# Patient Record
Sex: Female | Born: 1935 | Hispanic: No | Smoking: Former smoker
Health system: Southern US, Community
[De-identification: ages and names within clinical notes are randomized; demographics above are authoritative.]

## PROBLEM LIST (undated history)

## (undated) DIAGNOSIS — E079 Disorder of thyroid, unspecified: Secondary | ICD-10-CM

## (undated) DIAGNOSIS — E042 Nontoxic multinodular goiter: Secondary | ICD-10-CM

## (undated) DIAGNOSIS — E785 Hyperlipidemia, unspecified: Secondary | ICD-10-CM

## (undated) DIAGNOSIS — D369 Benign neoplasm, unspecified site: Secondary | ICD-10-CM

## (undated) DIAGNOSIS — Z923 Personal history of irradiation: Secondary | ICD-10-CM

## (undated) DIAGNOSIS — F419 Anxiety disorder, unspecified: Secondary | ICD-10-CM

## (undated) DIAGNOSIS — Z789 Other specified health status: Secondary | ICD-10-CM

## (undated) DIAGNOSIS — R894 Abnormal immunological findings in specimens from other organs, systems and tissues: Secondary | ICD-10-CM

## (undated) DIAGNOSIS — R42 Dizziness and giddiness: Secondary | ICD-10-CM

## (undated) DIAGNOSIS — I2510 Atherosclerotic heart disease of native coronary artery without angina pectoris: Secondary | ICD-10-CM

## (undated) DIAGNOSIS — M7120 Synovial cyst of popliteal space [Baker], unspecified knee: Secondary | ICD-10-CM

## (undated) DIAGNOSIS — M810 Age-related osteoporosis without current pathological fracture: Secondary | ICD-10-CM

## (undated) DIAGNOSIS — C801 Malignant (primary) neoplasm, unspecified: Secondary | ICD-10-CM

## (undated) DIAGNOSIS — N951 Menopausal and female climacteric states: Secondary | ICD-10-CM

## (undated) DIAGNOSIS — M5126 Other intervertebral disc displacement, lumbar region: Secondary | ICD-10-CM

## (undated) DIAGNOSIS — D171 Benign lipomatous neoplasm of skin and subcutaneous tissue of trunk: Secondary | ICD-10-CM

## (undated) DIAGNOSIS — M479 Spondylosis, unspecified: Secondary | ICD-10-CM

## (undated) DIAGNOSIS — I10 Essential (primary) hypertension: Secondary | ICD-10-CM

## (undated) DIAGNOSIS — M519 Unspecified thoracic, thoracolumbar and lumbosacral intervertebral disc disorder: Secondary | ICD-10-CM

## (undated) DIAGNOSIS — J329 Chronic sinusitis, unspecified: Secondary | ICD-10-CM

## (undated) HISTORY — PX: FINGER CONTRACTURE RELEASE: SHX637

## (undated) HISTORY — PX: CORONARY ANGIOPLASTY: SHX604

## (undated) HISTORY — DX: Benign lipomatous neoplasm of skin and subcutaneous tissue of trunk: D17.1

## (undated) HISTORY — PX: OTHER SURGICAL HISTORY: SHX169

## (undated) HISTORY — PX: CARPAL TUNNEL RELEASE: SHX101

## (undated) HISTORY — DX: Abnormal immunological findings in specimens from other organs, systems and tissues: R89.4

## (undated) HISTORY — DX: Menopausal and female climacteric states: N95.1

## (undated) HISTORY — DX: Hyperlipidemia, unspecified: E78.5

## (undated) HISTORY — DX: Benign neoplasm, unspecified site: D36.9

## (undated) HISTORY — DX: Spondylosis, unspecified: M47.9

## (undated) HISTORY — DX: Atherosclerotic heart disease of native coronary artery without angina pectoris: I25.10

## (undated) HISTORY — DX: Other intervertebral disc displacement, lumbar region: M51.26

## (undated) HISTORY — DX: Chronic sinusitis, unspecified: J32.9

## (undated) HISTORY — DX: Age-related osteoporosis without current pathological fracture: M81.0

## (undated) HISTORY — DX: Disorder of thyroid, unspecified: E07.9

## (undated) HISTORY — DX: Synovial cyst of popliteal space (Baker), unspecified knee: M71.20

## (undated) HISTORY — DX: Unspecified thoracic, thoracolumbar and lumbosacral intervertebral disc disorder: M51.9

## (undated) HISTORY — DX: Nontoxic multinodular goiter: E04.2

---

## 1988-05-29 DIAGNOSIS — M5126 Other intervertebral disc displacement, lumbar region: Secondary | ICD-10-CM

## 1988-05-29 DIAGNOSIS — M5136 Other intervertebral disc degeneration, lumbar region: Secondary | ICD-10-CM

## 1988-05-29 DIAGNOSIS — M51369 Other intervertebral disc degeneration, lumbar region without mention of lumbar back pain or lower extremity pain: Secondary | ICD-10-CM

## 1988-05-29 HISTORY — DX: Other intervertebral disc degeneration, lumbar region: M51.36

## 1988-05-29 HISTORY — DX: Other intervertebral disc displacement, lumbar region: M51.26

## 1988-05-29 HISTORY — DX: Other intervertebral disc degeneration, lumbar region without mention of lumbar back pain or lower extremity pain: M51.369

## 2004-11-09 ENCOUNTER — Other Ambulatory Visit: Admission: RE | Admit: 2004-11-09 | Discharge: 2004-11-09 | Payer: Self-pay | Admitting: Obstetrics and Gynecology

## 2005-05-29 DIAGNOSIS — I2510 Atherosclerotic heart disease of native coronary artery without angina pectoris: Secondary | ICD-10-CM

## 2005-05-29 HISTORY — DX: Atherosclerotic heart disease of native coronary artery without angina pectoris: I25.10

## 2005-07-26 ENCOUNTER — Other Ambulatory Visit: Admission: RE | Admit: 2005-07-26 | Discharge: 2005-07-26 | Payer: Self-pay | Admitting: Obstetrics and Gynecology

## 2005-10-19 ENCOUNTER — Emergency Department (HOSPITAL_COMMUNITY): Admission: EM | Admit: 2005-10-19 | Discharge: 2005-10-19 | Payer: Self-pay | Admitting: Emergency Medicine

## 2005-10-19 ENCOUNTER — Inpatient Hospital Stay (HOSPITAL_COMMUNITY): Admission: EM | Admit: 2005-10-19 | Discharge: 2005-10-21 | Payer: Self-pay | Admitting: Emergency Medicine

## 2005-10-25 ENCOUNTER — Inpatient Hospital Stay (HOSPITAL_COMMUNITY): Admission: EM | Admit: 2005-10-25 | Discharge: 2005-10-27 | Payer: Self-pay | Admitting: Emergency Medicine

## 2005-11-14 ENCOUNTER — Other Ambulatory Visit: Admission: RE | Admit: 2005-11-14 | Discharge: 2005-11-14 | Payer: Self-pay | Admitting: Family Medicine

## 2006-05-25 ENCOUNTER — Other Ambulatory Visit: Admission: RE | Admit: 2006-05-25 | Discharge: 2006-05-25 | Payer: Self-pay | Admitting: Obstetrics and Gynecology

## 2006-09-13 ENCOUNTER — Other Ambulatory Visit: Admission: RE | Admit: 2006-09-13 | Discharge: 2006-09-13 | Payer: Self-pay | Admitting: Obstetrics and Gynecology

## 2007-06-18 ENCOUNTER — Encounter: Admission: RE | Admit: 2007-06-18 | Discharge: 2007-06-18 | Payer: Self-pay | Admitting: Surgery

## 2007-10-17 ENCOUNTER — Other Ambulatory Visit: Admission: RE | Admit: 2007-10-17 | Discharge: 2007-10-17 | Payer: Self-pay | Admitting: Obstetrics and Gynecology

## 2008-01-06 ENCOUNTER — Encounter: Admission: RE | Admit: 2008-01-06 | Discharge: 2008-01-06 | Payer: Self-pay | Admitting: Surgery

## 2008-07-08 ENCOUNTER — Encounter: Admission: RE | Admit: 2008-07-08 | Discharge: 2008-07-08 | Payer: Self-pay | Admitting: Surgery

## 2009-01-07 HISTORY — PX: EYE SURGERY: SHX253

## 2009-01-21 HISTORY — PX: EYE SURGERY: SHX253

## 2009-07-05 ENCOUNTER — Encounter: Admission: RE | Admit: 2009-07-05 | Discharge: 2009-07-05 | Payer: Self-pay | Admitting: Family Medicine

## 2009-08-09 ENCOUNTER — Other Ambulatory Visit: Admission: RE | Admit: 2009-08-09 | Discharge: 2009-08-09 | Payer: Self-pay | Admitting: Obstetrics and Gynecology

## 2009-11-12 ENCOUNTER — Encounter: Admission: RE | Admit: 2009-11-12 | Discharge: 2009-11-12 | Payer: Self-pay | Admitting: Obstetrics and Gynecology

## 2010-06-19 ENCOUNTER — Encounter: Payer: Self-pay | Admitting: Cardiology

## 2010-06-19 ENCOUNTER — Encounter: Payer: Self-pay | Admitting: Family Medicine

## 2010-06-24 ENCOUNTER — Other Ambulatory Visit: Payer: Self-pay | Admitting: Family Medicine

## 2010-06-24 DIAGNOSIS — E041 Nontoxic single thyroid nodule: Secondary | ICD-10-CM

## 2010-07-08 ENCOUNTER — Ambulatory Visit
Admission: RE | Admit: 2010-07-08 | Discharge: 2010-07-08 | Disposition: A | Discharge status: Home / Self Care | Payer: MEDICARE | Source: Ambulatory Visit | Attending: Family Medicine | Admitting: Family Medicine

## 2010-07-08 DIAGNOSIS — E041 Nontoxic single thyroid nodule: Secondary | ICD-10-CM

## 2010-08-31 ENCOUNTER — Inpatient Hospital Stay (INDEPENDENT_AMBULATORY_CARE_PROVIDER_SITE_OTHER)
Admission: RE | Admit: 2010-08-31 | Discharge: 2010-08-31 | Disposition: A | Discharge status: Home / Self Care | Payer: Medicare Other | Source: Ambulatory Visit | Attending: Family Medicine | Admitting: Family Medicine

## 2010-08-31 DIAGNOSIS — L98499 Non-pressure chronic ulcer of skin of other sites with unspecified severity: Secondary | ICD-10-CM

## 2010-10-14 NOTE — H&P (Signed)
NAME:  Margaret Hale, Margaret Hale           ACCOUNT NO.:  [id number]   MEDICAL RECORD NO.:  [id number]           PATIENT TYPE:   LOCATION:                               FACILITY:  [facility]   PHYSICIAN:  Armanda Magic, M.D.     DATE OF BIRTH:  1935/03/23   DATE OF ADMISSION:  10/19/2005  DATE OF DISCHARGE:                                HISTORY & PHYSICAL   CHIEF COMPLAINT:  Chest pain.   HISTORY OF PRESENT ILLNESS:  Margaret Hale is a 76 year old Caucasian  female with a history of hypertension and no known cardiac history.  She  presented to the [facility] Cardiology office in April per the referral of Dr.  Laurann Montana for nonexertional midsternal chest pain that lasted for 10-15  minutes.  The pain was described as a deep ache that radiated from deep  within her bones to her throat.  There were no other associated symptoms  with the chest pain in that the patient denied shortness of breath, nausea,  vomiting, and diaphoresis.  A stress Cardiolite was performed, yielding  abnormal results.  The patient continued to have intermittent chest pain and  presented back to the [facility] Cardiology office on today complaining of  intermittent midsternal chest pain occurring mainly at night, lasting for 1  to 1-1/2 hours.  The chest pain was similar in quality and character as the  previous pain, with a longer duration.  She was actively having chest pain  while in the office today and admitted to having eaten a meal.  The patient  was referred to the [facility] Emergency Department for admittance to the  hospital for precardiac catheterization blood work and a subsequent  inpatient cardiac catheterization that has been scheduled for the a.m.  The  patient, upon arrival to the emergency department, admitted to being chest  pain free; however, she also admitted that her chest pain is still  intermittent.  She denied shortness of breath, nausea, vomiting, as well as  diaphoresis.  The patient has had symptoms of  diaphoresis over the past few  months; however, she believes it is the result of the discontinuation of the  estrogen that she discontinued in January of this year.  A 2-D echo in May  2007 revealed normal LV systolic function and mild LVH, mild PR, and mild  left atrial enlargement.   PAST MEDICAL HISTORY:  1.  Hypertension.  2.  Tobacco abuse.   ALLERGIES:  No known drug allergies.   MEDICATIONS:  1.  Norvasc 10 mg daily.  2.  Avelox 300/12.5 mg daily.  3.  Calcium 500 mg three times daily.   PAST SURGICAL HISTORY:  None.   FAMILY HISTORY:  1.  Father deceased, old age.  2.  Mother deceased when the patient was very young from an infection.  3.  The patient has no siblings.  4.  Two children - both are healthy.   SOCIAL HISTORY:  Margaret Hale is a widow and has two adult children, both  of whom are healthy.  The patient does not work.  She admits to tobacco use  in  that she smokes one half pack of cigarettes per day and has for an  unknown amount of yearsand has caffeine intake.   REVIEW OF SYSTEMS:  All other systems reviewed are negative other than what  is stated in the HPI.   PHYSICAL EXAMINATION:  GENERAL:  A 76 year old female, pleasant and  cooperative, no acute distress.  VITAL SIGNS:  Temperature 97.6, pulse 66, blood pressure 135/64,  respirations 18, O2 saturations 95% over room air.  HEENT:  Unremarkable.  NECK:  Supple without JVD or carotid bruits bilaterally.  Carotid upstrokes.  No masses.  No aortic or iliac bruits.  EXTREMITIES:  No peripheral edema.  DP and PT pulses 2+/2, bilaterally.  SKIN:  Warm and dry without rashes or lesions.  NEUROLOGIC:  Alert and oriented x3.  No focal deficits.  PSYCHIATRIC:  Normal mood and affect.   LABORATORY DATA:  BMET, CBC with differential, PT, INR, PTT, magnesium,  point of care enzymes x1, cardiac enzymes are pending.   EKG; 80 beats per minute without ST or T-wave abnormalities.   ASSESSMENT:  1.  Chest  pain, intermittent.  2.  Abnormal stress Cardiolite.  3.  Under the service of Dr. Armanda Magic with a diagnosis of chest pain.  4.  Start SQ Lovenox every 12 hours per pharmacy protocol.  5.  Start IV nitroglycerin at 10 mcg per minute.  Titrate for chest pain.      Hold for SBP less than 110 mmHg.  6.  Initiate cardiac p.r.n. orders.  7.  Cardiac catheterization in the a.m. at 10:30.  Precardiac      catheterization lab work is pending.  Precardiac catheterization orders      have been written and placed in the chart.  Cardiac catheterization      procedure, risks, and potential complications including MI, CVA, death,      contrast dye allergy, decreased renal function within the first 48      hours, or pseudoaneurysm were explained to the patient by Dr. Mayford Knife.      The patient admits to full understanding of the information and wishes      to proceed with the cardiac catheterization procedure as scheduled at      10:30 a.m. in the morning.      Tylene Fantasia, [place]      Armanda Magic, M.D.  Electronically Signed    RDM/MEDQ  D:  10/19/2005  T:  10/19/2005  Job:  [id number]   cc:   Stacie Acres. Cliffton Asters, M.D.  Fax: [phone]

## 2010-10-14 NOTE — Consult Note (Signed)
Margaret Hale, Margaret Hale           ACCOUNT NO.:  [id number]   MEDICAL RECORD NO.:  [id number]          PATIENT TYPE:  INP   LOCATION:  4742                         FACILITY:  [facility]   PHYSICIAN:  James L. Malon Kindle., M.D.DATE OF BIRTH:  22-Apr-1935   DATE OF CONSULTATION:  10/26/2005  DATE OF DISCHARGE:                                   CONSULTATION   GASTROENTEROLOGY CONSULTATION   REASON FOR CONSULTATION:  Chest pain felt to be noncardiac.   HISTORY OF PRESENT ILLNESS:  The patient is a 76 year old woman who has had  no real history of coronary disease until recently.  As an outpatient she  had complained of a vague substernal chest pain, a pressure-like pain, and  had a positive Cardiolite test.  She had an outpatient catheterization  showing right coronary artery stenosis and a bare metal stent was placed.  This was on Oct 19, 2005.  She then was on Plavix for three weeks.  She was  discharged home after this and yesterday she developed fairly sudden onset  of left sided chest pain.  She describes this as aching, deep pain.  It  appears it was in her left chest wall and sometimes anteriorly, sometimes  posteriorly, sometimes it seemed to start and sometimes from the back.  Careful history taken showed that she had no pleuritic component to it, no  worsening with taking a deep breath, moving, twisting, laying down, standing  up, walking, etc.  It just became steadily worse and she came to the  hospital. The possibility of pulmonary embolus was raised and CT scan  angiogram of the chest was performed.  A small nodule in the left lung field  had been previously seen on chest x-ray.  A CT scan showed no signs at all  of a pulmonary embolus with what appeared to be old granulomatous disease  with atherosclerosis.  The right lobe of the thyroid was enlarged but there  were no findings to explain the patient's chest pain.  She denies any  problem with eating. She says the pain is there  constantly.  She eats a  meal, drinks hot and cold liquids, just finished her dinner tonight with no  worsening of the pain.  She has no nausea.  She has not vomited.  She has no  heartburn, no indigestion.  Her bowel movements do not make the pain better  or worse.  The pain does not radiate down into her abdomen.  She is due for  gallbladder ultrasound tomorrow.   Other pertinent tests on this admission have revealed blood gas with venous  sample with pCO2 of 44 and a normal pH.  Hemoglobin and white blood cell  count were normal.  Pro Time also was normal.  The patient's labs revealed  normal liver function tests, normal cardiac markers.  Her electrocardiogram  was unchanged and the feeling is that this is not a cardiac chest pain.   CURRENT MEDICATIONS:  1.  Aspirin 325 mg daily.  2.  Plavix 75 mg daily.  3.  Zocor 20 daily.  4.  Colace 200 daily.  5.  Avapro 300 daily.  6.  Hydrochlorothiazide 12.5 daily.  7.  Norvasc 10 daily.  8.  Calcium 500 t.i.d.  9.  Lovenox 75 q.12h. per Lovenox protocol.  10. Nitroglycerin.  11. Intravenous morphine.  12. Tylenol.  13. Senokot.   ALLERGIES:  The patient has no drug allergies.   PAST MEDICAL HISTORY:  Her medical history is remarkable only for  hypertension.  She has no other chronic problems.   PAST SURGICAL HISTORY:  She has never had any surgery.   FAMILY HISTORY:  Her mother died of some sort of infection right after the  war.  She did not really see her, was separated from her mother and father  during World War II in [place] and did not see her father after that.  He was  missing for a while.  She lived with relatives and he reappeared in another  part of the country but she is not really aware of his medical history.  She  has a son in [place] who is healthy.   SOCIAL HISTORY:  The patient is widowed.  She was married for a number of  years.  Her husband died about 9 years ago and she started smoking.  She  denied  smoking up until that time.  She quit five days ago.  She does not  drink.  She was originally from [place].  She lived around with different  relatives during World War II and eventually was able to immigrate here.  She has a son who lives here in [place].  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   PHYSICAL EXAMINATION:  VITAL SIGNS:  Patient is afebrile.  Vital signs are  normal.  GENERAL APPEARANCE:  Oriented x3 white female in no acute distress.  HEENT:  Eyes clear, nonicteric.  LUNGS:  Clear anteriorly and posteriorly.  I carefully examined the left  chest wall and there is no signs of a vesicular rash.  The area of the pain  appears to be around the left chest wall from the front to the back.  I hear  no rubs in this area.  HEART:  Regular rate and rhythm without murmurs or gallops.  ABDOMEN:  Soft, nontender.   ASSESSMENT:  This pain is very atypical for any gastrointestinal pain.  The  patient has no heartburn, no indigestion, is able to eat and swallow without  any increase in her pain.  She has had no change in her bowel movements.  I  wonder if this in some way is neurological.   PLAN:  Agree with gallbladder ultrasound.  If this is negative we may  consider an endoscopy or something else, however, I would at this point go  ahead and place her empirically on proton pump inhibitor's b.i.d. and see if  that helps.  Dr. Matthias Hughs will check on the results of the test tomorrow.           ______________________________  Llana Aliment Malon Kindle., M.D.     Waldron Session  D:  10/26/2005  T:  10/27/2005  Job:  [id number]   cc:   Stacie Acres. Cliffton Asters, M.D.  Fax: [phone]

## 2010-10-14 NOTE — H&P (Signed)
NAME:  Margaret Hale, Margaret Hale NO.:  [id number]   MEDICAL RECORD NO.:  [id number]          PATIENT TYPE:  EMS   LOCATION:  MAJO                         FACILITY:  [facility]   PHYSICIAN:  Meade Maw, M.D.    DATE OF BIRTH:  [birth year]   DATE OF ADMISSION:  10/25/2005  DATE OF DISCHARGE:                                HISTORY & PHYSICAL   CHIEF COMPLAINT:  Chest pain.   HISTORY OF PRESENT ILLNESS:  Margaret Hale is a 76 year old female who is  admitted on Oct 19, 2005, for cardiac catheterization.  As an outpatient,  she complained of substernal chest pain and had a positive Cardiolite which  lead to her catheterization on Oct 19, 2005.  She was found to have a high  RCA stenosis and a bare metal stent was placed to this area with good  results.  It is unclear to me at this time the reason for the bare metal  stent use, however, the end result was excellent.  The patient was asked to  remain on Plavix for a total of three weeks.   Today, she developed left sided chest pain squeezing.  She did have some  radiation into her left shoulder but, otherwise, no associated symptoms.  She specifically tells me that this pain is different from her previous  angina with her previous admission.  She states that pain was mid sternal.   PAST MEDICAL HISTORY:  Hypertension.   SOCIAL HISTORY:  She quit smoking five days ago.  She is a widow.  She has  two children.  She is from [place].   FAMILY HISTORY:  Father died of old age.  Mother died of some type of  infection.   ALLERGIES:  No known drug allergies.   MEDICATIONS:  Plavix 75 mg daily, Zocor 20 mg daily, Norvasc 10 mg daily,  Avalide 300/12.5 mg a day.  She tells me she did not start her aspirin as  indicated on discharge because she thought her Plavix bottle said not to mix  it with aspirin.   PHYSICAL EXAMINATION:  VITAL SIGNS:  Temperature 97.6, blood pressure 123/68, pulse 61,  respirations 20.  HEENT:  Within normal  limits.  Sclerae clear.  Conjunctivae normal.  Nares  without drainage.  NECK:  No carotid bruit or subclavian bruits, no JVD, no thyromegaly.  CHEST:  Clear to auscultation bilaterally, no wheezing or rhonchi.  HEART:  Regular rate and rhythm, no gross murmur.  There is no left chest  wall tenderness.  ABDOMEN:  Soft, good bowel sounds, nontender, nondistended, no masses, no  bruits.  EXTREMITIES:  No femoral bruits, no lower extremity edema, palpable PT  pulses bilaterally.  SKIN:  Warm and dry.  NEUROLOGICAL:  Cranial nerves 2-12 grossly intact.  Alert and oriented x 3.   LABORATORY DATA:  Sodium 138, potassium 4, BUN 30, creatinine 0.9.  Hemoglobin 13.1, hematocrit 37.8, white count 6.  Point of care markers  negative x1.  Protime 13.5, INR 1.  EKG normal sinus rhythm, rate at 74, non-  acute.   ASSESSMENT AND PLAN:  1.  Chest pain.  2.  Known coronary artery disease status post recent bare metal stent to the      right coronary artery on Oct 19, 2005, under the care of Dr. Verdis Prime.  3.  8 mm right middle lobe nodule.  She is to have an outpatient CT scan      today but did not make it to the appointment secondary to her chest      pain.  4.  Hypertension, treated.  5.  Hyperlipidemia, treated.  6.  Former tobacco use, but she only quit five days ago, therefore, we will      go ahead and order a smoking cessation consult.   At this point, we will admit her overnight, check serial enzymes and serial  EKGs.  She was not on a beta blocker prior to admission and at this point,  there is no indication to begin a beta blocker at this time.  Because of her  chest pain, however, we will go ahead and perform a CT of the chest to  evaluate for pulmonary embolism and also to evaluate the right lung nodule.  The patient was seen and examined, assessment and plan was performed by Dr.  Meade Maw.      Guy Franco, P.A.      Meade Maw, M.D.  Electronically  Signed    LB/MEDQ  D:  10/25/2005  T:  10/25/2005  Job:  [id number]

## 2010-10-14 NOTE — Discharge Summary (Signed)
Margaret Hale, Margaret Hale NO.:  [id number]   MEDICAL RECORD NO.:  [id number]          PATIENT TYPE:  INP   LOCATION:  4742                         FACILITY:  [facility]   PHYSICIAN:  Meade Maw, M.D.    DATE OF BIRTH:  04/01/1935   DATE OF ADMISSION:  10/25/2005  DATE OF DISCHARGE:  10/27/2005                                 DISCHARGE SUMMARY   DISCHARGE DIAGNOSES:  1.  Chest pain, Coronary artery  disease status post bare metal stent to the      right coronary artery Oct 19, 2005.  2.  Hypertension.  3.  Former smoker.  4.  Right thyroid nodule with normal TSH.  5.  Long-term medication use.   HOSPITAL COURSE:  Margaret Hale is a 76 year old female who underwent bare  metal stent placement to the right coronary artery on Oct 15, 2005.  She was  to remain on Plavix for three weeks.  She had not been as directed on her  discharge instruction sheet because she said the Plavix bottle said not to  mix with aspirin.  She was admitted with a left-sided squeezing sensation  that was dissimilar to her previous coronary pain.   She was admitted to [facility].  Her cardiac enzymes  were negative and her EKG was nonacute.   She does have a known 8 mm right middle lobe nodule by chest x-ray and she  had an appointment for outpatient CT scan on the day of admission but of  course, missed this appointment secondary to her hospitalization.  While she  was here, we did do a CT of the chest and the skin showed no pulmonary  embolism and no sign of nodule in her chest.  I did review this with the  radiologist.  She does have a right thyroid lobe nodule/enlargement.  She  states that she has had thyroid problems in the past but is not on  medications.  As part of her work-up, we did obtain a gastroenterology  consult through the efforts of Dr. Carman Ching.  Gallbladder ultrasound  was ordered and was normal.  Recommendations were to place the patient on an  empiric PPI b.i.d. and to have the patient follow up with Dr. Randa Evens as an  outpatient.   On October 27, 2005, she was discharged to home in stable condition.  I had  given her approximately 24 hours worth of IV Toradol which seemed to  alleviate her pain.  She was discharged to home in stable condition on the  following medications.   DISCHARGE MEDICATIONS:  1.  Enteric coated aspirin 325 mg a day.  2.  Plavix 75 mg a day for a total of three weeks then as previously      instructed.  3.  Zocor 20 mg nightly.  4.  Avalide 300/12.5 mg daily.  5.  Norvasc10 mg a day.  6.  Her new prescription is Protonix 40 mg p.o. b.i.d. and I have given her      three refills for this.   She is to increase her activity slowly, remain on  a low fat diet, follow up  with Armanda Magic, M.D., in two weeks.  I will make this appointment for  her.  She has a follow-up appointment to see Dr. Carman Ching on November 22, 2005, at 9 a.m.   ADDENDUM:  The patient will need to follow up with Dr. Cliffton Asters regarding her  thyroid nodule.      Guy Franco, P.A.      Meade Maw, M.D.  Electronically Signed    LB/MEDQ  D:  10/27/2005  T:  10/27/2005  Job:  [id number]   cc:   Stacie Acres. Cliffton Asters, M.D.  Fax: [phone]   Armanda Magic, M.D.  Fax: [phone]   Llana Aliment. Malon Kindle., M.D.  Fax: [phone]

## 2010-10-14 NOTE — Cardiovascular Report (Signed)
NAME:  Margaret Hale, CALE NO.:  [id number]   MEDICAL RECORD NO.:  [id number]          PATIENT TYPE:  INP   LOCATION:  6523                         FACILITY:  [facility]   PHYSICIAN:  Lyn Records, M.D.   DATE OF BIRTH:  1934/06/18   DATE OF PROCEDURE:  10/20/2005  DATE OF DISCHARGE:                              CARDIAC CATHETERIZATION   INDICATION:  The patient has been having class IV angina (angina at rest),  cardiac catheterization this morning demonstrated a borderline ostial left  main and a high-grade mid right coronary lesion.  I was consulted to do  intravascular ultrasound on the left main and if it demonstrated to be a  hemodynamically insignificant lesion to then stent the right coronary.   PROCEDURE PERFORMED:  1.  Left main intravascular ultrasound.  2.  Bare metal stent mid right coronary.   DESCRIPTION:  Dr. Mayford Knife cath'd the patient this morning.  Using the same  sheath that was placed by her we used a 6-French left Judkins guide  catheter, gave 50 units per kg of IV heparin, documented an ACT greater than  300 and performed left main intravascular ultrasound.  We used an Conservator, museum/gallery through this guide catheter.  We gave 200 mcg of  intracoronary nitroglycerin.  We used an Atlantis Pro intravascular  ultrasound catheter and were able to demonstrate that the left main percent  diameter stenosis was 35% and cross-sectional area stenosis was 51%.  We  felt that this was an insignificant left main stenosis and proceeded with  right coronary PCI.   We gave a double bolus followed by an infusion of Integrilin.  The ACT had  been previously documented to be greater than 300.  We used a Judkins right  6-French guide catheter.  The same [facility] guide wire was used to  obtain access across the stenosis in the right coronary.  We predilated with  a 2.5 x 12 mm long Maverick balloon and we deployed a 16 x 3.5 Liberte  stent.  The stent  moved slightly during the deployment and I was not happy  that we had captured the entire lesion distal margin.  We then placed a  second overlapping 12 mm long x 3.5 mm Liberte stent.  We postdilated with a  15 x 4.0 Quantum balloon.  Two balloon inflations at 12 atmospheres were  performed.  The patient tolerated the procedure without complications.   Angio-Seal arteriotomy closure was performed without complications.  Angiographic demonstration of appropriate anatomy was done prior to  deployment.   CONCLUSIONS:  1.  Mild to moderate left main disease with diameter stenosis of 35%      documented by intravascular ultrasound.  2.  Successful bare metal stent of the mid right coronary with reduction in      stenosis from 99% to less than 10% with TIMI grade 3 flow.  There are      two overlapping 3.5 Liberte stents placed.   PLAN:  1.  Plavix for at least 3 weeks.  2.  Continued Integrilin x18 hours.  Remainder of care per  Dr. Armanda Magic.      Lyn Records, M.D.  Electronically Signed     HWS/MEDQ  D:  10/20/2005  T:  10/20/2005  Job:  [id number]   cc:   Armanda Magic, M.D.  Fax: [phone]   Stacie Acres. Cliffton Asters, M.D.  Fax: [phone]

## 2010-10-14 NOTE — Discharge Summary (Signed)
NAME:  Margaret Hale, Margaret Hale           ACCOUNT NO.:  [id number]   MEDICAL RECORD NO.:  [id number]          PATIENT TYPE:  INP   LOCATION:  6523                         FACILITY:  [facility]   PHYSICIAN:  Armanda Magic, M.D.     DATE OF BIRTH:  1935-03-25   DATE OF ADMISSION:  10/19/2005  DATE OF DISCHARGE:  10/21/2005                                 DISCHARGE SUMMARY   ADMISSION DIAGNOSIS:  Class IV angina at rest.   DISCHARGE DIAGNOSES:  1.  Class IV angina at rest status post cardiac catheterization with a bare-      metal stent to the mid right coronary artery with a reduction in      stenosis from 99% to less than 10% with TIMI grade III flow.  2.  Hypertension.  3.  Tobacco abuse with smoking cessation recommended.  4.  Chronic obstructive pulmonary disease with a 8-mm nodular right density      in the mid lung.  Outpatient CT of the chest pending for further      evaluation of lung nodule.  5.  Hyperlipidemia.   PROCEDURE:  Elective left heart diagnostic cardiac catheterization and  coronary angiography with left ventriculography on Oct 20, 2005, with the  following results:  40%-60% stenosis in the left main, a 30%-40% in the  first diagonal, a 50% lesion in the LAD with systolic compression and 95%  stenotic lesion in the RCA.  Also on Oct 20, 2005, Dr. Verdis Prime was  consulted to do intravascular ultrasound on the left main and if it was  demonstrated to be a hemodynamically insignificant lesion to then stent the  right coronary artery.  There was mild-to-moderate left main disease with a  diameter stenosis of 35%, documented by intravascular ultrasound as a result  the patient was found to have single-vessel coronary artery disease status  post successful bare-metal stenting of the mid right coronary artery with  reduction in stenosis from 99 to less than 10% with TIMI grade III flow.  There are two overlapping 3.5 liberty stents placed.  The plan for the  patient was to place  patient on Plavix 75 mg daily for at least 3 weeks,  Integrilin for 18 hours, and have the remainder of the patient's care per  Dr. Armanda Magic.  The patient tolerated the procedure without  complications.  Angio-Seal arteriotomy closure was performed without  complications.  Angiographic demonstration of appropriate anatomy was done  prior to deployment.   HOSPITAL COURSE:  Margaret Hale is a 76 year old Caucasian female with a  history of hypertension and no known cardiac history.  She was admitted to  the hospital for class IV angina at rest status post an abnormal stress  Cardiolite.  The patient denied any other associated symptoms such as  shortness of breath, nausea, vomiting, and diaphoresis.  She was admitted to  the [facility] for elective diagnostic cardiac catheterization,  which was scheduled to be performed on Oct 20, 2005.  Upon admission,  laboratory data was obtained.  For example, point of care cardiac enzymes  were obtained and were negative x3  with a peak troponin of less than 0.05.  Serial cardiac enzymes were obtained with a peak troponin of 0.03.  On  admission EKG revealed normal sinus rhythm with a ventricular rate of 62  beats per minute with nonspecific abnormality.  There was no old tracing in  which to compare this EKG.  Portable chest x-ray revealed emphysema with  nodular opacity in the right mid lung a chest CT was recommended for further  evaluation of the right lung mass.  The patient believed that her chest pain  was secondary to hormones that she had been taking for years and had  recently discontinued; however, the diagnostic cardiac catheterization  revealed a 90% stenotic lesion in the mid RCA with nonobstructive coronary  disease in the left main, LAD, and first diagonal coronary arteries.  Dr.  Verdis Prime was consulted to perform a left main intravascular ultrasound,  which revealed mild-to-moderate left main disease with a diameter of  35%  stenosis documented by IVUS.  As a result, the patient was found to have  single-vessel coronary artery disease with successful bare-metal stenting of  the mid right coronary artery with reduction in the stenosis from 99% to  less than 10% with TIMI grade III flow.  There was two overlapping 3.5  liberty stents placed.  The patient was started on Plavix 75 mg orally, per  day and will be continued for at least the next 3 weeks.  The patient was  continued on Integrilin for 18 hours.  The procedure was tolerated well by  the patient with minimal blood loss.  A smoking cessation consult was  obtained.  The remainder of the patient's stay was uneventful in that the  patient had no further complaints of chest pain during this admission.  She  was discharged to home in stable condition on Oct 21, 2005, on both Aspirin  and Plavix.   LABORATORY DATA:  Sodium 141, potassium 3.5, chloride 106, CO2 29, glucose  100, BUN 14, creatinine 0.7, calcium 9.4, FLP:  Total cholesterol 173,  triclyceride 285, HDL 36, LDL 80.  Point of care cardiac enzymes are as  follow:  Myoglobin 84.5, 68.1, 186, respectively; CK-MB 1.7, 1.7, and 2.5,  respectively; troponin I less than 0.05, and less than 0.05, respectively,  BNP 40.0.  Serial cardiac enzymes:  CK total 109, 79, 70, respectively; CK-  MB 2.6, 1.8, and 1.7, respectively; troponin I 0.02 and 0.03, respectively.  White blood count 6.1, hemoglobin 13.2, hematocrit 38.3, and platelets 187.  PT 12.7, INR 0.9, PTT 27.   X-RAYS:  Chest x-rays, single-view, Oct 19, 2005, emphysema with a nodular  opacity in the right mid lung.  Recommend chest CT for further evaluation.   EKG:  1.  EKG, Oct 19, 2005, normal sinus rhythm with a ventricular rate of 62      beats per minute with nonspecific ST abnormality.  2.  EKG, Oct 20, 2005, sinus bradycardia with a ventricular rate of 52 beats     per minute with nonspecific ST abnormality.  This represents no       significant change when compared with the EKG on Oct 19, 2005.   CONDITION ON DISCHARGE:  Margaret Hale was discharged to home in stable  condition.  She ambulated prior to discharge without any complaint of chest  pain, shortness of breath, nausea, vomiting, dizziness, diaphoresis or  syncope.  Her groin was in stable condition without bleeding, oozing,  hematoma, or pseudoaneurysm.  DISCHARGE MEDICATIONS:  1.  Plavix 75 mg for 30 days, this represented a new prescription and a      prescription was given with refills.  2.  Coated Aspirin 325 mg daily, this represented a new medication for Mrs.      Gatlin.  3.  Norvasc 10 mg daily.  4.  Avalox 300/12. 5 mg daily.  5.  Calcium 500 mg three times daily.  6.  Zocor 20 mg 1 tablet daily, this represented a new prescription and a      prescription was given with refills.   DISCHARGE INSTRUCTIONS:  1.  The patient was instructed to follow a heart-healthy diet which included      low-salt, low-fat and a low-cholesterol diet.  2.  The patient was instructed to avoid driving for 48 hours.  The patient      was also instructed to avoid lifting greater than 10 pounds for 1 week.  3.  The patient was instructed to call Dr. Armanda Magic, with recurrent      chest pain.   FOLLOWUP:  1.  The patient was scheduled for a chest CT with contrast for further      evaluation of the lung mass at [facility] on Oct 25, 2005, at      2:30 p.m.  She was scheduled to call [phone], if she was unable to make      the scheduled appointment.  She was advised to avoid solid foods after      10:30 a.m. on the day of the chest CT      which was Oct 25, 2005.  She was also advised to call [facility]      in regard to insurance coverage.  2.  The patient was scheduled to follow up with Dr. Armanda Magic, at the      [facility] on November 06, 2005, at 1: 30 p.m.  She was advised      to call [phone], if she was unable to make the  scheduled appointment.      Tylene Fantasia, [place]      Armanda Magic, M.D.  Electronically Signed    RDM/MEDQ  D:  11/27/2005  T:  11/27/2005  Job:  [id number]   cc:   Armanda Magic, M.D.  Stac[place] Cliffton Asters, M.D.

## 2011-05-22 ENCOUNTER — Encounter: Payer: Self-pay | Admitting: *Deleted

## 2011-05-22 ENCOUNTER — Emergency Department (HOSPITAL_COMMUNITY)
Admission: EM | Admit: 2011-05-22 | Discharge: 2011-05-22 | Disposition: A | Discharge status: Home / Self Care | Payer: Medicare Other | Source: Home / Self Care | Attending: Emergency Medicine | Admitting: Emergency Medicine

## 2011-05-22 DIAGNOSIS — M7120 Synovial cyst of popliteal space [Baker], unspecified knee: Secondary | ICD-10-CM

## 2011-05-22 HISTORY — DX: Essential (primary) hypertension: I10

## 2011-05-22 MED ORDER — HYDROCODONE-IBUPROFEN 7.5-200 MG PO TABS: 1.0000 | ORAL_TABLET | Freq: Four times a day (QID) | ORAL | Status: AC | PRN

## 2011-05-22 NOTE — ED Provider Notes (Signed)
History     CSN: [id number]  Arrival date & time 05/22/11  1135   First MD Initiated Contact with Patient 05/22/11 1351      Chief Complaint  Patient presents with  . Joint Swelling    (Consider location/radiation/quality/duration/timing/severity/associated sxs/prior treatment) HPI Comments: Went to see my orthopedic doctor, they have told me to do surgery, and that my knee keeps licking fluid"..its still sore and hurting"  Patient is a 76 y.o. female presenting with knee pain. The history is provided by the patient.  Knee Pain This is a recurrent problem. The current episode started more than 1 week ago. The problem occurs constantly. The problem has been gradually worsening. Pertinent negatives include no headaches. The symptoms are aggravated by bending, standing and walking. The symptoms are relieved by heat. She has tried nothing for the symptoms. The treatment provided no relief.    Past Medical History  Diagnosis Date  . Hypertension     History reviewed. No pertinent past surgical history.  History reviewed. No pertinent family history.  History  Substance Use Topics  . Smoking status: Not on file  . Smokeless tobacco: Not on file  . Alcohol Use:     OB History    Grav Para Term Preterm Abortions TAB SAB Ect Mult Living                  Review of Systems  Constitutional: Negative for fever.  Musculoskeletal: Positive for joint swelling.  Neurological: Negative for weakness, numbness and headaches.    Allergies  Review of patient's allergies indicates not on file.  Home Medications   Current Outpatient Rx  Name Route Sig Dispense Refill  . AMLODIPINE BESYLATE 10 MG PO TABS Oral Take 10 mg by mouth daily.      . ASPIRIN 81 MG PO TABS Oral Take 81 mg by mouth daily.      Marland Kitchen CLONIDINE HCL 0.1 MG PO TABS Oral Take 0.1 mg by mouth 2 (two) times daily.      Marland Kitchen LOSARTAN POTASSIUM-HCTZ 100-12.5 MG PO TABS Oral Take 1 tablet by mouth daily.      Marland Kitchen  HYDROCODONE-IBUPROFEN 7.5-200 MG PO TABS Oral Take 1 tablet by mouth every 6 (six) hours as needed for pain. 30 tablet 0    BP 162/72  Pulse 66  Temp(Src) 97.8 F (36.6 C) (Oral)  Resp 20  SpO2 99%  Physical Exam  Nursing note and vitals reviewed. Constitutional: She appears well-developed and well-nourished.  Musculoskeletal: She exhibits tenderness.       Right shoulder: She exhibits tenderness and swelling.       Left knee: She exhibits decreased range of motion and swelling. She exhibits no effusion, no ecchymosis, no laceration and no erythema. tenderness found.       Legs: Skin: Skin is warm. No rash noted. She is not diaphoretic. No erythema.    ED Course  Procedures (including critical care time)  Labs Reviewed - No data to display No results found.   1. Baker's cyst of knee       MDM  L posterior L knee- known recurrent bakers cyst pains and previous aspirations and local injections- seen last week by ortho.        Jimmie Molly, MD 05/22/11 [phone]

## 2011-05-22 NOTE — ED Notes (Signed)
Pt  Has    Swelling   Behind  l  Knee          Seen           By  [facility]thoped[facility]   Last  Week  And       Was rx   Prednisone  Which  She  Has  Completed      She  Has  Pain l  Leg  And  Behind  The  l  Knee   She  Has  Had  Fluid  Behind  The  Knee  In past   She  denys  specefic  Recent  injury

## 2011-06-13 ENCOUNTER — Other Ambulatory Visit: Payer: Self-pay | Admitting: Family Medicine

## 2011-06-13 ENCOUNTER — Other Ambulatory Visit: Payer: Self-pay | Admitting: Obstetrics and Gynecology

## 2011-06-13 DIAGNOSIS — Z1231 Encounter for screening mammogram for malignant neoplasm of breast: Secondary | ICD-10-CM

## 2011-06-13 DIAGNOSIS — E049 Nontoxic goiter, unspecified: Secondary | ICD-10-CM

## 2011-06-21 ENCOUNTER — Ambulatory Visit
Admission: RE | Admit: 2011-06-21 | Discharge: 2011-06-21 | Disposition: A | Discharge status: Home / Self Care | Payer: Medicare Other | Source: Ambulatory Visit | Attending: Family Medicine | Admitting: Family Medicine

## 2011-06-21 ENCOUNTER — Ambulatory Visit
Admission: RE | Admit: 2011-06-21 | Discharge: 2011-06-21 | Disposition: A | Discharge status: Home / Self Care | Payer: Medicare Other | Source: Ambulatory Visit | Attending: Obstetrics and Gynecology | Admitting: Obstetrics and Gynecology

## 2011-06-21 ENCOUNTER — Other Ambulatory Visit: Payer: Self-pay | Admitting: Family Medicine

## 2011-06-21 ENCOUNTER — Other Ambulatory Visit: Payer: Medicare Other

## 2011-06-21 ENCOUNTER — Other Ambulatory Visit: Payer: Self-pay | Admitting: Gastroenterology

## 2011-06-21 DIAGNOSIS — Z1231 Encounter for screening mammogram for malignant neoplasm of breast: Secondary | ICD-10-CM

## 2011-06-21 DIAGNOSIS — E049 Nontoxic goiter, unspecified: Secondary | ICD-10-CM

## 2011-06-22 ENCOUNTER — Other Ambulatory Visit: Payer: Self-pay | Admitting: Family Medicine

## 2011-06-22 ENCOUNTER — Ambulatory Visit: Payer: Medicare Other

## 2011-06-22 DIAGNOSIS — R9389 Abnormal findings on diagnostic imaging of other specified body structures: Secondary | ICD-10-CM

## 2011-06-23 ENCOUNTER — Other Ambulatory Visit: Payer: Self-pay | Admitting: Obstetrics and Gynecology

## 2011-06-23 DIAGNOSIS — R928 Other abnormal and inconclusive findings on diagnostic imaging of breast: Secondary | ICD-10-CM

## 2011-06-27 ENCOUNTER — Other Ambulatory Visit: Payer: Medicare Other

## 2011-06-28 ENCOUNTER — Ambulatory Visit
Admission: RE | Admit: 2011-06-28 | Discharge: 2011-06-28 | Disposition: A | Discharge status: Home / Self Care | Payer: Medicare Other | Source: Ambulatory Visit | Attending: Gastroenterology | Admitting: Gastroenterology

## 2011-06-29 ENCOUNTER — Ambulatory Visit
Admission: RE | Admit: 2011-06-29 | Discharge: 2011-06-29 | Disposition: A | Discharge status: Home / Self Care | Payer: Medicare Other | Source: Ambulatory Visit | Attending: Family Medicine | Admitting: Family Medicine

## 2011-06-29 DIAGNOSIS — R9389 Abnormal findings on diagnostic imaging of other specified body structures: Secondary | ICD-10-CM

## 2011-06-29 MED ORDER — IOHEXOL 300 MG/ML  SOLN
75.0000 mL | Freq: Once | INTRAMUSCULAR | Status: AC | PRN
  Administered 2011-06-29: 75 mL via INTRAVENOUS

## 2011-07-05 ENCOUNTER — Ambulatory Visit
Admission: RE | Admit: 2011-07-05 | Discharge: 2011-07-05 | Disposition: A | Discharge status: Home / Self Care | Payer: Medicare Other | Source: Ambulatory Visit | Attending: Obstetrics and Gynecology | Admitting: Obstetrics and Gynecology

## 2011-07-05 ENCOUNTER — Other Ambulatory Visit (HOSPITAL_COMMUNITY): Payer: Self-pay | Admitting: Family Medicine

## 2011-07-05 DIAGNOSIS — R928 Other abnormal and inconclusive findings on diagnostic imaging of breast: Secondary | ICD-10-CM

## 2011-07-05 DIAGNOSIS — R911 Solitary pulmonary nodule: Secondary | ICD-10-CM

## 2011-07-07 ENCOUNTER — Encounter (HOSPITAL_COMMUNITY): Payer: Self-pay

## 2011-07-07 ENCOUNTER — Encounter (HOSPITAL_COMMUNITY)
Admission: RE | Admit: 2011-07-07 | Discharge: 2011-07-07 | Disposition: A | Discharge status: Home / Self Care | Payer: Medicare Other | Source: Ambulatory Visit | Attending: Family Medicine | Admitting: Family Medicine

## 2011-07-07 DIAGNOSIS — E041 Nontoxic single thyroid nodule: Secondary | ICD-10-CM | POA: Insufficient documentation

## 2011-07-07 DIAGNOSIS — M899 Disorder of bone, unspecified: Secondary | ICD-10-CM | POA: Insufficient documentation

## 2011-07-07 DIAGNOSIS — M949 Disorder of cartilage, unspecified: Secondary | ICD-10-CM | POA: Insufficient documentation

## 2011-07-07 DIAGNOSIS — S2239XA Fracture of one rib, unspecified side, initial encounter for closed fracture: Secondary | ICD-10-CM | POA: Insufficient documentation

## 2011-07-07 DIAGNOSIS — N289 Disorder of kidney and ureter, unspecified: Secondary | ICD-10-CM | POA: Insufficient documentation

## 2011-07-07 DIAGNOSIS — R599 Enlarged lymph nodes, unspecified: Secondary | ICD-10-CM | POA: Insufficient documentation

## 2011-07-07 DIAGNOSIS — I7090 Unspecified atherosclerosis: Secondary | ICD-10-CM | POA: Insufficient documentation

## 2011-07-07 DIAGNOSIS — R918 Other nonspecific abnormal finding of lung field: Secondary | ICD-10-CM | POA: Insufficient documentation

## 2011-07-07 DIAGNOSIS — R911 Solitary pulmonary nodule: Secondary | ICD-10-CM

## 2011-07-07 DIAGNOSIS — X58XXXA Exposure to other specified factors, initial encounter: Secondary | ICD-10-CM | POA: Insufficient documentation

## 2011-07-07 MED ORDER — FLUDEOXYGLUCOSE F - 18 (FDG) INJECTION
18.6000 | Freq: Once | INTRAVENOUS | Status: AC | PRN
  Administered 2011-07-07: 18.6 via INTRAVENOUS

## 2011-07-13 ENCOUNTER — Other Ambulatory Visit: Payer: Self-pay | Admitting: Cardiothoracic Surgery

## 2011-07-13 ENCOUNTER — Institutional Professional Consult (permissible substitution) (INDEPENDENT_AMBULATORY_CARE_PROVIDER_SITE_OTHER): Payer: Medicare Other | Admitting: Cardiothoracic Surgery

## 2011-07-13 VITALS — BP 146/78 | HR 64 | Resp 16 | Ht 64.0 in | Wt 158.0 lb

## 2011-07-13 DIAGNOSIS — S2232XA Fracture of one rib, left side, initial encounter for closed fracture: Secondary | ICD-10-CM

## 2011-07-13 DIAGNOSIS — M519 Unspecified thoracic, thoracolumbar and lumbosacral intervertebral disc disorder: Secondary | ICD-10-CM | POA: Insufficient documentation

## 2011-07-13 DIAGNOSIS — R222 Localized swelling, mass and lump, trunk: Secondary | ICD-10-CM

## 2011-07-13 DIAGNOSIS — Z9861 Coronary angioplasty status: Secondary | ICD-10-CM

## 2011-07-13 DIAGNOSIS — I2510 Atherosclerotic heart disease of native coronary artery without angina pectoris: Secondary | ICD-10-CM

## 2011-07-13 DIAGNOSIS — M7120 Synovial cyst of popliteal space [Baker], unspecified knee: Secondary | ICD-10-CM | POA: Insufficient documentation

## 2011-07-13 DIAGNOSIS — D381 Neoplasm of uncertain behavior of trachea, bronchus and lung: Secondary | ICD-10-CM

## 2011-07-13 DIAGNOSIS — S2239XA Fracture of one rib, unspecified side, initial encounter for closed fracture: Secondary | ICD-10-CM

## 2011-07-13 DIAGNOSIS — R42 Dizziness and giddiness: Secondary | ICD-10-CM

## 2011-07-13 DIAGNOSIS — R918 Other nonspecific abnormal finding of lung field: Secondary | ICD-10-CM

## 2011-07-13 DIAGNOSIS — E559 Vitamin D deficiency, unspecified: Secondary | ICD-10-CM | POA: Insufficient documentation

## 2011-07-13 DIAGNOSIS — E079 Disorder of thyroid, unspecified: Secondary | ICD-10-CM | POA: Insufficient documentation

## 2011-07-13 LAB — CREATININE, SERUM: Creat: 0.66 mg/dL (ref 0.50–1.10)

## 2011-07-13 NOTE — Progress Notes (Signed)
PCP is No primary provider on file. Referring Provider is White, Cynthia S, MD                     [address]            [place] [address]          [phone]       Chief Complaint  Patient presents with  . Lung Mass    eval and treat...CT,PET    HPI: I was asked to evaluate this 77-year-old Caucasian female ex-smoker for evaluation and treatment of a recently diagnosed left upper lobe mass. The patient developed left chest pain this past fall while doing yard work. He did not get better and she had a chest x-ray performed. The chest x-ray showed a density in the left upper lung field. A CT scan was then performed which demonstrated a 1.8 cm nodular density in the left upper lobe, a fracture of the left sixth rib and a 1 cm mass in the right middle lobe. There was AP window adenopathy as well some peritracheal adenopathy. The PET scan was then performed. This demonstrates the left upper lobe nodule a 2 cm with an SUV 7.6. The right middle lobe nodule measures 1 cm and has SUV of 5.2 SUV. Right hilar lymph node has hypermetabolic activity of 11.5 SUV. The AP window node has SUV of 9.6 and a left hilar node has a SUV of 7.7. There is no abnormal hypermetabolic activity in the abdomen. The left rib fracture had a hypermetabolic activity of 6.0 SUV. This is probably a pathologic fracture. The patient has some thyroid nodules which were not hypermetabolic. The findings on CT-PET were consistent with probable primary bronchogenic carcinoma with metastatic involvement of the mediastinum and probable contralateral of right lung.  The patient denies any primary symptoms of shortness of breath hemoptysis. She has had night sweats but relates this to a long history of perimenopausal temperature intolerance. She denies weight loss. She has had 75 some dizziness but mainly related to orthostatic positional change. The left rib pain is persistent but not requiring narcotics.  The patient has  significant degenerative arthritis of the lower spine with sciatica-type pain and has received recent steroid injection into the epidural space. There is no abnormal activity on PET scan in this area. The patient has had a recent abnormal mammogram which was further evaluated with a right breast ultrasound which apparently showed no suspicious lesion.  Past Medical History  Diagnosis Date  . Hypertension   . Hyperlipidemia   . CAD (coronary artery disease) 2007    PCI of RCA, bare metal stent.....Dr. Turner  . Thyroid disease     nodules,stable on u/s 2/12  . Baker's cyst     L KNEE  . Vitamin d deficiency   . Disc disorder of lumbar region     2 BULDGING DISCS DX 1990 BY DR. DALLDORF    Past Surgical History  Procedure Date  . Eye surgery 01/07/09    LEFT CATARACT  . Eye surgery 01/21/09    RIGHT CATARACT  . Carpal tunnel release 06/26/08        04/20/09    RIGHT HAND          LEFT HAND  . Finger contracture release 8/12    Dr. Gramig    right hand trigger finger release    No family history on file.  Social History the patient lives by herself. She   has a son in the area and takes care of the 6-year-old grandchild. She is retired from working in merchandise in., [place]. History  Substance Use Topics  . Smoking status: Former Smoker    Quit date: 07/12/2005  . Smokeless tobacco: Not on file  . Alcohol Use: No    Current Outpatient Prescriptions  Medication Sig Dispense Refill  . amLODipine (NORVASC) 10 MG tablet Take 10 mg by mouth daily.        . aspirin 81 MG tablet Take 81 mg by mouth daily.        . cholecalciferol (VITAMIN D) 1000 UNITS tablet Take 1,000 Units by mouth daily.      . losartan-hydrochlorothiazide (HYZAAR) 100-12.5 MG per tablet Take 1 tablet by mouth daily.        . venlafaxine (EFFEXOR-XR) 37.5 MG 24 hr capsule Take 37.5 mg by mouth daily.      . cloNIDine (CATAPRES) 0.1 MG tablet Take 0.1 mg by mouth 2 (two) times daily.        . fish oil-omega-3  fatty acids 1000 MG capsule Take 1 g by mouth 3 (three) times daily.        Allergies  Allergen Reactions  . Lipitor (Atorvastatin Calcium) Other (See Comments)    MYALGIAS  . Protonix Rash    Review of Systems Constitutional review negative for fever weight loss positive for lung cancer night sweats no recent history of pneumonia Cardiac positive for stent in 2007 no subsequent angina GI negative for hepatitis jaundice blood per Endocrine negative for diabetes Hematologic negative for bleeding disorder or blood transfusion Vascular negative for DVT claudication TIA Neurologic negative for stroke or seizure  BP 146/78  Pulse 64  Resp 16  Ht 5' 4" (1.626 m)  Wt 158 lb (71.668 kg)  BMI 27.12 kg/m2  SpO2 97% Physical Exam General appearance very strong looking 77-year-old female in no distress HEENT pupils equal dentition good Neck without JVD mass adenopathy or carotid bruit Thorax without deformity tenderness over the left sixth rib laterally breath sounds clear Cardiac regular rhythm without murmur or gallop Abdomen soft nontender without pulsatile mass Extremities no clubbing cyanosis tenderness or edema Vascular positive pulses in all extremities Neurologic no focal motor deficit normal gait   Diagnostic Tests:I reviewed the CT scan and PET scan with the patient showing a probable primary left upper lobe hypermetabolic mass with AP window bilateral hilar and mediastinal adenopathy and a probable contralateral metastatic nodule in the right middle lobe   Impression: Clinical stage IV lung cancer. Tissue diagnosis needs to be obtained. I've reviewed the options of bronchoscopy and endobronchial ultrasonic transbronchial biopsy under general anesthesia, media stinoscopy  if needed, or transthoracic needle biopsy of the primary tumor. We discussed the potential risks of these procedures to include bleeding from EB US or mediastinoscopy and risk of pneumothorax from  transthoracic needle biopsy. The patient would prefer to proceed with transbronchial biopsy under general anesthesia and if that is not diagnostic then a mediastinoscopy at the same operation. This will be scheduled for Thursday, February 21 at [facility].  Plan:  

## 2011-07-13 NOTE — Patient Instructions (Signed)
Stop fish oil

## 2011-07-14 ENCOUNTER — Other Ambulatory Visit: Payer: Self-pay

## 2011-07-14 ENCOUNTER — Encounter (HOSPITAL_COMMUNITY): Payer: Self-pay | Admitting: Pharmacy Technician

## 2011-07-14 DIAGNOSIS — D381 Neoplasm of uncertain behavior of trachea, bronchus and lung: Secondary | ICD-10-CM

## 2011-07-17 ENCOUNTER — Other Ambulatory Visit (HOSPITAL_COMMUNITY): Payer: Medicare Other

## 2011-07-18 ENCOUNTER — Other Ambulatory Visit: Payer: Self-pay

## 2011-07-18 ENCOUNTER — Encounter (HOSPITAL_COMMUNITY)
Admission: RE | Admit: 2011-07-18 | Discharge: 2011-07-18 | Disposition: A | Discharge status: Home / Self Care | Payer: Medicare Other | Source: Ambulatory Visit | Attending: Cardiothoracic Surgery | Admitting: Cardiothoracic Surgery

## 2011-07-18 ENCOUNTER — Encounter (HOSPITAL_COMMUNITY): Payer: Self-pay

## 2011-07-18 ENCOUNTER — Ambulatory Visit (HOSPITAL_COMMUNITY): Admission: RE | Admit: 2011-07-18 | Payer: Medicare Other | Source: Ambulatory Visit

## 2011-07-18 DIAGNOSIS — D381 Neoplasm of uncertain behavior of trachea, bronchus and lung: Secondary | ICD-10-CM

## 2011-07-18 HISTORY — DX: Dizziness and giddiness: R42

## 2011-07-18 HISTORY — DX: Other specified health status: Z78.9

## 2011-07-18 HISTORY — DX: Malignant (primary) neoplasm, unspecified: C80.1

## 2011-07-18 LAB — COMPREHENSIVE METABOLIC PANEL
ALT: 14 U/L (ref 0–35)
AST: 15 U/L (ref 0–37)
Albumin: 3.4 g/dL — ABNORMAL LOW (ref 3.5–5.2)
Alkaline Phosphatase: 79 U/L (ref 39–117)
BUN: 20 mg/dL (ref 6–23)
CO2: 29 mEq/L (ref 19–32)
Calcium: 9.5 mg/dL (ref 8.4–10.5)
Chloride: 104 mEq/L (ref 96–112)
Creatinine, Ser: 0.72 mg/dL (ref 0.50–1.10)
GFR calc Af Amer: >90 mL/min (ref >90)
GFR calc non Af Amer: 83 mL/min — ABNORMAL LOW (ref >90)
Glucose, Bld: 93 mg/dL (ref 70–99)
Potassium: 3.5 mEq/L (ref 3.5–5.1)
Sodium: 140 mEq/L (ref 135–145)
Total Bilirubin: 0.4 mg/dL (ref 0.3–1.2)
Total Protein: 6.8 g/dL (ref 6.0–8.3)

## 2011-07-18 LAB — SURGICAL PCR SCREEN
MRSA, PCR: NEGATIVE
Staphylococcus aureus: NEGATIVE

## 2011-07-18 LAB — PROTIME-INR
INR: 0.97 (ref 0.00–1.49)
Prothrombin Time: 13.1 seconds (ref 11.6–15.2)

## 2011-07-18 LAB — TYPE AND SCREEN
ABO/RH(D): B POS
Antibody Screen: NEGATIVE

## 2011-07-18 LAB — CBC
HCT: 40.3 % (ref 36.0–46.0)
Hemoglobin: 13.7 g/dL (ref 12.0–15.0)
MCH: 30.2 pg (ref 26.0–34.0)
MCHC: 34 g/dL (ref 30.0–36.0)
MCV: 88.8 fL (ref 78.0–100.0)
Platelets: 177 10*3/uL (ref 150–400)
RBC: 4.54 MIL/uL (ref 3.87–5.11)
RDW: 13.9 % (ref 11.5–15.5)
WBC: 5.2 10*3/uL (ref 4.0–10.5)

## 2011-07-18 LAB — ABO/RH: ABO/RH(D): B POS

## 2011-07-18 LAB — APTT: aPTT: 27 seconds (ref 24–37)

## 2011-07-18 NOTE — Pre-Procedure Instructions (Signed)
[name]  07/18/2011   Your procedure is scheduled on:  Thursday 07/20/11    Report to [facility] at 530 AM.  Call this number if you have problems the morning of surgery: [phone]   Remember:   Do not eat food:After Midnight.  May have clear liquids: up to 4 Hours before arrival.  Clear liquids include soda, tea, black coffee, apple or grape juice, broth.  Take these medicines the morning of surgery with A SIP OF WATER: norvasc, clonidine(catapres) ,effexor   Do not wear jewelry, make-up or nail polish.  Do not wear lotions, powders, or perfumes. You may wear deodorant.  Do not shave 48 hours prior to surgery.  Do not bring valuables to the hospital.  Contacts, dentures or bridgework may not be worn into surgery.  Leave suitcase in the car. After surgery it may be brought to your room.  For patients admitted to the hospital, checkout time is 11:00 AM the day of discharge.   Patients discharged the day of surgery will not be allowed to drive home.  Name and phone number of your driver:   Special Instructions: CHG Shower Use Special Wash: 1/2 bottle night before surgery and 1/2 bottle morning of surgery.   Please read over the following fact sheets that you were given: Pain Booklet, MRSA Information and Surgical Site Infection Prevention

## 2011-07-18 NOTE — Progress Notes (Signed)
Requested last office visit, and stress test from  DR. TURNERS OFFICE.

## 2011-07-19 MED ORDER — CEFUROXIME SODIUM 1.5 G IJ SOLR
1.5000 g | INTRAMUSCULAR | Status: AC
  Administered 2011-07-20: 1.5 g via INTRAVENOUS
  Filled 2011-07-19 (×2): qty 1.5

## 2011-07-20 ENCOUNTER — Encounter (HOSPITAL_COMMUNITY): Payer: Self-pay | Admitting: *Deleted

## 2011-07-20 ENCOUNTER — Other Ambulatory Visit: Payer: Self-pay | Admitting: Thoracic Surgery

## 2011-07-20 ENCOUNTER — Ambulatory Visit (HOSPITAL_COMMUNITY): Payer: Medicare Other | Admitting: *Deleted

## 2011-07-20 ENCOUNTER — Ambulatory Visit (HOSPITAL_COMMUNITY)
Admission: RE | Admit: 2011-07-20 | Discharge: 2011-07-20 | Disposition: A | Discharge status: Home / Self Care | Payer: Medicare Other | Source: Ambulatory Visit | Attending: Thoracic Surgery | Admitting: Thoracic Surgery

## 2011-07-20 ENCOUNTER — Encounter (HOSPITAL_COMMUNITY): Payer: Self-pay

## 2011-07-20 ENCOUNTER — Encounter (HOSPITAL_COMMUNITY)
Admission: RE | Disposition: A | Discharge status: Home / Self Care | Payer: Self-pay | Source: Ambulatory Visit | Attending: Thoracic Surgery

## 2011-07-20 DIAGNOSIS — R918 Other nonspecific abnormal finding of lung field: Secondary | ICD-10-CM | POA: Insufficient documentation

## 2011-07-20 DIAGNOSIS — E785 Hyperlipidemia, unspecified: Secondary | ICD-10-CM | POA: Insufficient documentation

## 2011-07-20 DIAGNOSIS — Z01810 Encounter for preprocedural cardiovascular examination: Secondary | ICD-10-CM | POA: Insufficient documentation

## 2011-07-20 DIAGNOSIS — M47817 Spondylosis without myelopathy or radiculopathy, lumbosacral region: Secondary | ICD-10-CM | POA: Insufficient documentation

## 2011-07-20 DIAGNOSIS — Z9861 Coronary angioplasty status: Secondary | ICD-10-CM | POA: Insufficient documentation

## 2011-07-20 DIAGNOSIS — R599 Enlarged lymph nodes, unspecified: Secondary | ICD-10-CM | POA: Insufficient documentation

## 2011-07-20 DIAGNOSIS — Z87891 Personal history of nicotine dependence: Secondary | ICD-10-CM | POA: Insufficient documentation

## 2011-07-20 DIAGNOSIS — D381 Neoplasm of uncertain behavior of trachea, bronchus and lung: Secondary | ICD-10-CM

## 2011-07-20 DIAGNOSIS — I2510 Atherosclerotic heart disease of native coronary artery without angina pectoris: Secondary | ICD-10-CM | POA: Insufficient documentation

## 2011-07-20 DIAGNOSIS — I10 Essential (primary) hypertension: Secondary | ICD-10-CM | POA: Insufficient documentation

## 2011-07-20 DIAGNOSIS — E041 Nontoxic single thyroid nodule: Secondary | ICD-10-CM | POA: Insufficient documentation

## 2011-07-20 DIAGNOSIS — E559 Vitamin D deficiency, unspecified: Secondary | ICD-10-CM | POA: Insufficient documentation

## 2011-07-20 DIAGNOSIS — Z01812 Encounter for preprocedural laboratory examination: Secondary | ICD-10-CM | POA: Insufficient documentation

## 2011-07-20 DIAGNOSIS — Z01818 Encounter for other preprocedural examination: Secondary | ICD-10-CM | POA: Insufficient documentation

## 2011-07-20 SURGERY — BRONCHOSCOPY, WITH EBUS
Anesthesia: General | Site: Chest | Wound class: Clean Contaminated

## 2011-07-20 MED ORDER — ROCURONIUM BROMIDE 100 MG/10ML IV SOLN
INTRAVENOUS | Status: DC | PRN
  Administered 2011-07-20: 50 mg via INTRAVENOUS

## 2011-07-20 MED ORDER — ONDANSETRON HCL 4 MG/2ML IJ SOLN
INTRAMUSCULAR | Status: DC | PRN
  Administered 2011-07-20: 4 mg via INTRAVENOUS

## 2011-07-20 MED ORDER — MIDAZOLAM HCL 5 MG/5ML IJ SOLN
INTRAMUSCULAR | Status: DC | PRN
  Administered 2011-07-20 (×2): 1 mg via INTRAVENOUS

## 2011-07-20 MED ORDER — 0.9 % SODIUM CHLORIDE (POUR BTL) OPTIME
TOPICAL | Status: DC | PRN
  Administered 2011-07-20: 1000 mL

## 2011-07-20 MED ORDER — SODIUM CHLORIDE 0.9 % IJ SOLN: 3.0000 mL | Freq: Two times a day (BID) | INTRAMUSCULAR | Status: DC

## 2011-07-20 MED ORDER — FENTANYL CITRATE 0.05 MG/ML IJ SOLN
INTRAMUSCULAR | Status: DC | PRN
  Administered 2011-07-20: 100 ug via INTRAVENOUS
  Administered 2011-07-20: 50 ug via INTRAVENOUS

## 2011-07-20 MED ORDER — EPHEDRINE SULFATE 50 MG/ML IJ SOLN
INTRAMUSCULAR | Status: DC | PRN
  Administered 2011-07-20: 10 mg via INTRAVENOUS
  Administered 2011-07-20: 5 mg via INTRAVENOUS

## 2011-07-20 MED ORDER — ONDANSETRON HCL 4 MG/2ML IJ SOLN: 4.0000 mg | Freq: Once | INTRAMUSCULAR | Status: DC | PRN

## 2011-07-20 MED ORDER — SODIUM CHLORIDE 0.9 % IJ SOLN: 3.0000 mL | INTRAMUSCULAR | Status: DC | PRN

## 2011-07-20 MED ORDER — GLYCOPYRROLATE 0.2 MG/ML IJ SOLN
INTRAMUSCULAR | Status: DC | PRN
  Administered 2011-07-20: .8 mg via INTRAVENOUS

## 2011-07-20 MED ORDER — LACTATED RINGERS IV SOLN
INTRAVENOUS | Status: DC | PRN
  Administered 2011-07-20 (×2): via INTRAVENOUS

## 2011-07-20 MED ORDER — PROPOFOL 10 MG/ML IV EMUL
INTRAVENOUS | Status: DC | PRN
  Administered 2011-07-20: 150 mg via INTRAVENOUS
  Administered 2011-07-20: 50 mg via INTRAVENOUS

## 2011-07-20 MED ORDER — LIDOCAINE HCL (CARDIAC) 20 MG/ML IV SOLN
INTRAVENOUS | Status: DC | PRN
  Administered 2011-07-20: 100 mg via INTRAVENOUS

## 2011-07-20 MED ORDER — NEOSTIGMINE METHYLSULFATE 1 MG/ML IJ SOLN
INTRAMUSCULAR | Status: DC | PRN
  Administered 2011-07-20: 5 mg via INTRAVENOUS

## 2011-07-20 MED ORDER — OXYCODONE HCL 5 MG PO TABS: 5.0000 mg | ORAL_TABLET | ORAL | Status: DC | PRN

## 2011-07-20 MED ORDER — ACETAMINOPHEN 325 MG PO TABS: 650.0000 mg | ORAL_TABLET | ORAL | Status: DC | PRN

## 2011-07-20 MED ORDER — PROMETHAZINE HCL 25 MG/ML IJ SOLN: 12.5000 mg | Freq: Four times a day (QID) | INTRAMUSCULAR | Status: DC | PRN

## 2011-07-20 MED ORDER — MEPERIDINE HCL 25 MG/ML IJ SOLN: 6.2500 mg | INTRAMUSCULAR | Status: DC | PRN

## 2011-07-20 MED ORDER — MORPHINE SULFATE 2 MG/ML IJ SOLN: 0.0500 mg/kg | INTRAMUSCULAR | Status: DC | PRN

## 2011-07-20 MED ORDER — ACETAMINOPHEN 650 MG RE SUPP: 650.0000 mg | RECTAL | Status: DC | PRN

## 2011-07-20 MED ORDER — SODIUM CHLORIDE 0.9 % IV SOLN: 250.0000 mL | INTRAVENOUS | Status: DC | PRN

## 2011-07-20 MED ORDER — PHENYLEPHRINE HCL 10 MG/ML IJ SOLN
INTRAMUSCULAR | Status: DC | PRN
  Administered 2011-07-20: 80 ug via INTRAVENOUS

## 2011-07-20 MED ORDER — ONDANSETRON HCL 4 MG/2ML IJ SOLN: 4.0000 mg | Freq: Four times a day (QID) | INTRAMUSCULAR | Status: DC | PRN

## 2011-07-20 MED ORDER — HYDROMORPHONE HCL PF 1 MG/ML IJ SOLN: 0.2500 mg | INTRAMUSCULAR | Status: DC | PRN

## 2011-07-20 SURGICAL SUPPLY — 51 items
ADH SKN CLS APL DERMABOND .7 (GAUZE/BANDAGES/DRESSINGS)
BLADE SURG 10 STRL SS (BLADE) IMPLANT
BRUSH CYTOL CELLEBRITY 1.5X140 (MISCELLANEOUS) IMPLANT
CANISTER SUCTION 2500CC (MISCELLANEOUS) ×2 IMPLANT
CLIP TI MEDIUM 6 (CLIP) IMPLANT
CLOTH BEACON ORANGE TIMEOUT ST (SAFETY) ×2 IMPLANT
CONT SPEC 4OZ CLIKSEAL STRL BL (MISCELLANEOUS) ×2 IMPLANT
COVER SURGICAL LIGHT HANDLE (MISCELLANEOUS) IMPLANT
COVER TABLE BACK 60X90 (DRAPES) ×2 IMPLANT
DERMABOND ADVANCED (GAUZE/BANDAGES/DRESSINGS)
DERMABOND ADVANCED .7 DNX12 (GAUZE/BANDAGES/DRESSINGS) IMPLANT
DRAPE CAMERA VIDEO/LASER (DRAPES) IMPLANT
DRAPE CHEST BREAST 15X10 FENES (DRAPES) ×2 IMPLANT
ELECT CAUTERY BLADE 6.4 (BLADE) IMPLANT
ELECT REM PT RETURN 9FT ADLT (ELECTROSURGICAL)
ELECTRODE REM PT RTRN 9FT ADLT (ELECTROSURGICAL) IMPLANT
FORCEPS BIOP RJ4 1.8 (CUTTING FORCEPS) IMPLANT
GAUZE SPONGE 4X4 16PLY XRAY LF (GAUZE/BANDAGES/DRESSINGS) IMPLANT
GLOVE BIOGEL PI IND STRL 7.0 (GLOVE) ×1 IMPLANT
GLOVE BIOGEL PI INDICATOR 7.0 (GLOVE) ×1
GLOVE SURG SIGNA 7.5 PF LTX (GLOVE) ×2 IMPLANT
GOWN BRE IMP PREV XXLGXLNG (GOWN DISPOSABLE) IMPLANT
GOWN STRL NON-REIN LRG LVL3 (GOWN DISPOSABLE) ×2 IMPLANT
HEMOSTAT SURGICEL 2X14 (HEMOSTASIS) IMPLANT
KIT BASIN OR (CUSTOM PROCEDURE TRAY) IMPLANT
KIT ROOM TURNOVER OR (KITS) ×2 IMPLANT
MARKER SKIN DUAL TIP RULER LAB (MISCELLANEOUS) ×2 IMPLANT
NEEDLE BIOPSY TRANSBRONCH 21G (NEEDLE) IMPLANT
NEEDLE ECHOTIP HI DEF 22GA (NEEDLE) ×2 IMPLANT
NEEDLE SYS SONOTIP II EBUSTBNA (NEEDLE) ×4 IMPLANT
NS IRRIG 1000ML POUR BTL (IV SOLUTION) ×2 IMPLANT
OIL SILICONE PENTAX (PARTS (SERVICE/REPAIRS)) ×2 IMPLANT
PACK SURGICAL SETUP 50X90 (CUSTOM PROCEDURE TRAY) IMPLANT
PAD ARMBOARD 7.5X6 YLW CONV (MISCELLANEOUS) ×4 IMPLANT
PENCIL BUTTON HOLSTER BLD 10FT (ELECTRODE) IMPLANT
SPONGE GAUZE 4X4 12PLY (GAUZE/BANDAGES/DRESSINGS) ×2 IMPLANT
SPONGE INTESTINAL PEANUT (DISPOSABLE) IMPLANT
SUT SILK 2 0 TIES 10X30 (SUTURE) IMPLANT
SUT VIC AB 2-0 CT1 27 (SUTURE)
SUT VIC AB 2-0 CT1 TAPERPNT 27 (SUTURE) IMPLANT
SWAB COLLECTION DEVICE MRSA (MISCELLANEOUS) IMPLANT
SYR 20CC LL (SYRINGE) ×2 IMPLANT
SYR 20ML ECCENTRIC (SYRINGE) ×4 IMPLANT
SYR 5ML LUER SLIP (SYRINGE) ×2 IMPLANT
SYRINGE 10CC LL (SYRINGE) IMPLANT
TOWEL OR 17X24 6PK STRL BLUE (TOWEL DISPOSABLE) ×2 IMPLANT
TOWEL OR 17X26 10 PK STRL BLUE (TOWEL DISPOSABLE) IMPLANT
TRAP SPECIMEN MUCOUS 40CC (MISCELLANEOUS) ×2 IMPLANT
TUBE ANAEROBIC SPECIMEN COL (MISCELLANEOUS) IMPLANT
TUBE CONNECTING 12X1/4 (SUCTIONS) ×2 IMPLANT
WATER STERILE IRR 1000ML POUR (IV SOLUTION) IMPLANT

## 2011-07-20 NOTE — Op Note (Signed)
NAME:  Margaret Hale, Margaret Hale NO.:  [id number]  MEDICAL RECORD NO.:  [id number]  LOCATION:  [facility]                         FACILITY:  [facility]  PHYSICIAN:  Ines Bloomer, M.D. DATE OF BIRTH:  1934/11/15  DATE OF PROCEDURE:  07/20/2011 DATE OF DISCHARGE:                              OPERATIVE REPORT   PREOPERATIVE DIAGNOSES:  Left upper lobe nodule, right middle lobe nodule, mediastinal adenopathy.  POSTOPERATIVE DIAGNOSES:  Left upper lobe nodule, right middle lobe nodule, mediastinal adenopathy.  OPERATION PERFORMED:  Fiberoptic bronchoscopy with endobronchial ultrasound.  After general anesthesia, the video bronchoscope was passed down the trach.  The carina was in midline.  The right upper lobe, right middle lobe, right lower lobe orifices were normal.  Left mainstem, left upper lobe, left lower lobe orifices were normal.  Right mainstem orifice was normal.  The video bronchoscope was removed and the endobronchial ultrasound scope was inserted.  The patient had multiple moderately positive nodes on PET scan.  There was a 10R, 10L, 4L, 4R, and 7 nodes. We did 3 aspirations initially by passing the sheath out through the working channel and then under direct vision, putting the needle into the lymph node by an endobronchial ultrasound guidance.  We did a 7, 10R, and 10L node.  All of which were sent for rapid on-site cytology, which were nondiagnostic.  So, we continued to do another pass with two 4Ls passes, two 4R passes, two 10L passes, and another 10R and 7 pass. These were sent for permanent cytology.  Between each pass we would bring the needle back into the sheath and remove the sheath and then re- set it up __________ sheath.  After this had been done, we removed the endobronchial ultrasound scope, and the patient was awake and tolerated the procedure well.     Ines Bloomer, M.D.     DPB/MEDQ  D:  07/20/2011  T:  07/20/2011  Job:  [id number]

## 2011-07-20 NOTE — Preoperative (Signed)
Beta Blockers   Reason not to administer Beta Blockers:Not Applicable 

## 2011-07-20 NOTE — Interval H&P Note (Signed)
History and Physical Interval Note:  07/20/2011 6:56 AM  Margaret Hale  has presented today for surgery, with the diagnosis of lung mass  The various methods of treatment have been discussed with the patient and family. After consideration of risks, benefits and other options for treatment, the patient has consented to  Procedure(s) (LRB): VIDEO BRONCHOSCOPY WITH ENDOBRONCHIAL ULTRASOUND (N/A) MEDIASTINOSCOPY (N/A) as a surgical intervention .  The patients' history has been reviewed, patient examined, no change in status, stable for surgery.  I have reviewed the patients' chart and labs.  Questions were answered to the patient's satisfaction.     Cameron Proud

## 2011-07-20 NOTE — Anesthesia Preprocedure Evaluation (Addendum)
Anesthesia Evaluation  Patient identified by MRN, date of birth, ID band Patient awake    Reviewed: Allergy & Precautions, H&P , NPO status , Patient's Chart, lab work & pertinent test results  Airway Mallampati: II TM Distance: >3 FB Neck ROM: Full    Dental  (+) Edentulous Upper and Dental Advisory Given   Pulmonary          Cardiovascular hypertension, + CAD     Neuro/Psych    GI/Hepatic   Endo/Other    Renal/GU      Musculoskeletal   Abdominal   Peds  Hematology   Anesthesia Other Findings   Reproductive/Obstetrics                          Anesthesia Physical Anesthesia Plan  ASA: III  Anesthesia Plan: General ETT   Post-op Pain Management:    Induction:   Airway Management Planned:   Additional Equipment:   Intra-op Plan:   Post-operative Plan:   Informed Consent: I have reviewed the patients History and Physical, chart, labs and discussed the procedure including the risks, benefits and alternatives for the proposed anesthesia with the patient or authorized representative who has indicated his/her understanding and acceptance.     Plan Discussed with: CRNA and Surgeon  Anesthesia Plan Comments:         Anesthesia Quick Evaluation

## 2011-07-20 NOTE — Progress Notes (Signed)
Dr Edwyna Shell in to see [t

## 2011-07-20 NOTE — Anesthesia Postprocedure Evaluation (Signed)
Anesthesia Post Note  Patient: Margaret Hale  Procedure(s) Performed: Procedure(s) (LRB): VIDEO BRONCHOSCOPY WITH ENDOBRONCHIAL ULTRASOUND (N/A)  Anesthesia type: general  Patient location: PACU  Post pain: Pain level controlled  Post assessment: Patient's Cardiovascular Status Stable  Last Vitals:  Filed Vitals:   07/20/11 0950  BP:   Pulse: 63  Temp: 36.4 C  Resp: 18    Post vital signs: Reviewed and stable  Level of consciousness: sedated  Complications: No apparent anesthesia complications

## 2011-07-20 NOTE — H&P (View-Only) (Signed)
PCP is No primary provider on file. Referring Provider is Cala Bradford, MD                     [address] [place].[address]            [place] [address]          [phone]       Chief Complaint  Patient presents with  . Lung Mass    eval and treat...CT,PET    HPI: I was asked to evaluate this 77 year old Caucasian female ex-smoker for evaluation and treatment of a recently diagnosed left upper lobe mass. The patient developed left chest pain this past fall while doing yard work. He did not get better and she had a chest x-ray performed. The chest x-ray showed a density in the left upper lung field. A CT scan was then performed which demonstrated a 1.8 cm nodular density in the left upper lobe, a fracture of the left sixth rib and a 1 cm mass in the right middle lobe. There was AP window adenopathy as well some peritracheal adenopathy. The PET scan was then performed. This demonstrates the left upper lobe nodule a 2 cm with an SUV 7.6. The right middle lobe nodule measures 1 cm and has SUV of 5.2 SUV. Right hilar lymph node has hypermetabolic activity of 11.5 SUV. The AP window node has SUV of 9.6 and a left hilar node has a SUV of 7.7. There is no abnormal hypermetabolic activity in the abdomen. The left rib fracture had a hypermetabolic activity of 6.0 SUV. This is probably a pathologic fracture. The patient has some thyroid nodules which were not hypermetabolic. The findings on CT-PET were consistent with probable primary bronchogenic carcinoma with metastatic involvement of the mediastinum and probable contralateral of right lung.  The patient denies any primary symptoms of shortness of breath hemoptysis. She has had night sweats but relates this to a long history of perimenopausal temperature intolerance. She denies weight loss. She has had some dizziness but mainly related to orthostatic positional change. The left rib pain is persistent but not requiring narcotics.  The patient has  significant degenerative arthritis of the lower spine with sciatica-type pain and has received recent steroid injection into the epidural space. There is no abnormal activity on PET scan in this area. The patient has had a recent abnormal mammogram which was further evaluated with a right breast ultrasound which apparently showed no suspicious lesion.  Past Medical History  Diagnosis Date  . Hypertension   . Hyperlipidemia   . CAD (coronary artery disease) 2007    PCI of RCA, bare metal stent.....Dr. Mayford Knife  . Thyroid disease     nodules,stable on u/s 2/12  . Baker's cyst     L KNEE  . Vitamin d deficiency   . Disc disorder of lumbar region     2 BULDGING DISCS DX 1990 BY DR. DALLDORF    Past Surgical History  Procedure Date  . Eye surgery 01/07/09    LEFT CATARACT  . Eye surgery 01/21/09    RIGHT CATARACT  . Carpal tunnel release 06/26/08        04/20/09    RIGHT HAND          LEFT HAND  . Finger contracture release 8/12    Dr. Amanda Pea    right hand trigger finger release    No family history on file.  Social History the patient lives by herself. She  has a son in the area and takes care of the 22-year-old grandchild. She is retired from working in Education officer, community in., [place]. History  Substance Use Topics  . Smoking status: Former Smoker    Quit date: 07/12/2005  . Smokeless tobacco: Not on file  . Alcohol Use: No    Current Outpatient Prescriptions  Medication Sig Dispense Refill  . amLODipine (NORVASC) 10 MG tablet Take 10 mg by mouth daily.        Marland Kitchen aspirin 81 MG tablet Take 81 mg by mouth daily.        . cholecalciferol (VITAMIN D) 1000 UNITS tablet Take 1,000 Units by mouth daily.      Marland Kitchen losartan-hydrochlorothiazide (HYZAAR) 100-12.5 MG per tablet Take 1 tablet by mouth daily.        Marland Kitchen venlafaxine (EFFEXOR-XR) 37.5 MG 24 hr capsule Take 37.5 mg by mouth daily.      . cloNIDine (CATAPRES) 0.1 MG tablet Take 0.1 mg by mouth 2 (two) times daily.        . fish oil-omega-3  fatty acids 1000 MG capsule Take 1 g by mouth 3 (three) times daily.        Allergies  Allergen Reactions  . Lipitor (Atorvastatin Calcium) Other (See Comments)    MYALGIAS  . Protonix Rash    Review of Systems Constitutional review negative for fever weight loss positive for lung cancer night sweats no recent history of pneumonia Cardiac positive for stent in 2007 no subsequent angina GI negative for hepatitis jaundice blood per Endocrine negative for diabetes Hematologic negative for bleeding disorder or blood transfusion Vascular negative for DVT claudication TIA Neurologic negative for stroke or seizure  BP 146/78  Pulse 64  Resp 16  Ht 5\' 4"  (1.626 m)  Wt 158 lb (71.668 kg)  BMI 27.12 kg/m2  SpO2 97% Physical Exam General appearance very strong looking 77 year old female in no distress HEENT pupils equal dentition good Neck without JVD mass adenopathy or carotid bruit Thorax without deformity tenderness over the left sixth rib laterally breath sounds clear Cardiac regular rhythm without murmur or gallop Abdomen soft nontender without pulsatile mass Extremities no clubbing cyanosis tenderness or edema Vascular positive pulses in all extremities Neurologic no focal motor deficit normal gait   Diagnostic Tests:I reviewed the CT scan and PET scan with the patient showing a probable primary left upper lobe hypermetabolic mass with AP window bilateral hilar and mediastinal adenopathy and a probable contralateral metastatic nodule in the right middle lobe   Impression: Clinical stage IV lung cancer. Tissue diagnosis needs to be obtained. I've reviewed the options of bronchoscopy and endobronchial ultrasonic transbronchial biopsy under general anesthesia, media stinoscopy  if needed, or transthoracic needle biopsy of the primary tumor. We discussed the potential risks of these procedures to include bleeding from EB [place] or mediastinoscopy and risk of pneumothorax from  transthoracic needle biopsy. The patient would prefer to proceed with transbronchial biopsy under general anesthesia and if that is not diagnostic then a mediastinoscopy at the same operation. This will be scheduled for Thursday, February 21 at [facility].  Plan:

## 2011-07-20 NOTE — Brief Op Note (Signed)
07/20/2011  9:19 AM  PATIENT:  Margaret Hale  77 y.o. female  PRE-OPERATIVE DIAGNOSIS:  lung mass  POST-OPERATIVE DIAGNOSIS:  Right middle and right lingular nodules medistinal adenopathy  PROCEDURE:  Procedure(s) (LRB): VIDEO BRONCHOSCOPY WITH ENDOBRONCHIAL ULTRASOUND (N/A)  SURGEON:  Surgeon(s) and Role:    * D Karle Plumber, MD - Primary  PHYSICIAN ASSISTANT:   ASSISTANTS: none   ANESTHESIA:   general  EBL:  Total I/O In: 1000 [I.V.:1000] Out: -   BLOOD ADMINISTERED:none  DRAINS: none   LOCAL MEDICATIONS USED:  NONE  SPECIMEN:  Aspirate  DISPOSITION OF SPECIMEN:  PATHOLOGY  COUNTS:  YES  TOURNIQUET:  * No tourniquets in log *  DICTATION: .Other Dictation: Dictation Number [id number]  PLAN OF CARE: Discharge to home after PACU  PATIENT DISPOSITION:  PACU - hemodynamically stable.   Delay start of Pharmacological VTE agent (>24hrs) due to surgical blood loss or risk of bleeding: yes

## 2011-07-20 NOTE — Anesthesia Procedure Notes (Signed)
Procedure Name: Intubation Date/Time: 07/20/2011 8:12 AM Performed by: Malachi Pro Pre-anesthesia Checklist: Patient identified, Emergency Drugs available, Suction available, Patient being monitored and Timeout performed Oxygen Delivery Method: Circle system utilized Preoxygenation: Pre-oxygenation with 100% oxygen Intubation Type: IV induction Ventilation: Mask ventilation without difficulty and Oral airway inserted - appropriate to patient size Laryngoscope Size: Mac and 3 Grade View: Grade II Tube type: Oral Tube size: 8.5 mm Number of attempts: 1 Airway Equipment and Method: Stylet Placement Confirmation: ETT inserted through vocal cords under direct vision,  positive ETCO2 and breath sounds checked- equal and bilateral Secured at: 21 cm Tube secured with: Tape Dental Injury: Teeth and Oropharynx as per pre-operative assessment

## 2011-07-20 NOTE — Transfer of Care (Signed)
Immediate Anesthesia Transfer of Care Note  Patient: Margaret Hale  Procedure(s) Performed: Procedure(s) (LRB): VIDEO BRONCHOSCOPY WITH ENDOBRONCHIAL ULTRASOUND (N/A)  Patient Location: PACU  Anesthesia Type: General  Level of Consciousness: awake and patient cooperative  Airway & Oxygen Therapy: Patient Spontanous Breathing and Patient connected to nasal cannula oxygen  Post-op Assessment: Report given to PACU RN, Post -op Vital signs reviewed and stable and Patient moving all extremities  Post vital signs: Reviewed and stable  Complications: No apparent anesthesia complications

## 2011-07-21 ENCOUNTER — Ambulatory Visit (HOSPITAL_COMMUNITY)
Admission: RE | Admit: 2011-07-21 | Discharge: 2011-07-21 | Disposition: A | Discharge status: Home / Self Care | Payer: Medicare Other | Source: Ambulatory Visit | Attending: Cardiothoracic Surgery | Admitting: Cardiothoracic Surgery

## 2011-07-21 DIAGNOSIS — D381 Neoplasm of uncertain behavior of trachea, bronchus and lung: Secondary | ICD-10-CM

## 2011-07-21 DIAGNOSIS — I10 Essential (primary) hypertension: Secondary | ICD-10-CM | POA: Insufficient documentation

## 2011-07-21 DIAGNOSIS — C3490 Malignant neoplasm of unspecified part of unspecified bronchus or lung: Secondary | ICD-10-CM | POA: Insufficient documentation

## 2011-07-21 DIAGNOSIS — R51 Headache: Secondary | ICD-10-CM | POA: Insufficient documentation

## 2011-07-21 DIAGNOSIS — R42 Dizziness and giddiness: Secondary | ICD-10-CM | POA: Insufficient documentation

## 2011-07-21 MED ORDER — GADOBENATE DIMEGLUMINE 529 MG/ML IV SOLN
15.0000 mL | Freq: Once | INTRAVENOUS | Status: AC | PRN
  Administered 2011-07-21: 14 mL via INTRAVENOUS

## 2011-07-22 LAB — CULTURE, RESPIRATORY W GRAM STAIN

## 2011-07-26 ENCOUNTER — Encounter (HOSPITAL_COMMUNITY): Payer: Self-pay | Admitting: Pharmacy Technician

## 2011-07-26 ENCOUNTER — Ambulatory Visit (INDEPENDENT_AMBULATORY_CARE_PROVIDER_SITE_OTHER): Payer: Medicare Other | Admitting: Thoracic Surgery

## 2011-07-26 ENCOUNTER — Encounter: Payer: Self-pay | Admitting: Thoracic Surgery

## 2011-07-26 ENCOUNTER — Other Ambulatory Visit: Payer: Self-pay

## 2011-07-26 VITALS — BP 145/80 | HR 69 | Resp 20 | Ht 62.0 in | Wt 162.0 lb

## 2011-07-26 DIAGNOSIS — Z9889 Other specified postprocedural states: Secondary | ICD-10-CM

## 2011-07-26 DIAGNOSIS — D381 Neoplasm of uncertain behavior of trachea, bronchus and lung: Secondary | ICD-10-CM

## 2011-07-26 NOTE — Progress Notes (Signed)
HPI the patient returns today after having endobronchial ultrasound. Of these 7 aspirations all were nondiagnostic. I explained to her that I still were that she had cancer. I recommended she had mediastinoscopy. At the same time we will do electromagnetic navigational bronchoscopy. I she understands the situation after a long discussion. I will see her again to do her surgery on March  sixth [facility].  Current Outpatient Prescriptions  Medication Sig Dispense Refill  . amLODipine (NORVASC) 10 MG tablet Take 10 mg by mouth every evening.       Marland Kitchen aspirin 81 MG tablet Take 81-162 mg by mouth daily. Patient normally takes one tablet daily. If feeling worse she takes 2 tablets.      . Cholecalciferol 2000 UNITS CAPS Take 2,000 Units by mouth daily.      . cloNIDine (CATAPRES) 0.1 MG tablet Take 0.1 mg by mouth 2 (two) times daily.        . fish oil-omega-3 fatty acids 1000 MG capsule Take 1 g by mouth 2 (two) times daily.       Marland Kitchen losartan-hydrochlorothiazide (HYZAAR) 100-12.5 MG per tablet Take 1 tablet by mouth daily.        Marland Kitchen venlafaxine (EFFEXOR-XR) 37.5 MG 24 hr capsule Take 37.5 mg by mouth daily.      Marland Kitchen alendronate (FOSAMAX) 70 MG tablet Take 70 mg by mouth every 7 (seven) days.          Review of Systems: Unchanged   Physical Exam clear to auscultation percussion   Diagnostic Tests: Endobronchial ultrasound aspirations were nondiagnostic   Impression: Right middle lobe and left upper lobe nodule positive PET scan   Plan: Mediastinoscopy with electromagnetic navigation bronchoscopy

## 2011-07-31 NOTE — Pre-Procedure Instructions (Signed)
[name]  07/31/2011   Your procedure is scheduled on:  Wednesday March 6,2013 at 0830 am  Report to [facility] at 0630 AM.  Call this number if you have problems the morning of surgery: [phone]   Remember:   Do not eat food:After Midnight.  May have clear liquids: up to 4 Hours before arrival until 0230 am.  Clear liquids include soda, tea, black coffee, apple or grape juice, broth.  Take these medicines the morning of surgery with A SIP OF WATER: Amlodipine, Clonidine, and Effexor XR.   Do not wear jewelry, make-up or nail polish.  Do not wear lotions, powders, or perfumes. You may wear deodorant.  Do not shave 48 hours prior to surgery.  Do not bring valuables to the hospital.  Contacts, dentures or bridgework may not be worn into surgery.  Leave suitcase in the car. After surgery it may be brought to your room.  For patients admitted to the hospital, checkout time is 11:00 AM the day of discharge.   Patients discharged the day of surgery will not be allowed to drive home.  Name and phone number of your driver:   Special Instructions: CHG Shower Use Special Wash: 1/2 bottle night before surgery and 1/2 bottle morning of surgery.   Please read over the following fact sheets that you were given: Pain Booklet, Coughing and Deep Breathing, Blood Transfusion Information and Surgical Site Infection Prevention

## 2011-08-01 ENCOUNTER — Encounter (HOSPITAL_COMMUNITY)
Admission: RE | Admit: 2011-08-01 | Discharge: 2011-08-01 | Disposition: A | Discharge status: Home / Self Care | Payer: Medicare Other | Source: Ambulatory Visit | Attending: Thoracic Surgery | Admitting: Thoracic Surgery

## 2011-08-01 ENCOUNTER — Encounter (HOSPITAL_COMMUNITY): Payer: Self-pay

## 2011-08-01 DIAGNOSIS — D381 Neoplasm of uncertain behavior of trachea, bronchus and lung: Secondary | ICD-10-CM

## 2011-08-01 LAB — CBC
HCT: 39.4 % (ref 36.0–46.0)
MCHC: 34.8 g/dL (ref 30.0–36.0)
MCV: 87.2 fL (ref 78.0–100.0)
RDW: 13.6 % (ref 11.5–15.5)

## 2011-08-01 LAB — COMPREHENSIVE METABOLIC PANEL
Albumin: 3.8 g/dL (ref 3.5–5.2)
BUN: 13 mg/dL (ref 6–23)
Creatinine, Ser: 0.6 mg/dL (ref 0.50–1.10)
GFR calc Af Amer: >90 mL/min (ref >90)
Total Bilirubin: 0.5 mg/dL (ref 0.3–1.2)
Total Protein: 7.4 g/dL (ref 6.0–8.3)

## 2011-08-01 LAB — PROTIME-INR
INR: 0.98 (ref 0.00–1.49)
Prothrombin Time: 13.2 seconds (ref 11.6–15.2)

## 2011-08-01 LAB — TYPE AND SCREEN: Antibody Screen: NEGATIVE

## 2011-08-01 MED ORDER — DEXTROSE 5 % IV SOLN
1.5000 g | INTRAVENOUS | Status: AC
  Administered 2011-08-02: 1.5 g via INTRAVENOUS
  Filled 2011-08-01: qty 1.5

## 2011-08-01 NOTE — Progress Notes (Signed)
Pt informed Nurse of having cardiac cath with Dr. Mendel Ryder [phone] and PCP is Dr. Carolanne Grumbling. Records requested. EKG was performed on 07/18/11 results in EPIC and placed on chart.

## 2011-08-02 ENCOUNTER — Ambulatory Visit (HOSPITAL_COMMUNITY)
Admission: RE | Admit: 2011-08-02 | Discharge: 2011-08-02 | Disposition: A | Discharge status: Home / Self Care | Payer: Medicare Other | Source: Ambulatory Visit | Attending: Thoracic Surgery | Admitting: Thoracic Surgery

## 2011-08-02 ENCOUNTER — Ambulatory Visit (HOSPITAL_COMMUNITY): Payer: Medicare Other

## 2011-08-02 ENCOUNTER — Ambulatory Visit (HOSPITAL_COMMUNITY): Payer: Medicare Other | Admitting: Anesthesiology

## 2011-08-02 ENCOUNTER — Encounter (HOSPITAL_COMMUNITY): Payer: Self-pay | Admitting: Anesthesiology

## 2011-08-02 ENCOUNTER — Encounter (HOSPITAL_COMMUNITY)
Admission: RE | Disposition: A | Discharge status: Home / Self Care | Payer: Self-pay | Source: Ambulatory Visit | Attending: Thoracic Surgery

## 2011-08-02 DIAGNOSIS — Z01812 Encounter for preprocedural laboratory examination: Secondary | ICD-10-CM | POA: Insufficient documentation

## 2011-08-02 DIAGNOSIS — Z9861 Coronary angioplasty status: Secondary | ICD-10-CM | POA: Insufficient documentation

## 2011-08-02 DIAGNOSIS — E785 Hyperlipidemia, unspecified: Secondary | ICD-10-CM | POA: Insufficient documentation

## 2011-08-02 DIAGNOSIS — D381 Neoplasm of uncertain behavior of trachea, bronchus and lung: Secondary | ICD-10-CM

## 2011-08-02 DIAGNOSIS — R599 Enlarged lymph nodes, unspecified: Secondary | ICD-10-CM | POA: Insufficient documentation

## 2011-08-02 DIAGNOSIS — Z01818 Encounter for other preprocedural examination: Secondary | ICD-10-CM | POA: Insufficient documentation

## 2011-08-02 DIAGNOSIS — I2510 Atherosclerotic heart disease of native coronary artery without angina pectoris: Secondary | ICD-10-CM | POA: Insufficient documentation

## 2011-08-02 DIAGNOSIS — R222 Localized swelling, mass and lump, trunk: Secondary | ICD-10-CM | POA: Insufficient documentation

## 2011-08-02 DIAGNOSIS — I10 Essential (primary) hypertension: Secondary | ICD-10-CM | POA: Insufficient documentation

## 2011-08-02 HISTORY — PX: MEDIASTINOSCOPY: SHX5086

## 2011-08-02 SURGERY — VIDEO BRONCHOSCOPY WITH ENDOBRONCHIAL NAVIGATION
Anesthesia: General | Site: Chest | Wound class: Clean Contaminated

## 2011-08-02 MED ORDER — LIDOCAINE HCL (CARDIAC) 20 MG/ML IV SOLN
INTRAVENOUS | Status: DC | PRN
  Administered 2011-08-02: 50 mg via INTRAVENOUS

## 2011-08-02 MED ORDER — PROPOFOL 10 MG/ML IV EMUL
INTRAVENOUS | Status: DC | PRN
  Administered 2011-08-02: 100 mg via INTRAVENOUS

## 2011-08-02 MED ORDER — FENTANYL CITRATE 0.05 MG/ML IJ SOLN
INTRAMUSCULAR | Status: DC | PRN
  Administered 2011-08-02: 50 ug via INTRAVENOUS
  Administered 2011-08-02: 100 ug via INTRAVENOUS

## 2011-08-02 MED ORDER — NEOSTIGMINE METHYLSULFATE 1 MG/ML IJ SOLN
INTRAMUSCULAR | Status: DC | PRN
  Administered 2011-08-02: 3 mg via INTRAVENOUS

## 2011-08-02 MED ORDER — ROCURONIUM BROMIDE 100 MG/10ML IV SOLN
INTRAVENOUS | Status: DC | PRN
  Administered 2011-08-02: 10 mg via INTRAVENOUS
  Administered 2011-08-02: 5 mg via INTRAVENOUS
  Administered 2011-08-02: 40 mg via INTRAVENOUS

## 2011-08-02 MED ORDER — HEMOSTATIC AGENTS (NO CHARGE) OPTIME
TOPICAL | Status: DC | PRN
  Administered 2011-08-02: 1 via TOPICAL

## 2011-08-02 MED ORDER — LACTATED RINGERS IV SOLN
INTRAVENOUS | Status: DC | PRN
  Administered 2011-08-02 (×2): via INTRAVENOUS

## 2011-08-02 MED ORDER — MIDAZOLAM HCL 5 MG/5ML IJ SOLN
INTRAMUSCULAR | Status: DC | PRN
  Administered 2011-08-02: 1 mg via INTRAVENOUS

## 2011-08-02 MED ORDER — HYDROMORPHONE HCL PF 1 MG/ML IJ SOLN: 0.2500 mg | INTRAMUSCULAR | Status: DC | PRN

## 2011-08-02 MED ORDER — EPHEDRINE SULFATE 50 MG/ML IJ SOLN
INTRAMUSCULAR | Status: DC | PRN
  Administered 2011-08-02 (×3): 5 mg via INTRAVENOUS
  Administered 2011-08-02: 10 mg via INTRAVENOUS

## 2011-08-02 MED ORDER — 0.9 % SODIUM CHLORIDE (POUR BTL) OPTIME
TOPICAL | Status: DC | PRN
  Administered 2011-08-02: 1000 mL

## 2011-08-02 MED ORDER — GLYCOPYRROLATE 0.2 MG/ML IJ SOLN
INTRAMUSCULAR | Status: DC | PRN
  Administered 2011-08-02: 0.4 mg via INTRAVENOUS

## 2011-08-02 MED ORDER — PHENYLEPHRINE HCL 10 MG/ML IJ SOLN
10.0000 mg | INTRAVENOUS | Status: DC | PRN
  Administered 2011-08-02: 40 ug/min via INTRAVENOUS

## 2011-08-02 MED ORDER — ONDANSETRON HCL 4 MG/2ML IJ SOLN: 4.0000 mg | Freq: Once | INTRAMUSCULAR | Status: DC | PRN

## 2011-08-02 MED ORDER — ONDANSETRON HCL 4 MG/2ML IJ SOLN
INTRAMUSCULAR | Status: DC | PRN
  Administered 2011-08-02: 4 mg via INTRAVENOUS

## 2011-08-02 SURGICAL SUPPLY — 66 items
BLADE SURG 10 STRL SS (BLADE) ×2 IMPLANT
BRUSH SUPERTRAX BIOPSY (INSTRUMENTS) ×2 IMPLANT
BRUSH SUPERTRAX NDL-TIP CYTO (INSTRUMENTS) ×2 IMPLANT
CANISTER SUCTION 2500CC (MISCELLANEOUS) ×2 IMPLANT
CHANNEL WORK EXTEND EDGE 180 (KITS) IMPLANT
CHANNEL WORK EXTEND EDGE 45 (KITS) IMPLANT
CHANNEL WORK EXTEND EDGE 90 (KITS) IMPLANT
CLIP TI MEDIUM 6 (CLIP) IMPLANT
CLOTH BEACON ORANGE TIMEOUT ST (SAFETY) ×2 IMPLANT
CONT SPEC 4OZ CLIKSEAL STRL BL (MISCELLANEOUS) ×10 IMPLANT
COVER SURGICAL LIGHT HANDLE (MISCELLANEOUS) ×2 IMPLANT
COVER TABLE BACK 60X90 (DRAPES) ×2 IMPLANT
DERMABOND ADVANCED (GAUZE/BANDAGES/DRESSINGS) ×1
DERMABOND ADVANCED .7 DNX12 (GAUZE/BANDAGES/DRESSINGS) ×1 IMPLANT
DRAPE C-ARM 42X72 X-RAY (DRAPES) IMPLANT
DRAPE CAMERA VIDEO/LASER (DRAPES) ×2 IMPLANT
DRAPE CHEST BREAST 15X10 FENES (DRAPES) ×2 IMPLANT
ELECT CAUTERY BLADE 6.4 (BLADE) ×2 IMPLANT
ELECT REM PT RETURN 9FT ADLT (ELECTROSURGICAL) ×2
ELECTRODE REM PT RTRN 9FT ADLT (ELECTROSURGICAL) ×1 IMPLANT
FILTER STRAW FLUID ASPIR (MISCELLANEOUS) IMPLANT
FORCEPS BIOP SUPERTRX PREMAR (INSTRUMENTS) ×2 IMPLANT
GAUZE SPONGE 2X2 8PLY STRL LF (GAUZE/BANDAGES/DRESSINGS) ×1 IMPLANT
GAUZE SPONGE 4X4 16PLY XRAY LF (GAUZE/BANDAGES/DRESSINGS) ×2 IMPLANT
GLOVE BIOGEL PI IND STRL 6.5 (GLOVE) ×2 IMPLANT
GLOVE BIOGEL PI IND STRL 7.0 (GLOVE) ×1 IMPLANT
GLOVE BIOGEL PI INDICATOR 6.5 (GLOVE) ×2
GLOVE BIOGEL PI INDICATOR 7.0 (GLOVE) ×1
GLOVE ECLIPSE 6.5 STRL STRAW (GLOVE) ×2 IMPLANT
GLOVE SURG SIGNA 7.5 PF LTX (GLOVE) ×4 IMPLANT
GOWN BRE IMP PREV XXLGXLNG (GOWN DISPOSABLE) ×2 IMPLANT
GOWN STRL NON-REIN LRG LVL3 (GOWN DISPOSABLE) ×2 IMPLANT
HEMOSTAT SURGICEL 2X14 (HEMOSTASIS) ×2 IMPLANT
KIT BASIN OR (CUSTOM PROCEDURE TRAY) ×2 IMPLANT
KIT LOCATABLE GUIDE (CANNULA) IMPLANT
KIT MARKER FIDUCIAL DELIVERY (KITS) IMPLANT
KIT PROCEDURE EDGE 180 (KITS) IMPLANT
KIT PROCEDURE EDGE 45 (KITS) IMPLANT
KIT PROCEDURE EDGE 90 (KITS) ×2 IMPLANT
KIT ROOM TURNOVER OR (KITS) ×2 IMPLANT
MARKER SKIN DUAL TIP RULER LAB (MISCELLANEOUS) ×2 IMPLANT
NEEDLE SUPERTRX PREMARK BIOPSY (NEEDLE) ×2 IMPLANT
NS IRRIG 1000ML POUR BTL (IV SOLUTION) ×2 IMPLANT
OIL SILICONE PENTAX (PARTS (SERVICE/REPAIRS)) ×2 IMPLANT
PACK SURGICAL SETUP 50X90 (CUSTOM PROCEDURE TRAY) ×2 IMPLANT
PAD ARMBOARD 7.5X6 YLW CONV (MISCELLANEOUS) ×4 IMPLANT
PATCHES PATIENT (LABEL) ×2 IMPLANT
PENCIL BUTTON HOLSTER BLD 10FT (ELECTRODE) ×2 IMPLANT
SPONGE GAUZE 2X2 STER 10/PKG (GAUZE/BANDAGES/DRESSINGS) ×1
SPONGE GAUZE 4X4 12PLY (GAUZE/BANDAGES/DRESSINGS) ×2 IMPLANT
SPONGE INTESTINAL PEANUT (DISPOSABLE) IMPLANT
SUT SILK 2 0 TIES 10X30 (SUTURE) ×2 IMPLANT
SUT VIC AB 2-0 CT1 27 (SUTURE) ×2
SUT VIC AB 2-0 CT1 TAPERPNT 27 (SUTURE) ×1 IMPLANT
SUT VIC AB 3-0 X1 27 (SUTURE) ×2 IMPLANT
SWAB COLLECTION DEVICE MRSA (MISCELLANEOUS) IMPLANT
SYR 20ML ECCENTRIC (SYRINGE) ×2 IMPLANT
SYR 30ML LL (SYRINGE) IMPLANT
SYRINGE 10CC LL (SYRINGE) ×2 IMPLANT
TAPE CLOTH SURG 4X10 WHT LF (GAUZE/BANDAGES/DRESSINGS) ×2 IMPLANT
TOWEL OR 17X24 6PK STRL BLUE (TOWEL DISPOSABLE) ×2 IMPLANT
TOWEL OR 17X26 10 PK STRL BLUE (TOWEL DISPOSABLE) ×2 IMPLANT
TRAP SPECIMEN MUCOUS 40CC (MISCELLANEOUS) ×2 IMPLANT
TUBE ANAEROBIC SPECIMEN COL (MISCELLANEOUS) IMPLANT
TUBE CONNECTING 12X1/4 (SUCTIONS) ×4 IMPLANT
WATER STERILE IRR 1000ML POUR (IV SOLUTION) ×2 IMPLANT

## 2011-08-02 NOTE — OR Nursing (Signed)
Start time for Endobronchial Navigation: [phone]

## 2011-08-02 NOTE — Discharge Instructions (Signed)
Bronchoscopy Care After These instructions give you information on caring for yourself after your procedure. Your doctor may also give you specific instructions. Call your doctor if you have any problems or questions after your procedure. HOME CARE  Do not eat or drink anything for 2 hours after your test. Your nose and throat was numbed by medicine. If you try to eat or drink before the medicine wears off, food or drink could go into your lungs.   For the rest of the first day, eat soft food and drink liquids slowly.   On the day after the test, you can go back to eating your usual food.   Do not drive or sign important papers the day of the test.   Take it easy for the next 2 days. Do not do any heavy work, exercise, or activities.   Only take medicine as told by your doctor. Do not take aspirin.   You may be drowsy for the next 24 hours.   You may see traces of blood in your spit for 1 to 2 days.  Finding out the results of your test Ask when your test results will be ready. Make sure you get your test results. GET HELP RIGHT AWAY IF:  You have breathing problems.   You have a bad sore throat for more than 1 week.   You see traces of blood in your spit for more than 3 days.   You start coughing up blood.   You have a temperature of 102 F (38.9 C) or higher.  MAKE SURE YOU:  Understand these instructions.   Will watch your condition.   Will get help right away if you are not doing well or get worse.  Document Released: 03/12/2009 Document Revised: 05/04/2011 Document Reviewed: 03/12/2009 ExitCare Patient Information 2012 [employer]. 

## 2011-08-02 NOTE — Brief Op Note (Signed)
08/02/2011  10:30 AM  PATIENT:  Margaret Hale  77 y.o. female  PRE-OPERATIVE DIAGNOSIS:  LUNG MASS  POST-OPERATIVE DIAGNOSIS:  Lung Mass  PROCEDURE:  Procedure(s) (LRB): VIDEO BRONCHOSCOPY WITH ENDOBRONCHIAL NAVIGATION (N/A) MEDIASTINOSCOPY (N/A)  SURGEON:  Surgeon(s) and Role:    * D Karle Plumber, MD - Primary  PHYSICIAN ASSISTANT:   ASSISTANTS: none   ANESTHESIA:   general  EBL:     BLOOD ADMINISTERED:none  DRAINS: none   LOCAL MEDICATIONS USED:  Amount: 0 ml and NONE  SPECIMEN:  Aspirate  DISPOSITION OF SPECIMEN:  PATHOLOGY  COUNTS:  YES  TOURNIQUET:  * No tourniquets in log *  DICTATION: .Other Dictation: Dictation Number [id number]  PLAN OF CARE: Discharge to home after PACU  PATIENT DISPOSITION:  PACU - hemodynamically stable.   Delay start of Pharmacological VTE agent (>24hrs) due to surgical blood loss or risk of bleeding: yes

## 2011-08-02 NOTE — Transfer of Care (Signed)
Immediate Anesthesia Transfer of Care Note  Patient: Margaret Hale  Procedure(s) Performed: Procedure(s) (LRB): VIDEO BRONCHOSCOPY WITH ENDOBRONCHIAL NAVIGATION (N/A) MEDIASTINOSCOPY (N/A)  Patient Location: PACU  Anesthesia Type: General  Level of Consciousness: awake, alert  and oriented  Airway & Oxygen Therapy: Patient Spontanous Breathing and Patient connected to face mask oxygen  Post-op Assessment: Report given to PACU RN, Post -op Vital signs reviewed and stable and Patient moving all extremities  Post vital signs: Reviewed and stable  Complications: No apparent anesthesia complications

## 2011-08-02 NOTE — Anesthesia Procedure Notes (Signed)
Procedure Name: Intubation Date/Time: 08/02/2011 8:45 AM Performed by: Neomia Dear, Grant Henkes K Pre-anesthesia Checklist: Patient identified, Timeout performed, Emergency Drugs available, Suction available and Patient being monitored Patient Re-evaluated:Patient Re-evaluated prior to inductionOxygen Delivery Method: Circle system utilized Preoxygenation: Pre-oxygenation with 100% oxygen Intubation Type: IV induction Ventilation: Mask ventilation without difficulty Laryngoscope Size: Mac and 3 Grade View: Grade II Tube type: Oral Tube size: 8.5 mm Number of attempts: 1 Airway Equipment and Method: Stylet and LTA kit utilized Placement Confirmation: ETT inserted through vocal cords under direct vision,  breath sounds checked- equal and bilateral and positive ETCO2 Secured at: 21 cm Tube secured with: Tape Dental Injury: Teeth and Oropharynx as per pre-operative assessment

## 2011-08-02 NOTE — H&P (View-Only) (Signed)
PCP is No primary provider on file. Referring Provider is White, Cynthia S, MD                     [address]            [place] [address]          [phone]       Chief Complaint  Patient presents with  . Lung Mass    eval and treat...CT,PET    HPI: I was asked to evaluate this 77-year-old Caucasian female ex-smoker for evaluation and treatment of a recently diagnosed left upper lobe mass. The patient developed left chest pain this past fall while doing yard work. He did not get better and she had a chest x-ray performed. The chest x-ray showed a density in the left upper lung field. A CT scan was then performed which demonstrated a 1.8 cm nodular density in the left upper lobe, a fracture of the left sixth rib and a 1 cm mass in the right middle lobe. There was AP window adenopathy as well some peritracheal adenopathy. The PET scan was then performed. This demonstrates the left upper lobe nodule a 2 cm with an SUV 7.6. The right middle lobe nodule measures 1 cm and has SUV of 5.2 SUV. Right hilar lymph node has hypermetabolic activity of 11.5 SUV. The AP window node has SUV of 9.6 and a left hilar node has a SUV of 7.7. There is no abnormal hypermetabolic activity in the abdomen. The left rib fracture had a hypermetabolic activity of 6.0 SUV. This is probably a pathologic fracture. The patient has some thyroid nodules which were not hypermetabolic. The findings on CT-PET were consistent with probable primary bronchogenic carcinoma with metastatic involvement of the mediastinum and probable contralateral of right lung.  The patient denies any primary symptoms of shortness of breath hemoptysis. She has had night sweats but relates this to a long history of perimenopausal temperature intolerance. She denies weight loss. She has had some dizziness but mainly related to orthostatic positional change. The left rib pain is persistent but not requiring narcotics.  The patient has  significant degenerative arthritis of the lower spine with sciatica-type pain and has received recent steroid injection into the epidural space. There is no abnormal activity on PET scan in this area. The patient has had a recent abnormal mammogram which was further evaluated with a right breast ultrasound which apparently showed no suspicious lesion.  Past Medical History  Diagnosis Date  . Hypertension   . Hyperlipidemia   . CAD (coronary artery disease) 2007    PCI of RCA, bare metal stent.....Dr. Turner  . Thyroid disease     nodules,stable on u/s 2/12  . Baker's cyst     L KNEE  . Vitamin d deficiency   . Disc disorder of lumbar region     2 BULDGING DISCS DX 1990 BY DR. DALLDORF    Past Surgical History  Procedure Date  . Eye surgery 01/07/09    LEFT CATARACT  . Eye surgery 01/21/09    RIGHT CATARACT  . Carpal tunnel release 06/26/08        04/20/09    RIGHT HAND          LEFT HAND  . Finger contracture release 8/12    Dr. Gramig    right hand trigger finger release    No family history on file.  Social History the patient lives by herself. She   has a son in the area and takes care of the 6-year-old grandchild. She is retired from working in merchandise in., [place]. History  Substance Use Topics  . Smoking status: Former Smoker    Quit date: 07/12/2005  . Smokeless tobacco: Not on file  . Alcohol Use: No    Current Outpatient Prescriptions  Medication Sig Dispense Refill  . amLODipine (NORVASC) 10 MG tablet Take 10 mg by mouth daily.        . aspirin 81 MG tablet Take 81 mg by mouth daily.        . cholecalciferol (VITAMIN D) 1000 UNITS tablet Take 1,000 Units by mouth daily.      . losartan-hydrochlorothiazide (HYZAAR) 100-12.5 MG per tablet Take 1 tablet by mouth daily.        . venlafaxine (EFFEXOR-XR) 37.5 MG 24 hr capsule Take 37.5 mg by mouth daily.      . cloNIDine (CATAPRES) 0.1 MG tablet Take 0.1 mg by mouth 2 (two) times daily.        . fish oil-omega-3  fatty acids 1000 MG capsule Take 1 g by mouth 3 (three) times daily.        Allergies  Allergen Reactions  . Lipitor (Atorvastatin Calcium) Other (See Comments)    MYALGIAS  . Protonix Rash    Review of Systems Constitutional review negative for fever weight loss positive for lung cancer night sweats no recent history of pneumonia Cardiac positive for stent in 2007 no subsequent angina GI negative for hepatitis jaundice blood per Endocrine negative for diabetes Hematologic negative for bleeding disorder or blood transfusion Vascular negative for DVT claudication TIA Neurologic negative for stroke or seizure  BP 146/78  Pulse 64  Resp 16  Ht 5' 4" (1.626 m)  Wt 158 lb (71.668 kg)  BMI 27.12 kg/m2  SpO2 97% Physical Exam General appearance very strong looking 77-year-old female in no distress HEENT pupils equal dentition good Neck without JVD mass adenopathy or carotid bruit Thorax without deformity tenderness over the left sixth rib laterally breath sounds clear Cardiac regular rhythm without murmur or gallop Abdomen soft nontender without pulsatile mass Extremities no clubbing cyanosis tenderness or edema Vascular positive pulses in all extremities Neurologic no focal motor deficit normal gait   Diagnostic Tests:I reviewed the CT scan and PET scan with the patient showing a probable primary left upper lobe hypermetabolic mass with AP window bilateral hilar and mediastinal adenopathy and a probable contralateral metastatic nodule in the right middle lobe   Impression: Clinical stage IV lung cancer. Tissue diagnosis needs to be obtained. I've reviewed the options of bronchoscopy and endobronchial ultrasonic transbronchial biopsy under general anesthesia, media stinoscopy  if needed, or transthoracic needle biopsy of the primary tumor. We discussed the potential risks of these procedures to include bleeding from EB US or mediastinoscopy and risk of pneumothorax from  transthoracic needle biopsy. The patient would prefer to proceed with transbronchial biopsy under general anesthesia and if that is not diagnostic then a mediastinoscopy at the same operation. This will be scheduled for Thursday, February 21 at [facility].  Plan:  

## 2011-08-02 NOTE — Anesthesia Postprocedure Evaluation (Signed)
  Anesthesia Post-op Note  Patient: Margaret Hale  Procedure(s) Performed: Procedure(s) (LRB): VIDEO BRONCHOSCOPY WITH ENDOBRONCHIAL NAVIGATION (N/A) MEDIASTINOSCOPY (N/A)  Patient Location: PACU  Anesthesia Type: General  Level of Consciousness: awake, alert  and oriented  Airway and Oxygen Therapy: Patient Spontanous Breathing and Patient connected to nasal cannula oxygen  Post-op Pain: none  Post-op Assessment: Post-op Vital signs reviewed, Patient's Cardiovascular Status Stable and PATIENT'S CARDIOVASCULAR STATUS UNSTABLE  Post-op Vital Signs: stable  Complications: No apparent anesthesia complications

## 2011-08-02 NOTE — Anesthesia Preprocedure Evaluation (Addendum)
Anesthesia Evaluation  Patient identified by MRN, date of birth, ID band Patient awake    Reviewed: Allergy & Precautions, H&P , NPO status , Patient's Chart, lab work & pertinent test results  Airway Mallampati: I      Dental  (+) Edentulous Upper   Pulmonary former smoker breath sounds clear to auscultation        Cardiovascular hypertension, Pt. on medications + CAD Rhythm:Regular Rate:Normal     Neuro/Psych    GI/Hepatic   Endo/Other    Renal/GU      Musculoskeletal   Abdominal   Peds  Hematology   Anesthesia Other Findings   Reproductive/Obstetrics                         Anesthesia Physical Anesthesia Plan  ASA: III  Anesthesia Plan: General   Post-op Pain Management:    Induction: Intravenous  Airway Management Planned: Oral ETT  Additional Equipment:   Intra-op Plan:   Post-operative Plan: Extubation in OR  Informed Consent: I have reviewed the patients History and Physical, chart, labs and discussed the procedure including the risks, benefits and alternatives for the proposed anesthesia with the patient or authorized representative who has indicated his/her understanding and acceptance.   Dental advisory given  Plan Discussed with: Anesthesiologist and Surgeon  Anesthesia Plan Comments:         Anesthesia Quick Evaluation

## 2011-08-02 NOTE — OR Nursing (Signed)
End time for Mediastinoscopy: 0924

## 2011-08-02 NOTE — Preoperative (Signed)
Beta Blockers   Reason not to administer Beta Blockers:Not Applicable 

## 2011-08-02 NOTE — Interval H&P Note (Signed)
History and Physical Interval Note:  08/02/2011 8:03 AM  Margaret Hale  has presented today for surgery, with the diagnosis of LUNG MASS  The various methods of treatment have been discussed with the patient and family. After consideration of risks, benefits and other options for treatment, the patient has consented to  Procedure(s) (LRB): VIDEO BRONCHOSCOPY WITH ENDOBRONCHIAL NAVIGATION (N/A) MEDIASTINOSCOPY (N/A) as a surgical intervention .  The patients' history has been reviewed, patient examined, no change in status, stable for surgery.  I have reviewed the patients' chart and labs.  Questions were answered to the patient's satisfaction.     Cameron Proud

## 2011-08-03 ENCOUNTER — Ambulatory Visit (INDEPENDENT_AMBULATORY_CARE_PROVIDER_SITE_OTHER): Payer: Self-pay | Admitting: Thoracic Surgery

## 2011-08-03 ENCOUNTER — Encounter: Payer: Self-pay | Admitting: Thoracic Surgery

## 2011-08-03 VITALS — BP 198/91 | HR 78 | Resp 20 | Ht 62.0 in | Wt 164.0 lb

## 2011-08-03 DIAGNOSIS — R918 Other nonspecific abnormal finding of lung field: Secondary | ICD-10-CM

## 2011-08-03 NOTE — Op Note (Signed)
NAMEJAMILETH, Margaret Hale NO.:  [id number]  MEDICAL RECORD NO.:  [id number]  LOCATION:  [facility]                         FACILITY:  [facility]  PHYSICIAN:  Ines Bloomer, M.D. DATE OF BIRTH:  1934/10/01  DATE OF PROCEDURE: DATE OF DISCHARGE:  08/02/2011                              OPERATIVE REPORT   SURGEON:  Ines Bloomer, MD  PREOPERATIVE DIAGNOSIS:  Left upper lobe mass with mediastinal adenopathy.  POSTOPERATIVE DIAGNOSIS:  Left upper lobe mass with mediastinal adenopathy.  OPERATION PERFORMED:  Mediastinoscopy with endobronchial ultrasound bronchoscopy.  DESCRIPTION OF PROCEDURE:  After general anesthesia, the chest was prepped and draped in usual sterile manner.  A transverse incision was made at the sternal notch and carried down with electrocautery through the subcutaneous tissue and fascia.  The pretracheal fascia was entered and biopsies of 4R and 4L nodes were done.  Frozen section revealed a granulomatous process.  Strap muscles were closed with 2-0 Vicryl, subcutaneous tissue with 3-0 Vicryl, and Dermabond for the skin.  The patient was then turned to supine position and the videobronchoscope was passed through the endotracheal tube and the Maverick navigator.  The extended working channel with the locatable guide was inserted and automatic registration was done.  We then navigated to the left upper lobe lesion and at that point, we first did a needle aspiration, then brushes, then biopsies, and finally another needle brush taken at least 2-3 specimens each time.  We then irrigated, did a BAL at the end of the case and then removed the extended working channel.  We put the LG in several times to correct in position and we were all within 1.2 x 0.5-cm from the lesion with the lesion being stayed on.  We confirmed where we were with fluoro.  No pneumothorax was seen on fluoro.  The videobronchoscope was removed.  The patient tolerated the  procedure well, was returned to the recovery room in stable condition.     Ines Bloomer, M.D.     DPB/MEDQ  D:  08/02/2011  T:  08/03/2011  Job:  [id number]

## 2011-08-03 NOTE — Progress Notes (Signed)
HPI the patient returns for followup mediastinoscopy revealed granulomatous process in the lymph nodes. Biopsies of the left upper lobe nodule were) inflammatory, no evidence of cancer. I feel that this is a positive PET could be follow inflammatory given the granulomatous process in the lymph nodes and all biopsies been negative. I explained this to the patient. We did send cultures. I plan to repeat her CT scan in 2 months to see if there is any change. If there is any enlargement of the lymph nodes or nodules then she'll have to repeat biopsy. She is aware of the possibility. He also complains of chronic sinusitis with and drainage. I will refer her to ENT for evaluation. Her mediastinoscopy incision was healing well. Current Outpatient Prescriptions  Medication Sig Dispense Refill  . alendronate (FOSAMAX) 70 MG tablet Take 70 mg by mouth every 7 (seven) days.       Marland Kitchen amLODipine (NORVASC) 10 MG tablet Take 10 mg by mouth every evening.       . cloNIDine (CATAPRES) 0.1 MG tablet Take 0.1 mg by mouth 2 (two) times daily.        . Hydrocodone-Acetaminophen 5-300 MG TABS Take 1 tablet by mouth 4 (four) times daily as needed.       Marland Kitchen losartan-hydrochlorothiazide (HYZAAR) 100-12.5 MG per tablet Take 1 tablet by mouth daily.        Marland Kitchen venlafaxine (EFFEXOR-XR) 37.5 MG 24 hr capsule Take 37.5 mg by mouth daily.         Review of Systems: Complains of chronic sinusitis with purulent drainage and cough   Physical Exam mediastinoscopy incision is healing well. Lungs are clear to auscultation percussion   Diagnostic Tests: Lymph node biopsy showed granulomatous process. Biopsies of left upper lobe nodules were inflammatory   Impression: Left upper lobe and right middle lobe nodules with mediastinal adenopathy positive on PET probably inflammatory  Plan: Return in 3 weeks. Return in 2 months with CT scan of chest. Refer to ENT for sinusitis.

## 2011-08-04 ENCOUNTER — Encounter (HOSPITAL_COMMUNITY): Payer: Self-pay | Admitting: Thoracic Surgery

## 2011-08-04 LAB — CULTURE, RESPIRATORY W GRAM STAIN

## 2011-08-06 LAB — TISSUE CULTURE

## 2011-08-30 ENCOUNTER — Encounter: Payer: Medicare Other | Admitting: Thoracic Surgery

## 2011-09-01 LAB — FUNGUS CULTURE W SMEAR
Fungal Smear: NONE SEEN
Fungal Smear: NONE SEEN
Fungal Smear: NONE SEEN

## 2011-09-05 ENCOUNTER — Encounter: Payer: Medicare Other | Admitting: Thoracic Surgery

## 2011-09-05 ENCOUNTER — Institutional Professional Consult (permissible substitution): Payer: Medicare Other | Admitting: Internal Medicine

## 2011-09-05 ENCOUNTER — Encounter: Payer: Self-pay | Admitting: Internal Medicine

## 2011-09-05 ENCOUNTER — Encounter: Payer: Self-pay | Admitting: Thoracic Surgery

## 2011-09-05 ENCOUNTER — Ambulatory Visit (INDEPENDENT_AMBULATORY_CARE_PROVIDER_SITE_OTHER): Payer: Medicare Other | Admitting: Thoracic Surgery

## 2011-09-05 VITALS — BP 127/73 | HR 78 | Resp 18 | Ht 62.0 in | Wt 164.0 lb

## 2011-09-05 DIAGNOSIS — R911 Solitary pulmonary nodule: Secondary | ICD-10-CM

## 2011-09-05 DIAGNOSIS — Z9889 Other specified postprocedural states: Secondary | ICD-10-CM

## 2011-09-05 NOTE — Progress Notes (Signed)
HPI the patient returns for followup. Her mediastinoscopy incision is healing. I still am concerned the chest this left upper lobe mass could possibly be cancer even though biopsies were benign. Her mediastinal nodes were all a granulomatous process. The left upper lobe process could also be granulomatous. I will repeat her CT scan in mid May in situ is any change in the left upper lobe mass if it is the same or increased in size then resection or a needle biopsy would be indicated. She will be see her pulmonologist tomorrow. She will let us know the name of the pulmonologist.   Current Outpatient Prescriptions  Medication Sig Dispense Refill  . alendronate (FOSAMAX) 70 MG tablet Take 70 mg by mouth every 7 (seven) days.       Marland Kitchen amLODipine (NORVASC) 10 MG tablet Take 10 mg by mouth every evening.       . cloNIDine (CATAPRES) 0.1 MG tablet Take 0.1 mg by mouth 2 (two) times daily.        . Hydrocodone-Acetaminophen 5-300 MG TABS Take 1 tablet by mouth 4 (four) times daily as needed.       Marland Kitchen losartan-hydrochlorothiazide (HYZAAR) 100-12.5 MG per tablet Take 1 tablet by mouth daily.        Marland Kitchen venlafaxine (EFFEXOR-XR) 37.5 MG 24 hr capsule Take 37.5 mg by mouth daily.         Review of Systems: Unchanged increasing allergies seen by Dr. Pollyann Kennedy for sinusitis   Physical Exam lungs are clear to auscultation percussion   Diagnostic Tests: None   Impression: Normal his prostate is lungs are mediastinal nodes left upper lobe mass inflammatory versus malignant chronic sinusitis treated by Dr. Pollyann Kennedy   Plan: Return in may with CT scan of chest

## 2011-09-06 ENCOUNTER — Other Ambulatory Visit: Payer: Self-pay | Admitting: Thoracic Surgery

## 2011-09-06 ENCOUNTER — Institutional Professional Consult (permissible substitution): Payer: Medicare Other | Admitting: Internal Medicine

## 2011-09-06 DIAGNOSIS — D381 Neoplasm of uncertain behavior of trachea, bronchus and lung: Secondary | ICD-10-CM

## 2011-09-07 ENCOUNTER — Other Ambulatory Visit (HOSPITAL_COMMUNITY)
Admission: RE | Admit: 2011-09-07 | Discharge: 2011-09-07 | Disposition: A | Discharge status: Home / Self Care | Payer: Medicare Other | Source: Ambulatory Visit | Attending: Obstetrics and Gynecology | Admitting: Obstetrics and Gynecology

## 2011-09-07 ENCOUNTER — Other Ambulatory Visit: Payer: Self-pay | Admitting: Obstetrics and Gynecology

## 2011-09-07 DIAGNOSIS — Z01419 Encounter for gynecological examination (general) (routine) without abnormal findings: Secondary | ICD-10-CM | POA: Insufficient documentation

## 2011-09-11 ENCOUNTER — Ambulatory Visit (INDEPENDENT_AMBULATORY_CARE_PROVIDER_SITE_OTHER): Payer: Medicare Other | Admitting: Internal Medicine

## 2011-09-11 ENCOUNTER — Encounter: Payer: Self-pay | Admitting: Internal Medicine

## 2011-09-11 VITALS — BP 112/60 | HR 40 | Temp 97.7°F | Ht 64.0 in | Wt 160.0 lb

## 2011-09-11 DIAGNOSIS — R918 Other nonspecific abnormal finding of lung field: Secondary | ICD-10-CM

## 2011-09-11 DIAGNOSIS — J449 Chronic obstructive pulmonary disease, unspecified: Secondary | ICD-10-CM

## 2011-09-11 NOTE — Patient Instructions (Signed)
I will be happy to do your follow up here after you are released by Dr Edwyna Shell if he thinks pulmonary follow up of your lung nodules is needed  Taking fosfamax risks making your swallowing or coughing worse but appears to be ok for now - there are alternatives such as a yearly shot (Reclast) that may need to be considered at a later date.  If you are still coughing after Dr Pollyann Kennedy completes your work up, I would be happy to see you for this also

## 2011-09-11 NOTE — Progress Notes (Signed)
  Subjective:    Patient ID: Margaret Hale, female    DOB: [birth year]   MRN: [id number]  HPI  27 yowf quit smoking 2007 no limiting sob with rib fx related to sudden twist doing yard work > abn cxr > eventually bx's by Edwyna Shell all showing nonspecific inflammatory changes only and referred 09/13/2011 to pulmonary clinic by Dr Cliffton Asters for second opinion.  09/11/2011 1st pulmonary eval c/o dysphagia since Fall 2012 assoc with intermittent dry cough x 3-4 years and sense of pnds on fosfamax but only for the last sev months. Cough was prod of green mucus at one time  but since seeing rosen for active sinus dz with plans to f/u p minimum of 2 weeks abx the cough is better, no purulent sputum or hemoptysis.   Sleeping ok without nocturnal  or early am exacerbation  of respiratory  c/o's or need for noct saba. Also denies any obvious fluctuation of symptoms with weather or environmental changes or other aggravating or alleviating factors except as outlined above    Review of Systems  Constitutional: Negative for fever, chills and unexpected weight change.  HENT: Positive for ear pain, congestion and trouble swallowing. Negative for nosebleeds, sore throat, rhinorrhea, sneezing, dental problem, voice change, postnasal drip and sinus pressure.   Eyes: Negative for visual disturbance.  Respiratory: Positive for cough. Negative for choking and shortness of breath.   Cardiovascular: Negative for chest pain and leg swelling.  Gastrointestinal: Negative for vomiting, abdominal pain and diarrhea.  Genitourinary: Negative for difficulty urinating.  Musculoskeletal: Negative for arthralgias.  Skin: Negative for rash.  Neurological: Negative for tremors, syncope and headaches.  Hematological: Does not bruise/bleed easily.       Objective:   Physical Exam amb wf nad Wt  160 09/11/2011  HEENT mild turbinate edema.  Oropharynx no thrush or excess pnd or cobblestoning.  No JVD or cervical adenopathy. Mild  accessory muscle hypertrophy. Trachea midline, nl thryroid. Chest was hyperinflated by percussion with diminished breath sounds and moderate increased exp time without wheeze. Hoover sign positive at mid inspiration. Regular rate and rhythm without murmur gallop or rub or increase P2 or edema.  Abd: no hsm, nl excursion. Ext warm without cyanosis or clubbing.        Assessment & Plan:

## 2011-09-13 ENCOUNTER — Encounter: Payer: Self-pay | Admitting: Internal Medicine

## 2011-09-13 DIAGNOSIS — R918 Other nonspecific abnormal finding of lung field: Secondary | ICD-10-CM | POA: Insufficient documentation

## 2011-09-13 NOTE — Assessment & Plan Note (Signed)
Most likley these are all granulomas but no way to be sure unless remove them all or follow them serially.  Discussed directly with Dr Edwyna Shell who already has a f/u plan in place and I don't recommend adding any medication at this point/ could be nodular sarcoid but no indication to treat at this point.

## 2011-09-17 DIAGNOSIS — J449 Chronic obstructive pulmonary disease, unspecified: Secondary | ICD-10-CM | POA: Insufficient documentation

## 2011-09-17 LAB — AFB CULTURE WITH SMEAR (NOT AT ARMC)
Acid Fast Smear: NONE SEEN
Acid Fast Smear: NONE SEEN

## 2011-09-17 NOTE — Assessment & Plan Note (Signed)
  When respiratory symptoms begin  well after a patient reports complete smoking cessation,  Especially when this wasn't the case while they were smoking, a red flag is raised based on the work of Dr Primitivo Gauze which states:  if you quit smoking when your best day FEV1 is still well preserved it is highly unlikely you will progress to severe disease.  That is to say, once the smoking stops,  the symptoms should not suddenly erupt or markedly worsen.  If so, the differential diagnosis should include  obesity/deconditioning,  LPR/Reflux/Aspiration syndromes,  occult CHF, or  especially side effect of medications commonly used in this population- in her case it sounds like sinus dz is the major factor, though her dysphagia also raises the issue of  Classic Upper airway cough syndrome, so named because it's frequently impossible to sort out how much is  CR/sinusitis with freq throat clearing (which can be related to primary GERD)   vs  causing  secondary (" extra esophageal")  GERD from wide swings in gastric pressure that occur with throat clearing, often  promoting self use of mint and menthol lozenges that reduce the lower esophageal sphincter tone and exacerbate the problem further in a cyclical fashion.   These are the same pts (now being labeled as having "irritable larynx syndrome" by some cough centers) who not infrequently have a history of having failed to tolerate ace inhibitors,  dry powder inhalers or biphosphonates or report having atypical reflux symptoms that don't respond to standard doses of PPI , and are easily confused as having aecopd or asthma flares by even experienced allergists/ pulmonologists.   For now rec she work with Dr Pollyann Kennedy and Edwyna Shell and return here if hot 100% satisfied p released.

## 2011-09-19 ENCOUNTER — Other Ambulatory Visit: Payer: Self-pay | Admitting: Thoracic Surgery

## 2011-10-11 ENCOUNTER — Ambulatory Visit: Payer: Medicare Other | Admitting: Thoracic Surgery

## 2011-10-11 ENCOUNTER — Ambulatory Visit
Admission: RE | Admit: 2011-10-11 | Discharge: 2011-10-11 | Disposition: A | Discharge status: Home / Self Care | Payer: Medicare Other | Source: Ambulatory Visit | Attending: Thoracic Surgery | Admitting: Thoracic Surgery

## 2011-10-11 DIAGNOSIS — D381 Neoplasm of uncertain behavior of trachea, bronchus and lung: Secondary | ICD-10-CM

## 2011-10-13 ENCOUNTER — Ambulatory Visit (INDEPENDENT_AMBULATORY_CARE_PROVIDER_SITE_OTHER): Payer: Medicare Other | Admitting: Thoracic Surgery

## 2011-10-13 ENCOUNTER — Encounter: Payer: Self-pay | Admitting: Thoracic Surgery

## 2011-10-13 ENCOUNTER — Ambulatory Visit: Payer: Medicare Other | Admitting: Thoracic Surgery

## 2011-10-13 VITALS — BP 136/79 | HR 58 | Resp 16 | Ht 68.0 in | Wt 157.0 lb

## 2011-10-13 DIAGNOSIS — D1430 Benign neoplasm of unspecified bronchus and lung: Secondary | ICD-10-CM

## 2011-10-13 DIAGNOSIS — Z09 Encounter for follow-up examination after completed treatment for conditions other than malignant neoplasm: Secondary | ICD-10-CM

## 2011-10-13 NOTE — Progress Notes (Signed)
HPI patient returns for followup today. She had a previous biopsy of the left upper lobe lesion that was showed though some reactive changes and atypical cyst and some questionable atypical cells. Followup low-dose CT scan today showed that the lesions had not changed. The left upper lobe lesion was still 16 mm in the right upper lobe lesion was 10 mm. Her mediastinoscopy was negative and it showed granulomatous disease. The patient is not interested in any further biopsies or removal at the present time. I did explain to her that these lesions get bigger than resection will need to be done. Since not be here because of my retirement, I will refer her for followup in 4 months with another low-dose CT scan. Dr. Tyrone Sage will follow her.   Current Outpatient Prescriptions  Medication Sig Dispense Refill  . amLODipine (NORVASC) 10 MG tablet Take 10 mg by mouth every evening.       Marland Kitchen aspirin 81 MG tablet Take 81 mg by mouth daily.      . Calcium 1500 MG tablet Take 1,500 mg by mouth daily.      . cloNIDine (CATAPRES) 0.1 MG tablet Take 0.1 mg by mouth 2 (two) times daily.        Marland Kitchen losartan-hydrochlorothiazide (HYZAAR) 100-12.5 MG per tablet Take 1 tablet by mouth daily.        . Omega-3 Fatty Acids (FISH OIL PO) Take 1 capsule by mouth daily.      Marland Kitchen venlafaxine (EFFEXOR-XR) 37.5 MG 24 hr capsule Take 37.5 mg by mouth daily.      . vitamin E 400 UNIT capsule Take 800 Units by mouth daily.         Review of Systems: Unchanged   Physical Exam lungs are clear to auscultation percussion   Diagnostic Tests: CT scan shows no change in left upper lobe and right upper lobe lesions   Impression: Bilateral pulmonary nodules unchanged granulomatous disease in mediastinal nodes   Plan: Followup for a months with low-dose CT scan by Dr. Tyrone Sage

## 2012-01-03 ENCOUNTER — Other Ambulatory Visit: Payer: Self-pay | Admitting: Cardiothoracic Surgery

## 2012-01-03 DIAGNOSIS — D381 Neoplasm of uncertain behavior of trachea, bronchus and lung: Secondary | ICD-10-CM

## 2012-02-15 ENCOUNTER — Ambulatory Visit
Admission: RE | Admit: 2012-02-15 | Discharge: 2012-02-15 | Disposition: A | Discharge status: Home / Self Care | Payer: Medicare Other | Source: Ambulatory Visit | Attending: Cardiothoracic Surgery | Admitting: Cardiothoracic Surgery

## 2012-02-15 ENCOUNTER — Ambulatory Visit (INDEPENDENT_AMBULATORY_CARE_PROVIDER_SITE_OTHER): Payer: Medicare Other | Admitting: Cardiothoracic Surgery

## 2012-02-15 ENCOUNTER — Encounter: Payer: Self-pay | Admitting: Cardiothoracic Surgery

## 2012-02-15 VITALS — BP 143/80 | HR 70 | Resp 14 | Ht 64.0 in | Wt 154.0 lb

## 2012-02-15 DIAGNOSIS — D381 Neoplasm of uncertain behavior of trachea, bronchus and lung: Secondary | ICD-10-CM

## 2012-02-15 DIAGNOSIS — D1430 Benign neoplasm of unspecified bronchus and lung: Secondary | ICD-10-CM

## 2012-02-15 DIAGNOSIS — R918 Other nonspecific abnormal finding of lung field: Secondary | ICD-10-CM

## 2012-02-15 NOTE — Progress Notes (Signed)
[address]            [place] [address]          [phone]      Margaret Hale [facility] Medical Record #[id number] Date of Birth: 08/21/1934  Referring: Cala Bradford, MD Primary Care: Cala Bradford, MD  Chief Complaint:    Chief Complaint  Patient presents with  . Follow-up    4 MONTH FU CT CHEST    History of Present Illness:    She had a previous biopsy of the left upper lobe lesion that was showed though some reactive changes and atypical cyst and some questionable atypical cells. Followup low-dose CT scan today showed that the lesions had not changed. The left upper lobe lesion was still 16 mm in the right upper lobe lesion was 10 mm. Her mediastinoscopy was negative and it showed granulomatous disease. The patient was seen by Dr Edwyna Shell in May 2013 and was  not interested in any further biopsies or removal at the present time. Dr Edwyna Shell  did explain to her that if  these lesions get bigger than resection will need to be done.   She retuns today with follow CT scan. She notes that with her own treatment of Kale and lime drinks she feels better and has much less pulmonary cogestion   Current Activity/ Functional Status: Patient is independent with mobility/ambulation, transfers, ADL's, IADL's.   Past Medical History  Diagnosis Date  . Hyperlipidemia   . CAD (coronary artery disease) 2007    PCI of RCA, bare metal stent.....Dr. Mayford Knife  . Thyroid disease     nodules,stable on u/s 2/12  . Baker's cyst     L KNEE  . Vitamin d deficiency   . Disc disorder of lumbar region     2 BULDGING DISCS DX 1990 BY DR. DALLDORF  . No pertinent past medical history     lung mass  occas cough   . Dizzy   . Hypertension     Takes Novasc ,Clonidine, & Losartan  . Cancer     lung mass    Past Surgical History  Procedure Date  . Carpal tunnel release 06/26/08        04/20/09    RIGHT HAND          LEFT HAND  . Finger contracture  release 8/12    Dr. Amanda Pea    right hand trigger finger release  . Eye surgery 01/07/09    LEFT CATARACT  . Eye surgery 01/21/09    RIGHT CATARACT  . Coronary angioplasty     dr h Katrinka Blazing [phone] with one stent  . Mediastinoscopy 08/02/2011    Procedure: MEDIASTINOSCOPY;  Surgeon: Ines Bloomer, MD;  Location: [facility] OR;  Service: Thoracic;  Laterality: N/A;    Family History  Problem Relation Age of Onset  . Anesthesia problems Neg Hx   . Hypotension Neg Hx   . Malignant hyperthermia Neg Hx   . Pseudochol deficiency Neg Hx     History   Social History  . Marital Status: Widowed    Spouse Name: N/A    Number of Children: 2  . Years of Education: N/A   Occupational History  . Retired    Social History Main Topics  . Smoking status: Former Smoker -- 0.5 packs/day for 10 years    Types: Cigarettes  Quit date: 07/12/2005  . Smokeless tobacco: Never Used  . Alcohol Use: No  . Drug Use: No  . Sexually Active: Not on file   Other Topics Concern  . Not on file   Social History Narrative  . No narrative on file    History  Smoking status  . Former Smoker -- 0.5 packs/day for 10 years  . Types: Cigarettes  . Quit date: 07/12/2005  Smokeless tobacco  . Never Used    History  Alcohol Use No     Allergies  Allergen Reactions  . Food     Sausage, hot dogs, salami (rash)  . Lipitor (Atorvastatin Calcium) Other (See Comments)    MYALGIAS  . Pantoprazole Sodium Rash    Current Outpatient Prescriptions  Medication Sig Dispense Refill  . amLODipine (NORVASC) 10 MG tablet Take 10 mg by mouth every evening.       Marland Kitchen aspirin 81 MG tablet Take 81 mg by mouth daily.      . Calcium 1500 MG tablet Take 1,500 mg by mouth daily.      . cloNIDine (CATAPRES) 0.1 MG tablet Take 0.1 mg by mouth 2 (two) times daily.        Marland Kitchen losartan-hydrochlorothiazide (HYZAAR) 100-12.5 MG per tablet Take 1 tablet by mouth daily.        . Omega-3 Fatty Acids (FISH OIL PO) Take 1 capsule by mouth  daily.      Marland Kitchen venlafaxine (EFFEXOR-XR) 37.5 MG 24 hr capsule Take 37.5 mg by mouth daily.      . vitamin E 400 UNIT capsule Take 800 Units by mouth daily.           Review of Systems:     Cardiac Review of Systems: Y or N  Chest Pain [  n  ]  Resting SOB [n   ] Exertional SOB  [ n ]  Orthopnea [n  ]   Pedal Edema [ n  ]    Palpitations [ n ] Syncope  [n  ]   Presyncope [ n  ]  General Review of Systems: [Y] = yes [  ]=no Constitional: recent weight change [  ]; anorexia [  ]; fatigue [ n ]; nausea [n  ]; night sweats [ n ]; fever [ n ]; or chills [n  ];                                                                                                                                          Dental: poor dentition[n  ];   Eye : blurred vision [  ]; diplopia [   ]; vision changes [  ];  Amaurosis fugax[  ]; Resp: cough [ y ];  wheezing[ n ];  hemoptysis[n  ]; shortness of breath[n ]; paroxysmal nocturnal dyspnea[ n]; dyspnea on exertion[  ]; or orthopnea[  ];  GI:  gallstones[  ], vomiting[  ];  dysphagia[  ]; melena[  ];  hematochezia [  ]; heartburn[  ];   Hx of  Colonoscopy[  ]; GU: kidney stones [  ]; hematuria[  ];   dysuria [  ];  nocturia[  ];  history of     obstruction [  ];             Skin: rash, swelling[  ];, hair loss[  ];  peripheral edema[  ];  or itching[  ]; Musculosketetal: myalgias[  ];  joint swelling[  ];  joint erythema[  ];  joint pain[  ];  back pain[  ];  Heme/Lymph: bruising[  ];  bleeding[  ];  anemia[  ];  Neuro: TIA[  ];  headaches[  ];  stroke[  ];  vertigo[  ];  seizures[  ];   paresthesias[  ];  difficulty walking[  ];  Psych:depression[  ]; anxiety[  ];  Endocrine: diabetes[  ];  thyroid dysfunction[  ];  Immunizations: Flu [ unknown ]; Pneumococcal[unknown  ];  Other:  Physical Exam: BP 143/80  Pulse 70  Resp 14  Ht 5\' 4"  (1.626 m)  Wt 154 lb (69.854 kg)  BMI 26.43 kg/m2  SpO2 97%  General appearance: alert, cooperative, appears stated age and no  distress Neurologic: intact Heart: regular rate and rhythm, S1, S2 normal, no murmur, click, rub or gallop and normal apical impulse Lungs: clear to auscultation bilaterally and normal percussion bilaterally Abdomen: soft, non-tender; bowel sounds normal; no masses,  no organomegaly Extremities: extremities normal, atraumatic, no cyanosis or edema, Homans sign is negative, no sign of DVT and no edema, redness or tenderness in the calves or thighs no cervicall or axillary adenopathy   Diagnostic Studies & Laboratory data:     Recent Radiology Findings:   Ct Chest Low Dose Pilot W/o Cm  02/15/2012  *RADIOLOGY REPORT*  Clinical Data: Follow up of lung nodules, prior mediastinoscopy with endobronchial ultrasound bronchoscopy and biopsy  CT CHEST LOW DOSE PILOT WITHOUT CONTRAST  Technique: Multidetector CT imaging of the chest using the standard low-dose protocol without administration of intravenous contrast.  Comparison: CT chest of 06/29/2011 and PET CT of 07/07/2011  Findings: The lobular nodule within the left upper lobe abutting the major fissure noted previously is not currently seen.  Only minimal linear scarring is present at that site on image number 116.  A tiny 4 mm noncalcified nodule in the left upper lobe is stable.  In addition, the right middle lobe nodule in the anterior - inferior right middle lobe is stable measuring 9 x 10 mm.  No new pulmonary nodule is seen.  No pleural effusion is noted.  Biapical pleuroparenchymal scarring is unchanged.  The airway appears patent.  On bone window images there appears to be healed left posterolateral sixth rib fracture.  On soft tissue window images, the enlarged nodular right thyroid lobe is unchanged on this unenhanced study.  Previously noted mediastinal and hilar nodes appear stable although not as well visualized without IV contrast media.  Coronary artery calcifications are present.  IMPRESSION:  1.  The lobular nodule noted previously in the  left upper lobe abutting the fissure is no longer seen with minimal scarring at that site. 2.  Stable 10 mm right middle lobe nodule. 3.  Stable 4 mm left upper lobe nodule. 4.  No new lung nodule is seen.   Original Report Authenticated By: Juline Patch, M.D.  Recent Lab Findings: Lab Results  Component Value Date   WBC 5.5 08/01/2011   HGB 13.7 08/01/2011   HCT 39.4 08/01/2011   PLT 172 08/01/2011   GLUCOSE 97 08/01/2011   ALT 16 08/01/2011   AST 16 08/01/2011   NA 140 08/01/2011   K 3.5 08/01/2011   CL 103 08/01/2011   CREATININE 0.60 08/01/2011   BUN 13 08/01/2011   CO2 29 08/01/2011   INR 0.98 08/01/2011      Assessment / Plan:     Presumed inflammatory process with dominant left pulmonary nodule has resolved. Will plan to see back in 6 months with repeat low dose CT of chest      Delight Ovens MD  Beeper [phone] Office [phone] 02/15/2012 11:36 AM

## 2012-02-15 NOTE — Patient Instructions (Signed)
Ct scan, left lung nodule resolved  RADIOLOGY REPORT* 02/15/2012 Clinical Data: Follow up of lung nodules, prior mediastinoscopy  with endobronchial ultrasound bronchoscopy and biopsy  CT CHEST LOW DOSE PILOT WITHOUT CONTRAST  Technique: Multidetector CT imaging of the chest using the standard  low-dose protocol without administration of intravenous contrast.  Comparison: CT chest of 06/29/2011 and PET CT of 07/07/2011  Findings: The lobular nodule within the left upper lobe abutting  the major fissure noted previously is not currently seen. Only  minimal linear scarring is present at that site on image number  116. A tiny 4 mm noncalcified nodule in the left upper lobe is  stable. In addition, the right middle lobe nodule in the anterior  - inferior right middle lobe is stable measuring 9 x 10 mm. No new  pulmonary nodule is seen. No pleural effusion is noted. Biapical  pleuroparenchymal scarring is unchanged. The airway appears  patent. On bone window images there appears to be healed left  posterolateral sixth rib fracture.  On soft tissue window images, the enlarged nodular right thyroid  lobe is unchanged on this unenhanced study. Previously noted  mediastinal and hilar nodes appear stable although not as well  visualized without IV contrast media. Coronary artery  calcifications are present.  IMPRESSION:  1. The lobular nodule noted previously in the left upper lobe  abutting the fissure is no longer seen with minimal scarring at  that site.  2. Stable 10 mm right middle lobe nodule.  3. Stable 4 mm left upper lobe nodule.  4. No new lung nodule is seen.  Original Report Authenticated By: Juline Patch, M.D.

## 2012-02-28 ENCOUNTER — Other Ambulatory Visit: Payer: Self-pay | Admitting: Gastroenterology

## 2012-05-07 ENCOUNTER — Other Ambulatory Visit: Payer: Self-pay | Admitting: Obstetrics and Gynecology

## 2012-05-07 DIAGNOSIS — Z1231 Encounter for screening mammogram for malignant neoplasm of breast: Secondary | ICD-10-CM

## 2012-06-21 ENCOUNTER — Ambulatory Visit
Admission: RE | Admit: 2012-06-21 | Discharge: 2012-06-21 | Disposition: A | Discharge status: Home / Self Care | Payer: Medicare Other | Source: Ambulatory Visit | Attending: Obstetrics and Gynecology | Admitting: Obstetrics and Gynecology

## 2012-06-21 DIAGNOSIS — Z1231 Encounter for screening mammogram for malignant neoplasm of breast: Secondary | ICD-10-CM

## 2012-07-08 ENCOUNTER — Other Ambulatory Visit: Payer: Self-pay

## 2012-07-08 DIAGNOSIS — D381 Neoplasm of uncertain behavior of trachea, bronchus and lung: Secondary | ICD-10-CM

## 2012-08-15 ENCOUNTER — Encounter: Payer: Self-pay | Admitting: Cardiothoracic Surgery

## 2012-08-15 ENCOUNTER — Ambulatory Visit
Admission: RE | Admit: 2012-08-15 | Discharge: 2012-08-15 | Disposition: A | Discharge status: Home / Self Care | Payer: Medicare Other | Source: Ambulatory Visit | Attending: Cardiothoracic Surgery | Admitting: Cardiothoracic Surgery

## 2012-08-15 ENCOUNTER — Ambulatory Visit (INDEPENDENT_AMBULATORY_CARE_PROVIDER_SITE_OTHER): Payer: Medicare Other | Admitting: Cardiothoracic Surgery

## 2012-08-15 VITALS — BP 117/66 | HR 73 | Resp 20 | Ht 64.0 in | Wt 154.0 lb

## 2012-08-15 DIAGNOSIS — R918 Other nonspecific abnormal finding of lung field: Secondary | ICD-10-CM

## 2012-08-15 DIAGNOSIS — D381 Neoplasm of uncertain behavior of trachea, bronchus and lung: Secondary | ICD-10-CM

## 2012-08-15 NOTE — Progress Notes (Signed)
[address]       [place] [address]             [phone]       Gracelynn Bircher [facility] Medical Record #[id number] Date of Birth: 1935-04-02  Referring: Cala Bradford, MD Primary Care: Cala Bradford, MD  Chief Complaint:    Chief Complaint  Patient presents with  . Follow-up    6 month f/u with Chest CT,   . Lung Lesion    surveillance of lung nodules    History of Present Illness:    She had a previous biopsy of the left upper lobe lesion that was showed though some reactive changes and atypical cyst and some questionable atypical cells. Followup low-dose CT scan today showed that the lesions had not changed. The left upper lobe lesion was still 16 mm in the right upper lobe lesion was 10 mm. Her mediastinoscopy was negative and it showed granulomatous disease. The patient was seen by Dr Edwyna Shell in May 2013 and was  not interested in any further biopsies or removal at the present time. Dr Edwyna Shell  did explain to her that if  these lesions get bigger than resection will need to be done.   I saw her 6 months ago with a followup CT scan that showed the lobular nodule within the left upper lobe abutting the major fissure noted previously was not  seen. Only minimal linear scarring is present at that site on image number 116. A tiny 4 mm noncalcified nodule in the left upper lobe is stable. In addition, nodule in the anterior  inferior right middle lobe is stable measuring 9 x 10 mm  -  She retuns today with follow CT scan.  No anginal type pains, even with daily exercise denies angina  On amoxicillin for nasal congestion improving  Current Activity/ Functional Status: Patient is independent with mobility/ambulation, transfers, ADL's, IADL's.   Past Medical History  Diagnosis Date  . Hyperlipidemia   . CAD (coronary artery disease) 2007    PCI of RCA, bare metal stent.....Dr. Mayford Knife  . Thyroid disease     nodules,stable on u/s  2/12  . Baker's cyst     L KNEE  . Vitamin D deficiency   . Disc disorder of lumbar region     2 BULDGING DISCS DX 1990 BY DR. DALLDORF  . No pertinent past medical history     lung mass  occas cough   . Dizzy   . Hypertension     Takes Novasc ,Clonidine, & Losartan  . Cancer     lung mass    Past Surgical History  Procedure Laterality Date  . Carpal tunnel release  06/26/08        04/20/09    RIGHT HAND          LEFT HAND  . Finger contracture release  8/12    Dr. Amanda Pea    right hand trigger finger release  . Eye surgery  01/07/09    LEFT CATARACT  . Eye surgery  01/21/09    RIGHT CATARACT  . Coronary angioplasty      dr h Katrinka Blazing [phone] with one stent  . Mediastinoscopy  08/02/2011    Procedure: MEDIASTINOSCOPY;  Surgeon: Ines Bloomer, MD;  Location: [facility] OR;  Service: Thoracic;  Laterality: N/A;    Family History  Problem Relation Age  of Onset  . Anesthesia problems Neg Hx   . Hypotension Neg Hx   . Malignant hyperthermia Neg Hx   . Pseudochol deficiency Neg Hx     History   Social History  . Marital Status: Widowed    Spouse Name: N/A    Number of Children: 2  . Years of Education: N/A   Occupational History  . Retired    Social History Main Topics  . Smoking status: Former Smoker -- 0.50 packs/day for 10 years    Types: Cigarettes    Quit date: 07/12/2005  . Smokeless tobacco: Never Used  . Alcohol Use: No  . Drug Use: No  . Sexually Active: Not on file   Other Topics Concern  . Not on file   Social History Narrative  . No narrative on file    History  Smoking status  . Former Smoker -- 0.50 packs/day for 10 years  . Types: Cigarettes  . Quit date: 07/12/2005  Smokeless tobacco  . Never Used    History  Alcohol Use No     Allergies  Allergen Reactions  . Food     Sausage, hot dogs, salami (rash)  . Lipitor (Atorvastatin Calcium) Other (See Comments)    MYALGIAS  . Pantoprazole Sodium Rash    Current Outpatient Prescriptions    Medication Sig Dispense Refill  . amLODipine (NORVASC) 10 MG tablet Take 10 mg by mouth every evening.       Marland Kitchen amoxicillin (AMOXIL) 500 MG capsule Take 500 mg by mouth 2 (two) times daily.       Marland Kitchen aspirin 81 MG tablet Take 81 mg by mouth daily.      . Calcium 1500 MG tablet Take 1,500 mg by mouth daily.      . cloNIDine (CATAPRES) 0.1 MG tablet Take 0.1 mg by mouth 2 (two) times daily.        Marland Kitchen losartan-hydrochlorothiazide (HYZAAR) 100-12.5 MG per tablet Take 1 tablet by mouth daily.        . Omega-3 Fatty Acids (FISH OIL PO) Take 1 capsule by mouth daily.      . pravastatin (PRAVACHOL) 40 MG tablet Take 20 mg by mouth daily.       Marland Kitchen rOPINIRole (REQUIP) 0.25 MG tablet Take 0.25 mg by mouth at bedtime.       Marland Kitchen venlafaxine (EFFEXOR-XR) 37.5 MG 24 hr capsule Take 37.5 mg by mouth daily.      . vitamin E 400 UNIT capsule Take 800 Units by mouth daily.       No current facility-administered medications for this visit.       Review of Systems:     Cardiac Review of Systems: Y or N  Chest Pain [  n  ]  Resting SOB [n   ] Exertional SOB  [ n ]  Orthopnea [n  ]   Pedal Edema [ n  ]    Palpitations [ n ] Syncope  [n  ]   Presyncope [ n  ]  General Review of Systems: [Y] = yes [  ]=no Constitional: recent weight change [  ]; anorexia [  ]; fatigue [ n ]; nausea [n  ]; night sweats [ n ]; fever [ n ]; or chills [n  ];  Dental: poor dentition[n  ];   Eye : blurred vision [  ]; diplopia [   ]; vision changes [  ];  Amaurosis fugax[  ]; Resp: cough [ y ];  wheezing[ n ];  hemoptysis[n  ]; shortness of breath[n ]; paroxysmal nocturnal dyspnea[ n]; dyspnea on exertion[  ]; or orthopnea[  ];  GI:  gallstones[  ], vomiting[  ];  dysphagia[  ]; melena[  ];  hematochezia [  ]; heartburn[  ];   Hx of  Colonoscopy[ yes last year ]; GU: kidney stones [  ]; hematuria[  ];    dysuria [  ];  nocturia[  ];  history of     obstruction [  ];             Skin: rash, swelling[  ];, hair loss[  ];  peripheral edema[  ];  or itching[  ]; Musculosketetal: myalgias[  ];  joint swelling[  ];  joint erythema[  ];  joint pain[  ];  back pain[  ];  Heme/Lymph: bruising[  ];  bleeding[  ];  anemia[  ];  Neuro: TIA[  ];  headaches[  ];  stroke[  ];  vertigo[  ];  seizures[  ];   paresthesias[  ];  difficulty walking[  ]; restless legs at night better on medsy  Psych:depression[  ]; anxiety[  ];  Endocrine: diabetes[  ];  thyroid dysfunction[  ];  Immunizations: Flu [ yes ]; Pneumococcal[unknown  ];  Other:  Physical Exam: BP 117/66  Pulse 73  Resp 20  Ht 5\' 4"  (1.626 m)  Wt 154 lb (69.854 kg)  BMI 26.42 kg/m2  SpO2 96%  General appearance: alert, cooperative, appears stated age and no distress Neurologic: intact Heart: regular rate and rhythm, S1, S2 normal, no murmur, click, rub or gallop and normal apical impulse Lungs: clear to auscultation bilaterally and normal percussion bilaterally Abdomen: soft, non-tender; bowel sounds normal; no masses,  no organomegaly Extremities: extremities normal, atraumatic, no cyanosis or edema, Homans sign is negative, no sign of DVT and no edema, redness or tenderness in the calves or thighs no cervicall or axillary adenopathy No carotid bruits  Diagnostic Studies & Laboratory data:     Recent Radiology Findings:   Ct Chest Low Dose Pilot W/o Cm  08/15/2012  *RADIOLOGY REPORT*  Clinical Data: History of smoking.  Pulmonary nodules noted on prior chest CT 02/15/2012.  Follow-up examination.  CT CHEST LOW DOSE PILOT WITHOUT CONTRAST  Technique: Multidetector CT imaging of the chest using the standard low-dose protocol without administration of intravenous contrast.  Comparison: Chest CT 02/15/2012.  Findings:  Mediastinum: Heart size is normal. Small amount of pericardial fluid and/or thickening is unlikely to be of any hemodynamic  significance of this time.  No associated pericardial calcification. There is atherosclerosis of the thoracic aorta, the great vessels of the mediastinum and the coronary arteries, including calcified atherosclerotic plaque in the left main, left anterior descending, left circumflex and right coronary arteries. Calcifications of the mitral annulus. No pathologically enlarged mediastinal or hilar lymph nodes. Please note that accurate exclusion of hilar adenopathy is limited on noncontrast CT scans. Esophagus is unremarkable in appearance.  Lungs/Pleura: Previously described 4 mm nodule in the left upper lobe is unchanged (image 122 of series 3) measuring only 4 mm on today's examination.  Previously noted right middle lobe nodule abutting the minor fissure is unchanged measuring 10 x 9 mm (image 138 of series 3).  No other  definite new or enlarging suspicious appearing pulmonary nodules or masses are otherwise noted. Bilateral apical nodular areas of pleuroparenchymal thickening are unchanged compared to prior examinations and most compatible with areas of chronic scarring.  No acute consolidative airspace disease.  No pleural effusions.  There is a background of mild diffuse bronchial wall thickening with mild centrilobular and paraseptal emphysema.  Upper Abdomen: Unremarkable.  Musculoskeletal: There are no aggressive appearing lytic or blastic lesions noted in the visualized portions of the skeleton.  IMPRESSION: 1.  No change in previously noted 10 x 9 mm right middle lobe nodule or 4 mm nodule in the lingula when compared to remote prior examination from 06/29/2011.  Based on the behavior of these nodules, these are favored to be benign, however, continued attention on follow-up studies is recommended. At this time, a 1 year follow-up chest CT in March 2015 is recommended. 2.  Atherosclerosis, including left main and three-vessel coronary artery disease.  Assessment for potential risk factor modification,  dietary therapy or pharmacologic therapy may be warranted, if clinically indicated.  3.  Mild diffuse bronchial wall thickening with mild centrilobular paraseptal emphysema.   Original Report Authenticated By: Trudie Reed, M.D.       Recent Lab Findings: Lab Results  Component Value Date   WBC 5.5 08/01/2011   HGB 13.7 08/01/2011   HCT 39.4 08/01/2011   PLT 172 08/01/2011   GLUCOSE 97 08/01/2011   ALT 16 08/01/2011   AST 16 08/01/2011   NA 140 08/01/2011   K 3.5 08/01/2011   CL 103 08/01/2011   CREATININE 0.60 08/01/2011   BUN 13 08/01/2011   CO2 29 08/01/2011   INR 0.98 08/01/2011      Assessment / Plan:   Presumed inflammatory process with dominant left pulmonary nodule that was not seen 01/2012 ct scan,   No change in previously noted 10 x 9 mm right middle lobe nodule or 4 mm nodule in the lingula when compared to remote prior examination from 06/29/2011 Atherosclerosis, including left main and three-vessel coronary artery disease, incidental noted on CT.  Known CAD with previous stent RCA, Followed by Dr Mayford Knife Will plan to see back in 12 months with repeat low dose CT of chest Pneumococcal vaccination unknown,  patient will discuss with primary care, Flu vac up to date,     Delight Ovens MD  Beeper [phone] Office [phone] 08/15/2012 11:21 AM

## 2012-08-15 NOTE — Patient Instructions (Signed)
Pulmonary Nodule A pulmonary (lung) nodule is small, round growth in the lung. The size of a pulmonary nodule can be as small as a pencil eraser (1/5 inch or 4 millmeters) to a little bigger than your biggest toenail (1 inch or 25 millimeters). A pulmonary nodule is usually an unplanned finding. It may be found on a chest X-ray or a computed tomography (CT) scan when you have imaging tests of your lungs done. When a pulmonary nodule is found, tests will be done to determine if the nodule is benign (not cancerous) or malignant (cancerous). Follow-up treatment or testing is based on the size of the pulmonary nodule and your risk of getting lung cancer.  CAUSES Causes of pulmonary nodules can vary.  Benign pulmonary nodules  can be caused from different things. Some of these things include:  Infection. This can be a common cause of a benign pulmonary nodule. The infection may be active (a current infection) or an old infection that is no longer active. Three types of infections can cause a pulmonary nodule. These are:  Bacterial Infection.  Fungal infection.  Viral Infections.  Hematoma. This is a bruise in the lung. A hematoma can happen from an injury to your chest.  Some common diseases can lead to benign pulmonary nodules. For example, rheumatoid arthritis can be a cause of a pulmonary nodule.  Other unusual things can cause a benign pulmonary nodule. These can include:  Having had tuberculosis.  Rare diseases, such as a lung cyst. Malignant pulmonary nodules.  These are cancerous growths. The cancer may have:  Started in the lung. Some lung cancers first detected as a pulmonary nodule.  Spread to the lung from cancer somewhere else in the body. This is called metastatic cancer.  Certain risk factors make a cancerous pulmonary nodule more likely. They include:  Age. As people get older, a pulmonary nodule is more likely to be cancerous.  Cancer history. If one of your immediate  family members has had cancer, you have a higher risk of developing cancer.  Smoking. This includes people who currently smoke and those who have quit. DIAGNOSIS To diagnose whether a pulmonary nodule is benign or malignant, a variety of tests will be done. This includes things such as:  Health history. Questions regarding your current health, past health, and family health will be asked.  Blood tests. Results of blood work can show:  Tumor markers for cancer.  Any type of infection.  A skin test called a tuberculin (TB) test may be done. This test can tell if you have been exposed to the germ that causes tuberculosis.  Imaging tests. These take pictures of your lungs. Types of imaging tests include:  Chest X-ray. This can help in several ways. An X-ray gives a close-up look at the pulmonary nodule. A new X-ray can be compared with any X-rays you have had in the past.   Computed tomography  (CT) scan. This test shows smaller pulmonary nodules more clearly than an X-ray.  Positron emission tomography  (PET) scan. This is a test that uses a radioactive substance to identify a pulmonary nodule. A safe amount of radioactive substance is injected into the blood stream. Then, the scan takes a picture of the pulmonary nodule. A malignant pulmonary nodule will absorb the substance faster than a benign pulmonary nodule. The radioactive substance is eliminated from your body in your urine.  Biopsy.  This removes a tiny piece of the pulmonary nodule so it can be checked   under a microscope. Medicine will be given to help keep you relaxed and pain free when a biopsy is done. Types of biopsies include:  Bronchoscopy . This is a surgical procedure. It can be used for pulmonary nodules that are close to the airways in the lung. It uses a scope (a thin tube) with a tiny camera and light on the end. The scope is put in the windpipe. Your caregiver can then see inside the lung. A tiny tool put through the  scope is used to take a small sample of the pulmonary nodule tissue.  Transthoracic needle aspiration . This method is used if the pulmonary nodule is far away from the air passages in the lung. A long, thin needle is put through the chest into the lung nodule. A CT scan is done at the same time which can make it easier to locate the pulmonary nodule.  Surgical lung biopsy . This is a surgical procedure in which the pulmonary nodule is removed. This is usually recommended when the pulmonary nodule is most likely malignant or a biopsy cannot be obtained by either bronchoscopy or transthoracic needle aspiration. PULMONARY NODULE FOLLOW-UP RECOMMENDATIONS The frequency of pulmonary nodule follow-up is based on your risk factors and size of the pulmonary nodule. If your caregiver suspects the pulmonary nodule is cancerous or the pulmonary nodule changes during any of the follow-up CT scans, additional testing or biopsies will be done.   If you have no or low risk of getting lung cancer (non-smoker, no personal cancer history), recommended follow-up is based on the following pulmonary nodule size:  A pulmonary nodule that is < 4 mm does not require any follow-up.  A pulmonary nodule that is 4 to 6 mm should be re-imaged by CT scan in 12 months.  A pulmonary nodule that is 6 to 8 mm should be re-imaged by CT scan at 6 to 12 months and then again at 18 to 24 months if no change in size.  A pulmonary nodule > 8 mm in size should be followed closely and re-imaged by CT scan at 3, 9, and 24 months.   If you are at risk of getting lung cancer (current or former smoker, family history of cancer), recommended follow-up is based on the following pulmonary nodule size:  A pulmonary nodule that is < 4 mm in size should be re-imaged by CT scan in 12 months.  A pulmonary nodule that is 4 to 6 mm in size should be re-imaged by CT scan at 6 to 12 months and again at 18 to 24 months.  A pulmonary nodule that is  6 to 8 mm in size should be re-imaged by CT scan at 3, 9, and 24 months.  A pulmonary nodule > 8 mm in size should be followed closely and re-imaged by CT scan at 3, 9, and 24 months. SEEK MEDICAL CARE IF: While waiting for test results to determine what type of pulmonary nodule you have, be sure to contact your caregiver if you:  Have trouble breathing when you are active.  Feel sick or unusually tired.  Do not feel like eating.  Lose weight without trying to.  Develop chills or night sweats.  Mild or moderate fevers generally have no long-term effects and often do not require treatment. There are a few exceptions (see below). SEEK IMMEDIATE MEDICAL CARE IF:  You cannot catch your breath or you begin wheezing.  You cannot stop coughing.  You cough up blood.    You feel like you are going to pass out or become dizzy.  You have sudden chest pain.  You have a fever or persistent symptoms for more than 72 hours.  You have a fever and your symptoms suddenly get worse. MAKE SURE YOU   Understand these instructions.  Will watch your condition.  Will get help right away if you are not doing well or get worse. Document Released: 03/12/2009 Document Revised: 08/07/2011 Document Reviewed: 03/12/2009 [employer] Patient Information 2013 [employer].  

## 2013-06-30 ENCOUNTER — Other Ambulatory Visit: Payer: Self-pay

## 2013-06-30 DIAGNOSIS — Z1231 Encounter for screening mammogram for malignant neoplasm of breast: Secondary | ICD-10-CM

## 2013-07-23 ENCOUNTER — Ambulatory Visit: Payer: Medicare Other

## 2013-07-23 ENCOUNTER — Ambulatory Visit
Admission: RE | Admit: 2013-07-23 | Discharge: 2013-07-23 | Disposition: A | Discharge status: Home / Self Care | Payer: Self-pay | Source: Ambulatory Visit

## 2013-07-23 DIAGNOSIS — Z1231 Encounter for screening mammogram for malignant neoplasm of breast: Secondary | ICD-10-CM

## 2013-07-31 ENCOUNTER — Other Ambulatory Visit: Payer: Self-pay | Admitting: *Deleted

## 2013-07-31 DIAGNOSIS — R918 Other nonspecific abnormal finding of lung field: Secondary | ICD-10-CM

## 2013-08-18 ENCOUNTER — Encounter: Payer: Self-pay | Admitting: Cardiothoracic Surgery

## 2013-08-18 ENCOUNTER — Ambulatory Visit (INDEPENDENT_AMBULATORY_CARE_PROVIDER_SITE_OTHER): Payer: Medicare Other | Admitting: Cardiothoracic Surgery

## 2013-08-18 ENCOUNTER — Ambulatory Visit
Admission: RE | Admit: 2013-08-18 | Discharge: 2013-08-18 | Disposition: A | Discharge status: Home / Self Care | Payer: Medicare Other | Source: Ambulatory Visit | Attending: Cardiothoracic Surgery | Admitting: Cardiothoracic Surgery

## 2013-08-18 VITALS — BP 143/79 | HR 70 | Resp 20 | Ht 64.0 in | Wt 154.0 lb

## 2013-08-18 DIAGNOSIS — R918 Other nonspecific abnormal finding of lung field: Secondary | ICD-10-CM

## 2013-08-18 DIAGNOSIS — I2510 Atherosclerotic heart disease of native coronary artery without angina pectoris: Secondary | ICD-10-CM

## 2013-08-18 NOTE — Progress Notes (Signed)
[address]       [place] [address]             [phone]         Insiya Schweers [place] [facility] Medical Record #[id number] Date of Birth: 05-26-1935  Referring: [name], MD Primary Care: [name], MD  Chief Complaint:    Chief Complaint  Patient presents with  . Follow-up    6 month f/u with Chest CT-low dose    History of Present Illness:    She had a previous biopsy of the left upper lobe lesion that was showed though some reactive changes and atypical cyst and some questionable atypical cells. Followup low-dose CT scan today showed that the lesions had not changed. The left upper lobe lesion was still 16 mm in the right upper lobe lesion was 10 mm. Her mediastinoscopy was negative and it showed granulomatous disease. The patient was seen by Dr [name] in May 2013 and was  not interested in any further biopsies or removal at the present time. Dr [name]  did explain to her that if  these lesions get bigger than resection will need to be done.  She retuns today with follow CT scan.  No anginal type pains, even with daily exercise denies angina. She denies cough hemoptysis or recurrent pulmonary infections. She stay off cigarettes since 2007    Current Activity/ Functional Status: Patient is independent with mobility/ambulation, transfers, ADL's, IADL's.   Past Medical History  Diagnosis Date  . Hyperlipidemia   . CAD (coronary artery disease) 2007    PCI of RCA, bare metal stent.....Dr. Radford Pax  . Thyroid disease     nodules,stable on u/s 2/12  . Baker's cyst     L KNEE  . Vitamin D deficiency   . Disc disorder of lumbar region     2 BULDGING DISCS DX 1990 BY DR. DALLDORF  . No pertinent past medical history     lung mass  occas cough   . Dizzy   . Hypertension     Takes Novasc ,Clonidine, & Losartan  . Cancer     lung mass    Past Surgical History  Procedure Laterality Date  . Carpal tunnel release  06/26/08         04/20/09    RIGHT HAND          LEFT HAND  . Finger contracture release  8/12    Dr. Amedeo Plenty    right hand trigger finger release  . Eye surgery  01/07/09    LEFT CATARACT  . Eye surgery  01/21/09    RIGHT CATARACT  . Coronary angioplasty      dr h Tamala Julian [phone] with one stent  . Mediastinoscopy  08/02/2011    Procedure: MEDIASTINOSCOPY;  Surgeon: Nicanor Alcon, MD;  Location: [facility] OR;  Service: Thoracic;  Laterality: N/A;    Family History  Problem Relation Age of Onset  . Anesthesia problems Neg Hx   . Hypotension Neg Hx   . Malignant hyperthermia Neg Hx   . Pseudochol deficiency Neg Hx     History   Social History  . Marital Status: Widowed    Spouse Name: N/A    Number of Children: 2  . Years of Education: N/A   Occupational History  . Retired    Social History Main Topics  . Smoking status: Former Smoker -- 0.50 packs/day for 10 years    Types: Cigarettes  Quit date: 07/12/2005  . Smokeless tobacco: Never Used  . Alcohol Use: No  . Drug Use: No  . Sexual Activity: Not on file   Other Topics Concern  . Not on file   Social History Narrative  . No narrative on file    History  Smoking status  . Former Smoker -- 0.50 packs/day for 10 years  . Types: Cigarettes  . Quit date: 07/12/2005  Smokeless tobacco  . Never Used    History  Alcohol Use No     Allergies  Allergen Reactions  . Food     Sausage, hot dogs, salami (rash)  . Lipitor [Atorvastatin Calcium] Other (See Comments)    MYALGIAS  . Pantoprazole Sodium Rash    Current Outpatient Prescriptions  Medication Sig Dispense Refill  . amLODipine (NORVASC) 10 MG tablet Take 10 mg by mouth every evening.       Marland Kitchen aspirin 81 MG tablet Take 81 mg by mouth daily.      . Calcium 1500 MG tablet Take 1,500 mg by mouth daily.      . cloNIDine (CATAPRES) 0.1 MG tablet Take 0.1 mg by mouth 2 (two) times daily.        Marland Kitchen losartan-hydrochlorothiazide (HYZAAR) 100-12.5 MG per tablet Take 1 tablet by mouth  daily.        . Omega-3 Fatty Acids (FISH OIL PO) Take 1 capsule by mouth daily.      Marland Kitchen rOPINIRole (REQUIP) 0.25 MG tablet Take 0.25 mg by mouth at bedtime.       Marland Kitchen venlafaxine (EFFEXOR-XR) 37.5 MG 24 hr capsule Take 37.5 mg by mouth daily.       No current facility-administered medications for this visit.       Review of Systems:     Cardiac Review of Systems: Y or N  Chest Pain [  n  ]  Resting SOB [n   ] Exertional SOB  [ n ]  Orthopnea [n  ]   Pedal Edema [ n  ]    Palpitations [ n ] Syncope  [n  ]   Presyncope [ n  ]  General Review of Systems: [Y] = yes [  ]=no Constitional: recent weight change [  ]; anorexia [  ]; fatigue [ n ]; nausea [n  ]; night sweats [ n ]; fever [ n ]; or chills [n  ];                                                                                                                                          Dental: poor dentition[n  ];   Eye : blurred vision [  ]; diplopia [   ]; vision changes [  ];  Amaurosis fugax[  ]; Resp: cough [ y ];  wheezing[ n ];  hemoptysis[n  ]; shortness of breath[n ]; paroxysmal nocturnal dyspnea[ n]; dyspnea  on exertion[  ]; or orthopnea[  ];  GI:  gallstones[  ], vomiting[  ];  dysphagia[  ]; melena[  ];  hematochezia [  ]; heartburn[  ];   Hx of  Colonoscopy[ yes last year ]; GU: kidney stones [  ]; hematuria[  ];   dysuria [  ];  nocturia[  ];  history of     obstruction [  ];             Skin: rash, swelling[  ];, hair loss[  ];  peripheral edema[  ];  or itching[  ]; Musculosketetal: myalgias[  ];  joint swelling[  ];  joint erythema[  ];  joint pain[  ];  back pain[  ];  Heme/Lymph: bruising[  ];  bleeding[  ];  anemia[  ];  Neuro: TIA[  ];  headaches[  ];  stroke[  ];  vertigo[  ];  seizures[  ];   paresthesias[  ];  difficulty walking[  ]; restless legs at night better on medsy  Psych:depression[  ]; anxiety[  ];  Endocrine: diabetes[  ];  thyroid dysfunction[  ];  Immunizations: Flu [ yes ]; Pneumococcal[unknown   ];  Other:  Physical Exam: BP 143/79  Pulse 70  Resp 20  Ht 5\' 4"  (1.626 m)  Wt 154 lb (69.854 kg)  BMI 26.42 kg/m2  SpO2 98%  General appearance: alert, cooperative, appears stated age and no distress Neurologic: intact Heart: regular rate and rhythm, S1, S2 normal, no murmur, click, rub or gallop and normal apical impulse Lungs: clear to auscultation bilaterally and normal percussion bilaterally Abdomen: soft, non-tender; bowel sounds normal; no masses,  no organomegaly Extremities: extremities normal, atraumatic, no cyanosis or edema, Homans sign is negative, no sign of DVT and no edema, redness or tenderness in the calves or thighs no cervicall or axillary adenopathy No carotid bruits  Diagnostic Studies & Laboratory data:     Recent Radiology Findings: Ct Chest Wo Contrast  08/18/2013   CLINICAL DATA:  Followup of pulmonary nodules.  Ex-smoker.  EXAM: CT CHEST WITHOUT CONTRAST  TECHNIQUE: Multidetector CT imaging of the chest was performed following the standard protocol without IV contrast.  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  Mild centrilobular emphysema. Suspicion of either a soft tissue or a ground-glass opacity nodule developing around a branching pulmonary artery. On the order of 5 mm on image 18/series 4 and 6 mm on image 57 coronal. This is not readily apparent comparing back to 1/30 /13.  Favor scarring in the subpleural medial right apex on image 14, unchanged.  A 3 mm right upper lobe lung nodule on image 26/series 4 is similar back to 06/29/2011, consistent with a benign etiology.  Right middle lobe subpleural nodule measures 1.0 x 0.9 cm on image 36 and is similar back to 2013, consistent with a benign etiology.  Stable 3 mm lingular nodule on image 34/series 4. This  was present back to the 2013 exam.  No pleural fluid.  Heart/Mediastinum: Low-density right-sided thyroid nodules which are incompletely characterized but were grossly similar back to 2013. No mediastinal or definite hilar adenopathy, given limitations of unenhanced CT. Aortic and branch vessel atherosclerosis. Normal heart size with multivessel coronary artery atherosclerosis. No pericardial effusion.  Upper Abdomen:  Normal adrenal glands.  Bones/Musculoskeletal: Sclerosis involving the 6 posterior lateral left rib, likely posttraumatic.  IMPRESSION: 1. Similar appearance of right middle lobe, right upper lobe, and lingular nodules since 2013, consistent with a benign etiology. 2. 5-6 mm right upper lobe lung nodule. This is likely new when compared to remote prior exams. Given the smoking history, follow-up chest CT at 6-12 months is recommended. This recommendation follows the consensus statement: "Guidelines for Management of Small Pulmonary Nodules Detected on CT Scans: A Statement from the [place]" as published in Radiology2005;237:395-400. Online at: https://www.arnold.com/. 3.  Atherosclerosis, including within the coronary arteries.   Electronically Signed   By: Abigail Miyamoto M.D.   On: 08/18/2013 16:54     Ct Chest Low Dose Pilot W/o Cm  08/15/2012  *RADIOLOGY REPORT*  Clinical Data: History of smoking.  Pulmonary nodules noted on prior chest CT 02/15/2012.  Follow-up examination.  CT CHEST LOW DOSE PILOT WITHOUT CONTRAST  Technique: Multidetector CT imaging of the chest using the standard low-dose protocol without administration of intravenous contrast.  Comparison: Chest CT 02/15/2012.  Findings:  Mediastinum: Heart size is normal. Small amount of pericardial fluid and/or thickening is unlikely to be of any hemodynamic significance of this time.  No associated pericardial calcification. There is atherosclerosis of the thoracic aorta, the great vessels of the  mediastinum and the coronary arteries, including calcified atherosclerotic plaque in the left main, left anterior descending, left circumflex and right coronary arteries. Calcifications of the mitral annulus. No pathologically enlarged mediastinal or hilar lymph nodes. Please note that accurate exclusion of hilar adenopathy is limited on noncontrast CT scans. Esophagus is unremarkable in appearance.  Lungs/Pleura: Previously described 4 mm nodule in the left upper lobe is unchanged (image 122 of series 3) measuring only 4 mm on today's examination.  Previously noted right middle lobe nodule abutting the minor fissure is unchanged measuring 10 x 9 mm (image 138 of series 3).  No other definite new or enlarging suspicious appearing pulmonary nodules or masses are otherwise noted. Bilateral apical nodular areas of pleuroparenchymal thickening are unchanged compared to prior examinations and most compatible with areas of chronic scarring.  No acute consolidative airspace disease.  No pleural effusions.  There is a background of mild diffuse bronchial wall thickening with mild centrilobular and paraseptal emphysema.  Upper Abdomen: Unremarkable.  Musculoskeletal: There are no aggressive appearing lytic or blastic lesions noted in the visualized portions of the skeleton.  IMPRESSION: 1.  No change in previously noted 10 x 9 mm right middle lobe nodule or 4 mm nodule in the lingula when compared to remote prior examination from 06/29/2011.  Based on the behavior of these nodules, these are favored to be benign, however, continued attention on follow-up studies is recommended. At this time, a 1 year follow-up chest CT in March 2015 is recommended. 2.  Atherosclerosis, including left main and three-vessel coronary artery disease.  Assessment for potential risk factor modification, dietary therapy or pharmacologic therapy may be warranted, if clinically indicated.  3.  Mild diffuse bronchial wall thickening with mild  centrilobular paraseptal emphysema.   Original Report Authenticated By: Vinnie Langton, M.D.       Recent Lab Findings: Lab Results  Component Value Date   WBC 5.5 08/01/2011   HGB 13.7 08/01/2011   HCT 39.4 08/01/2011   PLT 172 08/01/2011   GLUCOSE 97 08/01/2011   ALT 16 08/01/2011   AST 16 08/01/2011   NA 140 08/01/2011   K 3.5 08/01/2011   CL 103  08/01/2011   CREATININE 0.60 08/01/2011   BUN 13 08/01/2011   CO2 29 08/01/2011   INR 0.98 08/01/2011      Assessment / Plan:   Similar appearance of right middle lobe, right upper lobe, and lingular nodules since 2013, consistent with a benign etiology. 5-6 mm right upper lobe lung nodule. This may be new compared to other studies  Given the smoking history, follow-up chest CT at 6 months Atherosclerosis, including left main and three-vessel coronary artery disease, incidental noted on CT.  Known CAD with previous stent RCA, Followed by Dr Radford Pax Will plan to see back in 6 months with repeat super D CT of chest    Grace Isaac MD  Beeper [phone] Office [phone] 08/18/2013 5:27 PM

## 2013-12-01 ENCOUNTER — Ambulatory Visit: Payer: Medicare Other | Admitting: Cardiology

## 2014-01-09 ENCOUNTER — Other Ambulatory Visit: Payer: Self-pay | Admitting: *Deleted

## 2014-01-09 DIAGNOSIS — R918 Other nonspecific abnormal finding of lung field: Secondary | ICD-10-CM

## 2014-02-11 ENCOUNTER — Ambulatory Visit (INDEPENDENT_AMBULATORY_CARE_PROVIDER_SITE_OTHER): Payer: Medicare Other | Admitting: Cardiology

## 2014-02-11 ENCOUNTER — Encounter: Payer: Self-pay | Admitting: Cardiology

## 2014-02-11 VITALS — BP 150/80 | HR 50 | Ht 64.0 in | Wt 148.8 lb

## 2014-02-11 DIAGNOSIS — E785 Hyperlipidemia, unspecified: Secondary | ICD-10-CM

## 2014-02-11 DIAGNOSIS — I2510 Atherosclerotic heart disease of native coronary artery without angina pectoris: Secondary | ICD-10-CM

## 2014-02-11 DIAGNOSIS — I10 Essential (primary) hypertension: Secondary | ICD-10-CM

## 2014-02-11 DIAGNOSIS — G2581 Restless legs syndrome: Secondary | ICD-10-CM

## 2014-02-11 DIAGNOSIS — R079 Chest pain, unspecified: Secondary | ICD-10-CM | POA: Insufficient documentation

## 2014-02-11 MED ORDER — ROPINIROLE HCL 0.5 MG PO TABS: 0.5000 mg | ORAL_TABLET | Freq: Every day | ORAL | Status: DC

## 2014-02-11 NOTE — Addendum Note (Signed)
Addended byUlla Potash H on: 02/11/2014 04:18 PM   Modules accepted: Orders

## 2014-02-11 NOTE — Addendum Note (Signed)
Addended by: Sueanne Margarita on: 02/11/2014 07:36 PM   Modules accepted: Orders

## 2014-02-11 NOTE — Progress Notes (Addendum)
[place] [place], [place]  [address] Phone: [phone] Fax:  [phone]  Date:  02/11/2014  ID:  Charmelle Soh, DOB [birth year], MRN [id number]  PCP:  [name], MD  Cardiologist:  Fransico Him, MD     History of Present Illness: Margaret Hale is a 79 y.o. female with a history of ASCAD s/p PCI of RCA with BMS 2007, HTN and dyslipidemia who presents today for evaluation of CP  I have not seen her since 2012.  She says that since March she has been having intermittent jaw tightness. Then in April she started having palpitations and her BP machine recorded HR of 135bpm.  She took some of her daughter in laws medication which was alprazolam and it resolved. The jaw pain has occurred at least once monthly since then.  In July she started having pain in her midsternal region of her heart with radiation into her axilla.  This is exertional and nonexertional.  The last episode was last weekend and awakened her at 5am.  She took 3 ASA and 30 minutes it resolved. She denied any diaphoresis ,nausea or SOB with the discomfort.   She denies any SOB, DOE, LE edema or syncope   Wt Readings from Last 3 Encounters:  02/11/14 148 lb 12.8 oz (67.495 kg)  08/18/13 154 lb (69.854 kg)  08/15/12 154 lb (69.854 kg)     Past Medical History  Diagnosis Date  . Hyperlipidemia   . CAD (coronary artery disease) 2007    PCI of RCA, bare metal stent.....Dr. Radford Pax  . Thyroid disease     nodules,stable on u/s 2/12  . Baker's cyst     L KNEE  . Vitamin D deficiency   . Disc disorder of lumbar region     2 BULDGING DISCS DX 1990 BY DR. DALLDORF  . No pertinent past medical history     lung mass  occas cough   . Dizzy   . Hypertension     Takes Novasc ,Clonidine, & Losartan  . Cancer     lung mass    Current Outpatient Prescriptions  Medication Sig Dispense Refill  . amLODipine (NORVASC) 10 MG tablet Take 10 mg by mouth every evening.       Marland Kitchen aspirin 81 MG tablet Take 81  mg by mouth daily.      . Calcium 1500 MG tablet Take 1,500 mg by mouth daily.      . cloNIDine (CATAPRES) 0.1 MG tablet Take 0.1 mg by mouth 2 (two) times daily.        Marland Kitchen losartan (COZAAR) 100 MG tablet Take 100 mg by mouth daily.      . Omega-3 Fatty Acids (FISH OIL PO) Take 1 capsule by mouth daily.      Marland Kitchen rOPINIRole (REQUIP) 0.5 MG tablet Take 1 tablet (0.5 mg total) by mouth at bedtime.  30 tablet  6  . triamcinolone cream (KENALOG) 0.1 %       . venlafaxine (EFFEXOR-XR) 37.5 MG 24 hr capsule Take 37.5 mg by mouth daily.       No current facility-administered medications for this visit.    Allergies:    Allergies  Allergen Reactions  . Food     Sausage, hot dogs, salami (rash)  . Lipitor [Atorvastatin Calcium] Other (See Comments)    MYALGIAS  . Pantoprazole Sodium Rash    Social History:  The patient  reports that she quit smoking about 8 years ago. Her smoking  use included Cigarettes. She has a 5 pack-year smoking history. She has never used smokeless tobacco. She reports that she does not drink alcohol or use illicit drugs.   Family History:  The patient's family history is negative for Anesthesia problems, Hypotension, Malignant hyperthermia, and Pseudochol deficiency.   ROS:  Please see the history of present illness.      All other systems reviewed and negative.   PHYSICAL EXAM: VS:  BP 150/80  Pulse 50  Ht 5\' 4"  (1.626 m)  Wt 148 lb 12.8 oz (67.495 kg)  BMI 25.53 kg/m2 Well nourished, well developed, in no acute distress HEENT: normal Neck: no JVD Cardiac:  normal S1, S2; RRR; no murmur Lungs:  clear to auscultation bilaterally, no wheezing, rhonchi or rales Abd: soft, nontender, no hepatomegaly Ext: no edema Skin: warm and dry Neuro:  CNs 2-12 intact, no focal abnormalities noted  EKG:     Sinus bradycardia at 50bpm with no ST changes  ASSESSMENT AND PLAN:  1. ASCAD s/p PCI of RCA 2007 - now with episodes of intermittent jaw pain and chest pain - continue  ASA - lexiscan myoview to rule out ischemia 2. HTN - BP borderline elevated -continue amlodipine/clonidine/Hyzaar 3. Dyslipidemia - intolerant to lipitor and simastatin in the past.  Her last LDL 09/2013 was not at goal of <70 (107) - will get into lipid clinic for guidance on other options 4.  Restless legs syndrome - she has been on Requip for years but now the dose is not working.  I have instructed her to increase Requip to 0.5mg  qhs PRN   Followup with me in 6 month if stress test is ok  Signed, Fransico Him, MD 02/11/2014 7:35 PM

## 2014-02-11 NOTE — Patient Instructions (Addendum)
Your physician has requested that you have a lexiscan myoview. For further information please visit [email or web address]. Please follow instruction sheet, as given.  You have been referred to Lipid Clinic here in our office.   Your physician has recommended you make the following change in your medication:   1. Increase Requip to 0.5 mg 1 tablet daily. Refill sent to pharmacy  Your physician wants you to follow-up in: 6 months with Dr. Radford Pax. You will receive a reminder letter in the mail two months in advance. If you don't receive a letter, please call our office to schedule the follow-up appointment.

## 2014-02-12 ENCOUNTER — Ambulatory Visit (INDEPENDENT_AMBULATORY_CARE_PROVIDER_SITE_OTHER): Payer: Medicare Other | Admitting: Cardiothoracic Surgery

## 2014-02-12 ENCOUNTER — Ambulatory Visit
Admission: RE | Admit: 2014-02-12 | Discharge: 2014-02-12 | Disposition: A | Discharge status: Home / Self Care | Payer: Medicare Other | Source: Ambulatory Visit | Attending: Cardiothoracic Surgery | Admitting: Cardiothoracic Surgery

## 2014-02-12 ENCOUNTER — Encounter: Payer: Self-pay | Admitting: Cardiothoracic Surgery

## 2014-02-12 VITALS — BP 162/76 | HR 55 | Ht 64.0 in | Wt 148.0 lb

## 2014-02-12 DIAGNOSIS — R918 Other nonspecific abnormal finding of lung field: Secondary | ICD-10-CM

## 2014-02-12 NOTE — Progress Notes (Signed)
[address]       [place] [address]             [phone]         Margaret Hale Medical Record #[id number] Date of Birth: 07-07-1934  Referring: [name], MD Primary Care: [name], MD  Chief Complaint:    No chief complaint on file.   History of Present Illness:    She had a previous biopsy of the left upper lobe lesion that was showed though some reactive changes and atypical cyst and some questionable atypical cells. Followup low-dose CT scan today showed that the lesions had not changed. The left upper lobe lesion was still 16 mm in the right upper lobe lesion was 10 mm. Her mediastinoscopy was negative and it showed granulomatous disease. The patient was seen by Dr [name] in May 2013 and was  not interested in any further biopsies or removal at the present time. Dr [name]  did explain to her that if  these lesions get bigger than resection will need to be done.  She retuns today with follow CT scan.  No anginal type pains, even with daily exercise denies angina. She denies cough hemoptysis or recurrent pulmonary infections. She stay off cigarettes since 2007 She does note some early morning sputum production.   Current Activity/ Functional Status: Patient is independent with mobility/ambulation, transfers, ADL's, IADL's.   Past Medical History  Diagnosis Date  . Hyperlipidemia   . CAD (coronary artery disease) 2007    PCI of RCA, bare metal stent.....Dr. Radford Pax  . Thyroid disease     nodules,stable on u/s 2/12  . Baker's cyst     L KNEE  . Vitamin D deficiency   . Disc disorder of lumbar region     2 BULDGING DISCS DX 1990 BY DR. DALLDORF  . No pertinent past medical history     lung mass  occas cough   . Dizzy   . Hypertension     Takes Novasc ,Clonidine, & Losartan  . Cancer     lung mass    Past Surgical History  Procedure Laterality Date  . Carpal tunnel release  06/26/08        04/20/09    RIGHT  HAND          LEFT HAND  . Finger contracture release  8/12    Dr. Amedeo Plenty    right hand trigger finger release  . Eye surgery  01/07/09    LEFT CATARACT  . Eye surgery  01/21/09    RIGHT CATARACT  . Coronary angioplasty      dr h Tamala Julian [phone] with one stent  . Mediastinoscopy  08/02/2011    Procedure: MEDIASTINOSCOPY;  Surgeon: Nicanor Alcon, MD;  Location: [facility] OR;  Service: Thoracic;  Laterality: N/A;    Family History  Problem Relation Age of Onset  . Anesthesia problems Neg Hx   . Hypotension Neg Hx   . Malignant hyperthermia Neg Hx   . Pseudochol deficiency Neg Hx     History   Social History  . Marital Status: Widowed    Spouse Name: N/A    Number of Children: 2  . Years of Education: N/A   Occupational History  . Retired    Social History Main Topics  . Smoking status: Former Smoker -- 0.50 packs/day for 10 years    Types: Cigarettes    Quit date: 07/12/2005  . Smokeless  tobacco: Never Used  . Alcohol Use: No  . Drug Use: No  . Sexual Activity: Not on file   Other Topics Concern  . Not on file   Social History Narrative  . No narrative on file    History  Smoking status  . Former Smoker -- 0.50 packs/day for 10 years  . Types: Cigarettes  . Quit date: 07/12/2005  Smokeless tobacco  . Never Used    History  Alcohol Use No     Allergies  Allergen Reactions  . Food     Sausage, hot dogs, salami (rash)  . Lipitor [Atorvastatin Calcium] Other (See Comments)    MYALGIAS  . Pantoprazole Sodium Rash    Current Outpatient Prescriptions  Medication Sig Dispense Refill  . amLODipine (NORVASC) 10 MG tablet Take 10 mg by mouth every evening.       Marland Kitchen aspirin 81 MG tablet Take 81 mg by mouth daily.      . Calcium 1500 MG tablet Take 1,500 mg by mouth daily.      . cloNIDine (CATAPRES) 0.1 MG tablet Take 0.1 mg by mouth 2 (two) times daily.        Marland Kitchen losartan (COZAAR) 100 MG tablet Take 100 mg by mouth daily.      . Omega-3 Fatty Acids (FISH OIL PO)  Take 1 capsule by mouth daily.      Marland Kitchen rOPINIRole (REQUIP) 0.5 MG tablet Take 1 tablet (0.5 mg total) by mouth at bedtime.  30 tablet  6  . triamcinolone cream (KENALOG) 0.1 %       . venlafaxine (EFFEXOR-XR) 37.5 MG 24 hr capsule Take 37.5 mg by mouth daily.       No current facility-administered medications for this visit.       Review of Systems:     Cardiac Review of Systems: Y or N  Chest Pain [  n  ]  Resting SOB [n   ] Exertional SOB  [ n ]  Orthopnea [n  ]   Pedal Edema [ n  ]    Palpitations [ n ] Syncope  [n  ]   Presyncope [ n  ]  General Review of Systems: [Y] = yes [  ]=no Constitional: recent weight change [  ]; anorexia [  ]; fatigue [ n ]; nausea [n  ]; night sweats [ n ]; fever [ n ]; or chills [n  ];                                                                                                                                          Dental: poor dentition[n  ];   Eye : blurred vision [  ]; diplopia [   ]; vision changes [  ];  Amaurosis fugax[  ]; Resp: cough [ y ];  wheezing[ n ];  hemoptysis[n  ]; shortness of breath[n ];  paroxysmal nocturnal dyspnea[ n]; dyspnea on exertion[  ]; or orthopnea[  ];  GI:  gallstones[  ], vomiting[  ];  dysphagia[  ]; melena[  ];  hematochezia [  ]; heartburn[  ];   Hx of  Colonoscopy[ yes last year ]; GU: kidney stones [  ]; hematuria[  ];   dysuria [  ];  nocturia[  ];  history of     obstruction [  ];             Skin: rash, swelling[  ];, hair loss[  ];  peripheral edema[  ];  or itching[  ]; Musculosketetal: myalgias[  ];  joint swelling[  ];  joint erythema[  ];  joint pain[  ];  back pain[  ];  Heme/Lymph: bruising[  ];  bleeding[  ];  anemia[  ];  Neuro: TIA[  ];  headaches[  ];  stroke[  ];  vertigo[  ];  seizures[  ];   paresthesias[  ];  difficulty walking[  ]; restless legs at night better on medsy  Psych:depression[  ]; anxiety[  ];  Endocrine: diabetes[  ];  thyroid dysfunction[  ];  Immunizations: Flu [ yes ];  Pneumococcal[unknown  ];  Other:  Physical Exam: There were no vitals taken for this visit.  General appearance: alert, cooperative, appears stated age and no distress Neurologic: intact Heart: regular rate and rhythm, S1, S2 normal, no murmur, click, rub or gallop and normal apical impulse Lungs: clear to auscultation bilaterally and normal percussion bilaterally Abdomen: soft, non-tender; bowel sounds normal; no masses,  no organomegaly Extremities: extremities normal, atraumatic, no cyanosis or edema, Homans sign is negative, no sign of DVT and no edema, redness or tenderness in the calves or thighs no cervicall or axillary adenopathy No carotid bruits  Diagnostic Studies & Laboratory data:     Recent Radiology Findings: Ct Chest Wo Contrast  02/12/2014   CLINICAL DATA:  Followup right upper lobe pulmonary nodule. Other nodules have been stable for more than 2 years, compatible with a benign etiology.  EXAM: CT CHEST WITHOUT CONTRAST  TECHNIQUE: Multidetector CT imaging of the chest was performed following the standard protocol without IV contrast.  COMPARISON:  08/18/2013  FINDINGS: Biapical pleural thickening noted. Tracheal diverticulum in the 7 o'clock position image 12 is reidentified. Areas of subpleural scarring are stable. 5 mm right upper lobe pulmonary nodule image 18 is stable. Other pulmonary nodules identified elsewhere are stable. Intrapulmonary lymph node abutting the minor fissure image 34 is stable. Left lower lobe calcified granuloma image 38 is stable.  Multilevel disc degenerative change noted. No compression deformity.  Thyroid nodularity is stable. Stable small pretracheal nodes. Assessment for central lymphadenopathy is suboptimal due to lack of contrast. Stable 1 cm dominant pretracheal node image 26. Moderate atheromatous aortic calcification identified. Heart size is normal. No pleural effusion or pericardial effusion. Aortic ectasia at the level of the distal arch is  stable measuring 3.5 cm image 21.  IMPRESSION: Stable right upper lobe pulmonary nodule for which followup was previously recommended due to nonvisualization on previous studies. Other previously evaluated nodules elsewhere are stable and do not require specific follow-up. Followup chest CT is recommended March 2016.   Electronically Signed   By: Conchita Paris M.D.   On: 02/12/2014 14:21  Ct Chest Wo Contrast  08/18/2013   CLINICAL DATA:  Followup of pulmonary nodules.  Ex-smoker.  EXAM: CT CHEST WITHOUT CONTRAST  TECHNIQUE: Multidetector CT imaging of the chest was  performed following the standard protocol without IV contrast.  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  Mild centrilobular emphysema. Suspicion of either a soft tissue or a ground-glass opacity nodule developing around a branching pulmonary artery. On the order of 5 mm on image 18/series 4 and 6 mm on image 57 coronal. This is not readily apparent comparing back to 1/30 /13.  Favor scarring in the subpleural medial right apex on image 14, unchanged.  A 3 mm right upper lobe lung nodule on image 26/series 4 is similar back to 06/29/2011, consistent with a benign etiology.  Right middle lobe subpleural nodule measures 1.0 x 0.9 cm on image 36 and is similar back to 2013, consistent with a benign etiology.  Stable 3 mm lingular nodule on image 34/series 4. This was present back to the 2013 exam.  No pleural fluid.  Heart/Mediastinum: Low-density right-sided thyroid nodules which are incompletely characterized but were grossly similar back to 2013. No mediastinal or definite hilar adenopathy, given limitations of unenhanced CT. Aortic and branch vessel atherosclerosis. Normal heart size with multivessel coronary artery atherosclerosis. No pericardial  effusion.  Upper Abdomen:  Normal adrenal glands.  Bones/Musculoskeletal: Sclerosis involving the 6 posterior lateral left rib, likely posttraumatic.  IMPRESSION: 1. Similar appearance of right middle lobe, right upper lobe, and lingular nodules since 2013, consistent with a benign etiology. 2. 5-6 mm right upper lobe lung nodule. This is likely new when compared to remote prior exams. Given the smoking history, follow-up chest CT at 6-12 months is recommended. This recommendation follows the consensus statement: "Guidelines for Management of Small Pulmonary Nodules Detected on CT Scans: A Statement from the [place]" as published in Radiology2005;237:395-400. Online at: https://www.arnold.com/. 3.  Atherosclerosis, including within the coronary arteries.   Electronically Signed   By: Abigail Miyamoto M.D.   On: 08/18/2013 16:54     Ct Chest Low Dose Pilot W/o Cm  08/15/2012  *RADIOLOGY REPORT*  Clinical Data: History of smoking.  Pulmonary nodules noted on prior chest CT 02/15/2012.  Follow-up examination.  CT CHEST LOW DOSE PILOT WITHOUT CONTRAST  Technique: Multidetector CT imaging of the chest using the standard low-dose protocol without administration of intravenous contrast.  Comparison: Chest CT 02/15/2012.  Findings:  Mediastinum: Heart size is normal. Small amount of pericardial fluid and/or thickening is unlikely to be of any hemodynamic significance of this time.  No associated pericardial calcification. There is atherosclerosis of the thoracic aorta, the great vessels of the mediastinum and the coronary arteries, including calcified atherosclerotic plaque in the left main, left anterior descending, left circumflex and right coronary arteries. Calcifications of the mitral annulus. No pathologically enlarged mediastinal or hilar lymph nodes. Please note that accurate exclusion of hilar adenopathy is limited on noncontrast CT scans. Esophagus is unremarkable in  appearance.  Lungs/Pleura: Previously described 4 mm nodule in the left upper lobe is unchanged (image 122 of series 3) measuring only 4 mm on today's examination.  Previously noted right middle lobe nodule abutting the minor fissure is unchanged measuring 10 x 9 mm (image 138 of series 3).  No other definite new or enlarging suspicious appearing pulmonary nodules or masses are otherwise noted. Bilateral apical nodular areas of pleuroparenchymal thickening are unchanged compared to prior examinations and most compatible with areas of chronic scarring.  No acute consolidative airspace disease.  No pleural effusions.  There is a background of mild diffuse bronchial wall thickening with mild centrilobular and paraseptal emphysema.  Upper Abdomen: Unremarkable.  Musculoskeletal: There are no aggressive appearing lytic or blastic lesions noted in the visualized portions of the skeleton.  IMPRESSION: 1.  No change in previously noted 10 x 9 mm right middle lobe nodule or 4 mm nodule in the lingula when compared to remote prior examination from 06/29/2011.  Based on the behavior of these nodules, these are favored to be benign, however, continued attention on follow-up studies is recommended. At this time, a 1 year follow-up chest CT in March 2015 is recommended. 2.  Atherosclerosis, including left main and three-vessel coronary artery disease.  Assessment for potential risk factor modification, dietary therapy or pharmacologic therapy may be warranted, if clinically indicated.  3.  Mild diffuse bronchial wall thickening with mild centrilobular paraseptal emphysema.   Original Report Authenticated By: Vinnie Langton, M.D.       Recent Lab Findings: Lab Results  Component Value Date   WBC 5.5 08/01/2011   HGB 13.7 08/01/2011   HCT 39.4 08/01/2011   PLT 172 08/01/2011   GLUCOSE 97 08/01/2011   ALT 16 08/01/2011   AST 16 08/01/2011   NA 140 08/01/2011   K 3.5 08/01/2011   CL 103 08/01/2011   CREATININE 0.60 08/01/2011   BUN 13  08/01/2011   CO2 29 08/01/2011   INR 0.98 08/01/2011      Assessment / Plan:   Similar appearance of right middle lobe, right upper lobe, and lingular nodules since 2013, consistent with a benign etiology. Atherosclerosis, including left main and three-vessel coronary artery disease, incidental noted on CT.  Known CAD with previous stent RCA, Followed by Dr Radford Pax she is to have stress test by Dr. Radford Pax in the near future Will plan to see back in 12 months with repeat  CT of chest    Grace Isaac MD  [facility] [phone] 02/12/2014 2:51 PM

## 2014-02-24 ENCOUNTER — Encounter: Payer: Self-pay | Admitting: Pharmacist Clinician (PhC)/ Clinical Pharmacy Specialist

## 2014-02-24 ENCOUNTER — Ambulatory Visit (HOSPITAL_COMMUNITY): Payer: Medicare Other | Attending: Family Medicine | Admitting: Radiology

## 2014-02-24 ENCOUNTER — Other Ambulatory Visit (INDEPENDENT_AMBULATORY_CARE_PROVIDER_SITE_OTHER): Payer: Medicare Other | Admitting: *Deleted

## 2014-02-24 ENCOUNTER — Other Ambulatory Visit: Payer: Self-pay

## 2014-02-24 ENCOUNTER — Ambulatory Visit (INDEPENDENT_AMBULATORY_CARE_PROVIDER_SITE_OTHER): Payer: Medicare Other | Admitting: Pharmacist Clinician (PhC)/ Clinical Pharmacy Specialist

## 2014-02-24 VITALS — BP 141/69 | HR 49 | Ht 64.0 in | Wt 149.0 lb

## 2014-02-24 DIAGNOSIS — R079 Chest pain, unspecified: Secondary | ICD-10-CM | POA: Diagnosis not present

## 2014-02-24 DIAGNOSIS — R002 Palpitations: Secondary | ICD-10-CM | POA: Insufficient documentation

## 2014-02-24 DIAGNOSIS — E785 Hyperlipidemia, unspecified: Secondary | ICD-10-CM

## 2014-02-24 DIAGNOSIS — I10 Essential (primary) hypertension: Secondary | ICD-10-CM | POA: Insufficient documentation

## 2014-02-24 DIAGNOSIS — R42 Dizziness and giddiness: Secondary | ICD-10-CM | POA: Diagnosis not present

## 2014-02-24 DIAGNOSIS — I2510 Atherosclerotic heart disease of native coronary artery without angina pectoris: Secondary | ICD-10-CM

## 2014-02-24 LAB — LIPID PANEL
CHOLESTEROL: 222 mg/dL — AB (ref 0–200)
HDL: 68 mg/dL (ref >39.00)
LDL Cholesterol: 140 mg/dL — ABNORMAL HIGH (ref 0–99)
NonHDL: 154
Total CHOL/HDL Ratio: 3
Triglycerides: 71 mg/dL (ref 0.0–149.0)
VLDL: 14.2 mg/dL (ref 0.0–40.0)

## 2014-02-24 LAB — HEPATIC FUNCTION PANEL
ALT: 25 U/L (ref 0–35)
AST: 23 U/L (ref 0–37)
Albumin: 4.2 g/dL (ref 3.5–5.2)
Alkaline Phosphatase: 90 U/L (ref 39–117)
BILIRUBIN DIRECT: 0.2 mg/dL (ref 0.0–0.3)
BILIRUBIN TOTAL: 0.8 mg/dL (ref 0.2–1.2)
Total Protein: 7.4 g/dL (ref 6.0–8.3)

## 2014-02-24 MED ORDER — REGADENOSON 0.4 MG/5ML IV SOLN
0.4000 mg | Freq: Once | INTRAVENOUS | Status: AC
  Administered 2014-02-24: 0.4 mg via INTRAVENOUS

## 2014-02-24 MED ORDER — TECHNETIUM TC 99M SESTAMIBI GENERIC - CARDIOLITE
33.0000 | Freq: Once | INTRAVENOUS | Status: AC | PRN
  Administered 2014-02-24: 33 via INTRAVENOUS

## 2014-02-24 MED ORDER — TECHNETIUM TC 99M SESTAMIBI GENERIC - CARDIOLITE
11.0000 | Freq: Once | INTRAVENOUS | Status: AC | PRN
  Administered 2014-02-24: 11 via INTRAVENOUS

## 2014-02-24 NOTE — Progress Notes (Signed)
02/24/2014 Margaret Hale 10-15-1934 [id number]   HPI:  Margaret Hale is a 79 y.o. female patient of Dr. Radford Pax, with a PMH below who presents today for a lipid clinic evaluation.  Margaret Hale's cardiac history is significant for PCI of RCA in 2007 with a bare metal stent.  She has had irregular follow up since then, but recently saw Dr. Radford Pax for midsternal and jaw pain.  She is having a Lexicographer today.   She denies any family history of cardiac disease, but states that her mother died young and she doesn't know about her father.  States that her 2 sons are healthy and free of heart disease.    Her last lipid profile was in May of this year, showing a direct LDL of 107, non-HDL of 132 and HDL of 68.  She has been intolerant to both atorvastatin and simvastatin in the past.  She is currently not taking any lipid lowering medications.  Her diet consists of mostly fresh fruits and vegetables, beans and fish (mackerel and sardines).  She eats very little beef or pork and limits eggs to no more than 3 per week.    She regularly walks to do her shopping, will walk up to 2-3 hours each day.     Current Outpatient Prescriptions  Medication Sig Dispense Refill  . amLODipine (NORVASC) 10 MG tablet Take 10 mg by mouth every evening.       Marland Kitchen aspirin 81 MG tablet Take 81 mg by mouth daily.      . Calcium 1500 MG tablet Take 1,500 mg by mouth daily.      . cloNIDine (CATAPRES) 0.1 MG tablet Take 0.1 mg by mouth 2 (two) times daily.        Marland Kitchen losartan (COZAAR) 100 MG tablet Take 100 mg by mouth daily.      . Omega-3 Fatty Acids (FISH OIL PO) Take 1 capsule by mouth daily.      Marland Kitchen rOPINIRole (REQUIP) 0.5 MG tablet Take 1 tablet (0.5 mg total) by mouth at bedtime.  30 tablet  6  . triamcinolone cream (KENALOG) 0.1 %       . venlafaxine (EFFEXOR-XR) 37.5 MG 24 hr capsule Take 37.5 mg by mouth daily.       No current facility-administered medications for this visit.    Allergies    Allergen Reactions  . Food     Sausage, hot dogs, salami (rash)  . Lipitor [Atorvastatin Calcium] Other (See Comments)    MYALGIAS  . Pantoprazole Sodium Rash    Past Medical History  Diagnosis Date  . Hyperlipidemia   . CAD (coronary artery disease) 2007    PCI of RCA, bare metal stent.....Dr. Radford Pax  . Thyroid disease     nodules,stable on u/s 2/12  . Baker's cyst     L KNEE  . Vitamin D deficiency   . Disc disorder of lumbar region     2 BULDGING DISCS DX 1990 BY DR. DALLDORF  . No pertinent past medical history     lung mass  occas cough   . Dizzy   . Hypertension     Takes Novasc ,Clonidine, & Losartan  . Cancer     lung mass    There were no vitals taken for this visit.   ASSESSMENT AND PLAN:  Her last labs were drawn in May of this year.  I will have her stop at the lab today for a repeat of her lipid  profile.  She is quite resistant to taking any medications, feeling that the best thing for her cholesterol is herbal teas.  I tried to talk about the option of Zetia or even once weekly Crestor, but she is adamant.  I have encouraged her to continue with her current eating habits and exercise and will call her with the results of blood work.    Tommy Medal PharmD CPP [facility]

## 2014-02-24 NOTE — Progress Notes (Signed)
[facility]UCLE[facility] [address] [place], [place] [address] [phone]    Cardiology Nuclear Med Study  Margaret Hale is a 79 y.o. female     MRN : [id number]     DOB: 09/19/1934  Procedure Date: 02/24/2014  Nuclear Med Background Indication for Stress Test:  Evaluation for Ischemia,Abnormal CT and Follow up CAD History:  CAD, MPI 2011 (normal) EF 74%, COPD Cardiac Risk Factors: Hypertension and Lipids  Symptoms:  Chest Pain (last date of chest discomfort was two weeks ago), Dizziness and Palpitations   Nuclear Pre-Procedure Caffeine/Decaff Intake:  None NPO After: 9:00pm   Lungs:  clear O2 Sat: 96% on room air. IV 0.9% NS with Angio Cath:  22g  IV Site: R Wrist  IV Started by:  Matilde Haymaker, RN  Chest Size (in):  46 Cup Size: C  Height: 5\' 4"  (1.626 m)  Weight:  149 lb (67.586 kg)  BMI:  Body mass index is 25.56 kg/(m^2). Tech Comments:  n/a    Nuclear Med Study 1 or 2 day study: 1 day  Stress Test Type:  Lexiscan  Reading MD: n/a  Order Authorizing Provider:  Tressia Miners Turner,MD  Resting Radionuclide: Technetium 10m Sestamibi  Resting Radionuclide Dose: 11.0 mCi   Stress Radionuclide:  Technetium 1m Sestamibi  Stress Radionuclide Dose: 33.0 mCi           Stress Protocol Rest HR: 49 Stress HR: 62  Rest BP: 141/69 Stress BP: 118/59  Exercise Time (min): n/a METS: n/a           Dose of Adenosine (mg):  n/a Dose of Lexiscan: 0.4 mg  Dose of Atropine (mg): n/a Dose of Dobutamine: n/a mcg/kg/min (at max HR)  Stress Test Technologist: Glade Lloyd, BS-ES  Nuclear Technologist:  Earl Many, CNMT     Rest Procedure:  Myocardial perfusion imaging was performed at rest 45 minutes following the intravenous administration of Technetium 61m Sestamibi. Rest ECG: NSR - Normal EKG  Stress Procedure:  The patient received IV Lexiscan 0.4 mg over 15-seconds.  Technetium 75m Sestamibi injected at 30-seconds.  Quantitative spect images were  obtained after a 45 minute delay.  During the infusion of Lexiscan the patient complained of SOB, dizziness, fatigue, neck and throat tightness.  These symptom began to resolve in recovery.  Stress ECG: There are scattered PVCs.  QPS Raw Data Images:  Mild diaphragmatic attenuation.  Normal left ventricular size. Stress Images:  Apical inferior defect Rest Images:  Apical inferior defect Subtraction (SDS):  There is a fixed inferior defect that is most consistent with diaphragmatic attenuation. Transient Ischemic Dilatation (Normal <1.22):  1.06 Lung/Heart Ratio (Normal <0.45):  0.32  Quantitative Gated Spect Images QGS EDV:  92 ml QGS ESV:  30 ml  Impression Exercise Capacity:  Lexiscan with no exercise. BP Response:  Hypotensive blood pressure response. Clinical Symptoms:  No significant symptoms noted. ECG Impression:  No significant ECG changes with Lexiscan. Comparison with Prior Nuclear Study: No significant change compared to prior study in 2011  Overall Impression:  Low risk stress nuclear study with inferoapical attenuation artifact. No significant reversibility..  LV Ejection Fraction: 68%.  LV Wall Motion:  Normal Wall Motion  Pixie Casino, MD, [facility] Board Certified in Nuclear Cardiology Attending Cardiologist [place]

## 2014-02-24 NOTE — Patient Instructions (Signed)
Continue with health diet and current exercise.  We will draw cholesterol levels today and send you a copy of the results when they are available.

## 2014-02-26 ENCOUNTER — Telehealth: Payer: Self-pay | Admitting: Pharmacist Clinician (PhC)/ Clinical Pharmacy Specialist

## 2014-02-26 MED ORDER — EZETIMIBE 10 MG PO TABS
10.0000 mg | ORAL_TABLET | Freq: Every day | ORAL | Status: DC
Start: 2014-02-26 — End: 2014-02-26

## 2014-02-26 MED ORDER — EZETIMIBE 10 MG PO TABS: 10.0000 mg | ORAL_TABLET | Freq: Every day | ORAL | Status: DC

## 2014-02-26 NOTE — Telephone Encounter (Signed)
Spoke with patient about increase in LDL from last labs in May.  Pt willing to try Zetia x 3 months and repeat lipid profile.  Rx sent to [facility], will arrange for repeat visit in 3 months

## 2014-03-02 ENCOUNTER — Telehealth: Payer: Self-pay | Admitting: Cardiology

## 2014-03-02 DIAGNOSIS — R002 Palpitations: Secondary | ICD-10-CM

## 2014-03-02 NOTE — Telephone Encounter (Signed)
Please have her wear a 30 day heart monitor to assess for arrhythmias

## 2014-03-02 NOTE — Telephone Encounter (Signed)
Message copied by Theodoro Parma on Mon Mar 02, 2014  2:00 PM ------      Message from: Fransico Him R      Created: Thu Feb 26, 2014  3:52 PM       I do not think the jaw pain is related to her heart and needs to followup with her PCP ------

## 2014-03-02 NOTE — Telephone Encounter (Signed)
New message  ° ° °Patient calling for test results.   °

## 2014-03-02 NOTE — Telephone Encounter (Signed)
Talked to patient about symptoms. Patient st that on Saturday 10/3, she had a "short period" of chest tightness. On Sunday 10/4 from [place], pt st her heart was pounding so hard she couldn't sleep until it was done at 0600. Patient st she has not had any other symptoms and the jaw pain has subsided.  Instructed patient to F/U with her PCP and to call the office if she has palpitations again so we can do an EKG.  Forwarding to Dr. Radford Pax for review.

## 2014-03-02 NOTE — Telephone Encounter (Signed)
Pt notified. Event monitor ordered. [place] to schedule.

## 2014-03-04 ENCOUNTER — Encounter: Payer: Self-pay | Admitting: *Deleted

## 2014-03-04 ENCOUNTER — Encounter (INDEPENDENT_AMBULATORY_CARE_PROVIDER_SITE_OTHER): Payer: Medicare Other

## 2014-03-04 DIAGNOSIS — R002 Palpitations: Secondary | ICD-10-CM

## 2014-03-04 NOTE — Progress Notes (Signed)
Patient ID: Margaret Hale, female   DOB: 05-24-1935, 79 y.o.   MRN: [id number] [employer] 30 day cardiac event monitor applied to patient.  [employer] contacted to mail patient electrodes for sensitive skin.

## 2014-04-09 ENCOUNTER — Telehealth: Payer: Self-pay | Admitting: Cardiology

## 2014-04-09 NOTE — Telephone Encounter (Signed)
Please let patient know that heart monitor showed NSR and sinus bradycardia.  Average HR was 60bpm

## 2014-04-09 NOTE — Telephone Encounter (Signed)
Informed patient of monitor results. Instructed to call the office if she has any questions.

## 2014-04-12 ENCOUNTER — Encounter: Payer: Self-pay | Admitting: *Deleted

## 2014-06-08 ENCOUNTER — Encounter: Payer: Self-pay | Admitting: Cardiology

## 2014-06-22 ENCOUNTER — Other Ambulatory Visit: Payer: Self-pay

## 2014-06-22 DIAGNOSIS — Z1231 Encounter for screening mammogram for malignant neoplasm of breast: Secondary | ICD-10-CM

## 2014-07-24 ENCOUNTER — Ambulatory Visit
Admission: RE | Admit: 2014-07-24 | Discharge: 2014-07-24 | Disposition: A | Discharge status: Home / Self Care | Payer: Medicare Other | Source: Ambulatory Visit

## 2014-07-24 DIAGNOSIS — Z1231 Encounter for screening mammogram for malignant neoplasm of breast: Secondary | ICD-10-CM

## 2014-09-10 ENCOUNTER — Ambulatory Visit: Payer: Self-pay | Admitting: Cardiology

## 2014-09-22 ENCOUNTER — Encounter: Payer: Self-pay | Admitting: Cardiology

## 2014-09-22 ENCOUNTER — Ambulatory Visit (INDEPENDENT_AMBULATORY_CARE_PROVIDER_SITE_OTHER): Payer: Medicare Other | Admitting: Cardiology

## 2014-09-22 VITALS — BP 150/70 | HR 64 | Ht 65.0 in | Wt 135.8 lb

## 2014-09-22 DIAGNOSIS — I2583 Coronary atherosclerosis due to lipid rich plaque: Principal | ICD-10-CM

## 2014-09-22 DIAGNOSIS — I2510 Atherosclerotic heart disease of native coronary artery without angina pectoris: Secondary | ICD-10-CM

## 2014-09-22 DIAGNOSIS — I10 Essential (primary) hypertension: Secondary | ICD-10-CM

## 2014-09-22 DIAGNOSIS — E785 Hyperlipidemia, unspecified: Secondary | ICD-10-CM

## 2014-09-22 MED ORDER — CLONIDINE HCL 0.2 MG PO TABS: 0.2000 mg | ORAL_TABLET | Freq: Two times a day (BID) | ORAL | Status: DC

## 2014-09-22 NOTE — Progress Notes (Signed)
Cardiology Office Note   Date:  09/22/2014   ID:  Margaret Hale, DOB 12/30/1934, MRN [id number]  PCP:  [name], MD    Chief Complaint  Patient presents with  . Chest Pain  . Coronary Artery Disease  . Hypertension  . Hyperlipidemia      History of Present Illness: Margaret Hale is a 80 y.o. female with a history of ASCAD s/p PCI of RCA with BMS 2007, HTN and dyslipidemia.  When I last saw her she complained of chest discomfort and a stress test showed no ischemia.  She then started having palpitations and a heart monitor showed no arrhythmias.  She had also been having chronic jaw pain which I did not think was cardiac related.  She is doing well.She has not had any further chest pain, jaw pain or palpitations.  She denies any SOB, DOE, LE edema or syncope   Past Medical History  Diagnosis Date  . Hyperlipidemia   . CAD (coronary artery disease) 2007    PCI of RCA, bare metal stent.....Dr. Radford Pax  . Thyroid disease     nodules,stable on u/s 2/12  . Baker's cyst     L KNEE  . Vitamin D deficiency   . Disc disorder of lumbar region     2 BULDGING DISCS DX 1990 BY DR. DALLDORF  . No pertinent past medical history     lung mass  occas cough   . Dizzy   . Hypertension     Takes Novasc ,Clonidine, & Losartan  . Cancer     lung mass    Past Surgical History  Procedure Laterality Date  . Carpal tunnel release  06/26/08        04/20/09    RIGHT HAND          LEFT HAND  . Finger contracture release  8/12    Dr. Amedeo Plenty    right hand trigger finger release  . Eye surgery  01/07/09    LEFT CATARACT  . Eye surgery  01/21/09    RIGHT CATARACT  . Coronary angioplasty      dr h Tamala Julian [phone] with one stent  . Mediastinoscopy  08/02/2011    Procedure: MEDIASTINOSCOPY;  Surgeon: Nicanor Alcon, MD;  Location: [facility] OR;  Service: Thoracic;  Laterality: N/A;     Current Outpatient Prescriptions  Medication Sig Dispense Refill  . amLODipine (NORVASC) 10 MG  tablet Take 10 mg by mouth every evening.     Marland Kitchen aspirin 81 MG tablet Take 81 mg by mouth daily.    . Calcium 1500 MG tablet Take 1,500 mg by mouth daily.    . cloNIDine (CATAPRES) 0.2 MG tablet Take 1 tablet (0.2 mg total) by mouth 2 (two) times daily. 60 tablet 6  . ezetimibe (ZETIA) 10 MG tablet Take 1 tablet (10 mg total) by mouth daily. 30 tablet 4  . losartan (COZAAR) 100 MG tablet Take 100 mg by mouth daily.    . Omega-3 Fatty Acids (FISH OIL PO) Take 1 capsule by mouth daily.    Marland Kitchen rOPINIRole (REQUIP) 0.5 MG tablet Take 1 tablet (0.5 mg total) by mouth at bedtime. 30 tablet 6  . triamcinolone cream (KENALOG) 0.1 %     . venlafaxine (EFFEXOR-XR) 37.5 MG 24 hr capsule Take 37.5 mg by mouth daily.     No current facility-administered medications for this visit.    Allergies:   Food; Lipitor; and Pantoprazole sodium    Social History:  The patient  reports that she quit smoking about 9 years ago. Her smoking use included Cigarettes. She has a 5 pack-year smoking history. She has never used smokeless tobacco. She reports that she does not drink alcohol or use illicit drugs.   Family History:  The patient's family history is negative for Anesthesia problems, Hypotension, Malignant hyperthermia, and Pseudochol deficiency.    ROS:  Please see the history of present illness.   Otherwise, review of systems are positive for none.   All other systems are reviewed and negative.    PHYSICAL EXAM: VS:  BP 150/70 mmHg  Pulse 64  Ht '5\' 5"'$  (1.651 m)  Wt 135 lb 12.8 oz (61.598 kg)  BMI 22.60 kg/m2  SpO2 94% , BMI Body mass index is 22.6 kg/(m^2). GEN: Well nourished, well developed, in no acute distress HEENT: normal Neck: no JVD, carotid bruits, or masses Cardiac: RRR; no murmurs, rubs, or gallops,no edema  Respiratory:  clear to auscultation bilaterally, normal work of breathing GI: soft, nontender, nondistended, + BS MS: no deformity or atrophy Skin: warm and dry, no rash Neuro:   Strength and sensation are intact Psych: euthymic mood, full affect   EKG:  EKG is not ordered today.    Recent Labs: 02/24/2014: ALT 25    Lipid Panel    Component Value Date/Time   CHOL 222* 02/24/2014 1510   TRIG 71.0 02/24/2014 1510   HDL 68.00 02/24/2014 1510   CHOLHDL 3 02/24/2014 1510   VLDL 14.2 02/24/2014 1510   LDLCALC 140* 02/24/2014 1510      Wt Readings from Last 3 Encounters:  09/22/14 135 lb 12.8 oz (61.598 kg)  02/24/14 149 lb (67.586 kg)  02/12/14 148 lb (67.132 kg)     ASSESSMENT AND PLAN: 1.  ASCAD s/p PCI of RCA 2007 - recent nuclear stress test was fine.  CP and jaw pain have resolved.   - continue ASA - continue walking for exercise 2.  HTN - BP borderline elevated - at home it runs 621-308 systolic -continue amlodipine/clonidine/Cozaar - increase clonidine to 0.'2mg'$  BID - nurse check in 1 week for BP check 3.  Dyslipidemia - intolerant to lipitor and simastatin in the past. Her last LDL 01/2014 was not at goal of <70 (140) - She is now on Zetia - recheck FLP and ALT 4. Restless legs syndrome - continue Requip  Current medicines are reviewed at length with the patient today.  The patient does not have concerns regarding medicines.  The following changes have been made:  no change  Labs/ tests ordered today include: see above assessment and plan  Orders Placed This Encounter  Procedures  . Lipid panel  . Hepatic function panel     Disposition:   FU with me in 6 months   Signed, Sueanne Margarita, MD  09/22/2014 3:45 PM    [facility] [address], [place], [place]  [address] Phone: [phone]; Fax: [phone]

## 2014-09-22 NOTE — Patient Instructions (Addendum)
Medication Instructions:  Your physician has recommended you make the following change in your medication:  1) INCREASE CLONIDINE to 0.2 mg daily  Labwork: Your physician recommends that you return for FASTING lab work on Thursday, May 5th. You may come ANY TIME between 7:30 AM and 5:00 PM.  Testing/Procedures: None  Follow-Up: Your physician wants you to follow-up in: 6 months with Dr. Radford Pax. You will receive a reminder letter in the mail two months in advance. If you don't receive a letter, please call our office to schedule the follow-up appointment.   Any Other Special Instructions Will Be Listed Below (If Applicable).

## 2014-10-01 ENCOUNTER — Other Ambulatory Visit (INDEPENDENT_AMBULATORY_CARE_PROVIDER_SITE_OTHER): Payer: Medicare Other | Admitting: *Deleted

## 2014-10-01 DIAGNOSIS — E785 Hyperlipidemia, unspecified: Secondary | ICD-10-CM

## 2014-10-01 LAB — HEPATIC FUNCTION PANEL
ALT: 20 U/L (ref 0–35)
AST: 18 U/L (ref 0–37)
Albumin: 4 g/dL (ref 3.5–5.2)
Alkaline Phosphatase: 90 U/L (ref 39–117)
Bilirubin, Direct: 0.1 mg/dL (ref 0.0–0.3)
TOTAL PROTEIN: 7.1 g/dL (ref 6.0–8.3)
Total Bilirubin: 0.6 mg/dL (ref 0.2–1.2)

## 2014-10-01 LAB — LIPID PANEL
CHOL/HDL RATIO: 3
CHOLESTEROL: 195 mg/dL (ref 0–200)
HDL: 74.7 mg/dL (ref >39.00)
LDL Cholesterol: 108 mg/dL — ABNORMAL HIGH (ref 0–99)
NONHDL: 120.3
Triglycerides: 63 mg/dL (ref 0.0–149.0)
VLDL: 12.6 mg/dL (ref 0.0–40.0)

## 2015-03-11 ENCOUNTER — Other Ambulatory Visit: Payer: Self-pay | Admitting: Cardiothoracic Surgery

## 2015-03-11 DIAGNOSIS — R911 Solitary pulmonary nodule: Secondary | ICD-10-CM

## 2015-03-24 ENCOUNTER — Ambulatory Visit
Admission: RE | Admit: 2015-03-24 | Discharge: 2015-03-24 | Disposition: A | Discharge status: Home / Self Care | Payer: Medicare Other | Source: Ambulatory Visit | Attending: Cardiothoracic Surgery | Admitting: Cardiothoracic Surgery

## 2015-03-24 ENCOUNTER — Ambulatory Visit (INDEPENDENT_AMBULATORY_CARE_PROVIDER_SITE_OTHER): Payer: Medicare Other | Admitting: Cardiothoracic Surgery

## 2015-03-24 ENCOUNTER — Encounter: Payer: Self-pay | Admitting: Cardiothoracic Surgery

## 2015-03-24 VITALS — BP 155/83 | HR 60 | Resp 20 | Ht 65.0 in | Wt 145.0 lb

## 2015-03-24 DIAGNOSIS — R918 Other nonspecific abnormal finding of lung field: Secondary | ICD-10-CM | POA: Diagnosis not present

## 2015-03-24 DIAGNOSIS — R911 Solitary pulmonary nodule: Secondary | ICD-10-CM

## 2015-03-24 NOTE — Progress Notes (Signed)
[address]       ,[place] [address]             [phone]         Crystol Dillenburg Anton Medical Record #[id number] Date of Birth: [birth year]  Referring: [name], MD Primary Care: [name], MD  Chief Complaint:    Chief Complaint  Patient presents with  . Follow-up    1 year f/u with Chest CT    History of Present Illness:    She had a previous biopsy of the left upper lobe lesion that was showed though some reactive changes and atypical cyst and some questionable atypical cells. Followup low-dose CT scan today showed that the lesions had not changed. The left upper lobe lesion was still 16 mm in the right upper lobe lesion was 10 mm. Her mediastinoscopy was negative and it showed granulomatous disease. The patient was seen by Dr [name] in May 2013 and was  not interested in any further biopsies or removal at the present time. Dr [name]  did explain to her that if  these lesions get bigger than resection will need to be done. She has had serial ct scans since that time. The left upper lobe lesion resolved  She retuns today with follow CT scan.  No anginal type pains, even with daily exercise denies angina. She denies cough hemoptysis or recurrent pulmonary infections. She stay off cigarettes since 2007 She does note some early morning sputum production.   Current Activity/ Functional Status: Patient is independent with mobility/ambulation, transfers, ADL's, IADL's.   Past Medical History  Diagnosis Date  . Hyperlipidemia   . CAD (coronary artery disease) 2007    PCI of RCA, bare metal stent.....Dr. Radford Pax  . Thyroid disease     nodules,stable on u/s 2/12  . Baker's cyst     L KNEE  . Vitamin D deficiency   . Disc disorder of lumbar region     2 BULDGING DISCS DX 1990 BY DR. DALLDORF  . No pertinent past medical history     lung mass  occas cough   . Dizzy   . Hypertension     Takes Novasc ,Clonidine, & Losartan  . Cancer  ([place])     lung mass    Past Surgical History  Procedure Laterality Date  . Carpal tunnel release  06/26/08        04/20/09    RIGHT HAND          LEFT HAND  . Finger contracture release  8/12    Dr. Amedeo Plenty    right hand trigger finger release  . Eye surgery  01/07/09    LEFT CATARACT  . Eye surgery  01/21/09    RIGHT CATARACT  . Coronary angioplasty      dr h Tamala Julian [phone] with one stent  . Mediastinoscopy  08/02/2011    Procedure: MEDIASTINOSCOPY;  Surgeon: Nicanor Alcon, MD;  Location: [facility] OR;  Service: Thoracic;  Laterality: N/A;    Family History  Problem Relation Age of Onset  . Anesthesia problems Neg Hx   . Hypotension Neg Hx   . Malignant hyperthermia Neg Hx   . Pseudochol deficiency Neg Hx     Social History   Social History  . Marital Status: Widowed    Spouse Name: N/A  . Number of Children: 2  . Years of Education: N/A   Occupational History  . Retired    Science writer  History Main Topics  . Smoking status: Former Smoker -- 0.50 packs/day for 10 years    Types: Cigarettes    Quit date: 07/12/2005  . Smokeless tobacco: Never Used  . Alcohol Use: No  . Drug Use: No  . Sexual Activity: Not on file   Other Topics Concern  . Not on file   Social History Narrative    History  Smoking status  . Former Smoker -- 0.50 packs/day for 10 years  . Types: Cigarettes  . Quit date: 07/12/2005  Smokeless tobacco  . Never Used    History  Alcohol Use No     Allergies  Allergen Reactions  . Food     Sausage, hot dogs, salami (rash)  . Lipitor [Atorvastatin Calcium] Other (See Comments)    MYALGIAS  . Pantoprazole Sodium Rash    Current Outpatient Prescriptions  Medication Sig Dispense Refill  . amLODipine (NORVASC) 10 MG tablet Take 10 mg by mouth every evening.     Marland Kitchen aspirin 81 MG tablet Take 81 mg by mouth daily.    . Calcium 1500 MG tablet Take 1,500 mg by mouth daily.    . cloNIDine (CATAPRES) 0.2 MG tablet Take 1 tablet (0.2 mg total) by mouth 2  (two) times daily. 60 tablet 6  . ezetimibe (ZETIA) 10 MG tablet Take 1 tablet (10 mg total) by mouth daily. 30 tablet 4  . losartan (COZAAR) 100 MG tablet Take 100 mg by mouth daily.    . Omega-3 Fatty Acids (FISH OIL PO) Take 1 capsule by mouth daily.    Marland Kitchen rOPINIRole (REQUIP) 0.5 MG tablet Take 1 tablet (0.5 mg total) by mouth at bedtime. 30 tablet 6  . triamcinolone cream (KENALOG) 0.1 %     . venlafaxine (EFFEXOR-XR) 37.5 MG 24 hr capsule Take 37.5 mg by mouth daily.     No current facility-administered medications for this visit.       Review of Systems:     Cardiac Review of Systems: Y or N  Chest Pain [  n  ]  Resting SOB [n   ] Exertional SOB  [ n ]  Orthopnea [n  ]   Pedal Edema [ n  ]    Palpitations [ n ] Syncope  [n  ]   Presyncope [ n  ]  General Review of Systems: [Y] = yes [  ]=no Constitional: recent weight change [  ]; anorexia [  ]; fatigue [ n ]; nausea [n  ]; night sweats [ n ]; fever [ n ]; or chills [n  ];                                                                                                                                          Dental: poor dentition[n  ];   Eye : blurred vision [  ]; diplopia [   ];  vision changes [  ];  Amaurosis fugax[  ]; Resp: cough [ y ];  wheezing[ n ];  hemoptysis[n  ]; shortness of breath[n ]; paroxysmal nocturnal dyspnea[ n]; dyspnea on exertion[  ]; or orthopnea[  ];  GI:  gallstones[  ], vomiting[  ];  dysphagia[  ]; melena[  ];  hematochezia [  ]; heartburn[  ];   Hx of  Colonoscopy[ yes last year ]; GU: kidney stones [  ]; hematuria[  ];   dysuria [  ];  nocturia[  ];  history of     obstruction [  ];             Skin: rash, swelling[  ];, hair loss[  ];  peripheral edema[  ];  or itching[  ]; Musculosketetal: myalgias[  ];  joint swelling[  ];  joint erythema[  ];  joint pain[  ];  back pain[  ];  Heme/Lymph: bruising[  ];  bleeding[  ];  anemia[  ];  Neuro: TIA[  ];  headaches[  ];  stroke[  ];  vertigo[  ];  seizures[   ];   paresthesias[  ];  difficulty walking[  ]; restless legs at night better on medsy  Psych:depression[  ]; anxiety[  ];  Endocrine: diabetes[  ];  thyroid dysfunction[  ];  Immunizations: Flu [ yes ]; Pneumococcal[unknown  ];  Other:  Physical Exam: BP 155/83 mmHg  Pulse 60  Resp 20  Ht '5\' 5"'$  (1.651 m)  Wt 145 lb (65.772 kg)  BMI 24.13 kg/m2  SpO2 98%  General appearance: alert, cooperative, appears stated age and no distress Neurologic: intact Heart: regular rate and rhythm, S1, S2 normal, no murmur, click, rub or gallop and normal apical impulse Lungs: clear to auscultation bilaterally and normal percussion bilaterally Abdomen: soft, non-tender; bowel sounds normal; no masses,  no organomegaly Extremities: extremities normal, atraumatic, no cyanosis or edema, Homans sign is negative, no sign of DVT and no edema, redness or tenderness in the calves or thighs no cervicall or axillary adenopathy No carotid bruits  Diagnostic Studies & Laboratory data:     Recent Radiology Findings: Ct Chest Wo Contrast  03/24/2015  CLINICAL DATA:  Followup lung nodules EXAM: CT CHEST WITHOUT CONTRAST TECHNIQUE: Multidetector CT imaging of the chest was performed following the standard protocol without IV contrast. COMPARISON:  02/12/2014 FINDINGS: Mediastinum: The heart size appears within normal limits. No pericardial effusion identified. Calcified atherosclerotic disease involves the thoracic aorta as well as the RCA, LAD and left circumflex coronary artery. 9 mm right paratracheal lymph node is stable. There is a calcified sub- carinal lymph node noted. Lungs/Pleura: The right upper lobe part solid nodule measures 1.5 cm and has a central solid component which measures 6 mm. On the previous exam this nodule measured 1.2 cm. Solid right upper lobe lung nodule is stable measuring 3 mm, image 26/series 4. Small ground-glass attenuating nodule within the right upper lobe measures 5 mm, image 30 of  series 4. Previously 3 mm. Solid right middle lobe lung nodule measures 11 mm and is unchanged from previous exam, image 34 of series 4. Stable left upper lobe ground-glass attenuating nodule measuring 7 mm, image 31 of series 4. Stable solid nodule within the left upper lobe measuring 4 mm. Upper Abdomen: There is no focal liver abnormality identified. The adrenal glands are within normal limits. Normal appearance of the spleen. The visualized portions of the pancreas and left kidney are normal. Musculoskeletal: Spondylosis noted  within the thoracic spine. No aggressive lytic or sclerotic bone lesions. IMPRESSION: 1. Multiple solid and part solid nodules are again identified within both lungs. Within the right upper lobe there is a part solid nodule which has increased in size from previous exam. This now measures 1.5 cm and has a central solid component of 6 mm. 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 Biopsy or surgical resection of this nodule is recommended. 2. The other small nodules are unchanged. 3. Aortic atherosclerosis as well as 3 vessel coronary artery calcification. Electronically Signed   By: Kerby Moors M.D.   On: 03/24/2015 15:53   Ct Chest Wo Contrast  02/12/2014   CLINICAL DATA:  Followup right upper lobe pulmonary nodule. Other nodules have been stable for more than 2 years, compatible with a benign etiology.  EXAM: CT CHEST WITHOUT CONTRAST  TECHNIQUE: Multidetector CT imaging of the chest was performed following the standard protocol without IV contrast.  COMPARISON:  08/18/2013  FINDINGS: Biapical pleural thickening noted. Tracheal diverticulum in the 7 o'clock position image 12 is reidentified. Areas of subpleural scarring are stable. 5 mm right upper lobe pulmonary nodule image 18 is stable. Other pulmonary nodules identified elsewhere are stable. Intrapulmonary lymph node abutting the minor fissure image 34 is stable. Left lower lobe calcified granuloma image 38 is  stable.  Multilevel disc degenerative change noted. No compression deformity.  Thyroid nodularity is stable. Stable small pretracheal nodes. Assessment for central lymphadenopathy is suboptimal due to lack of contrast. Stable 1 cm dominant pretracheal node image 26. Moderate atheromatous aortic calcification identified. Heart size is normal. No pleural effusion or pericardial effusion. Aortic ectasia at the level of the distal arch is stable measuring 3.5 cm image 21.  IMPRESSION: Stable right upper lobe pulmonary nodule for which followup was previously recommended due to nonvisualization on previous studies. Other previously evaluated nodules elsewhere are stable and do not require specific follow-up. Followup chest CT is recommended March 2016.   Electronically Signed   By: Conchita Paris M.D.   On: 02/12/2014 14:21  Ct Chest Wo Contrast  08/18/2013   CLINICAL DATA:  Followup of pulmonary nodules.  Ex-smoker.  EXAM: CT CHEST WITHOUT CONTRAST  TECHNIQUE: Multidetector CT imaging of the chest was performed following the standard protocol without IV contrast.  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  Mild centrilobular emphysema. Suspicion of either a soft tissue or a ground-glass opacity nodule developing around a branching pulmonary artery. On the order of 5 mm on image 18/series 4 and 6 mm on image 57 coronal. This is not readily apparent comparing back to 1/30 /13.  Favor scarring in the subpleural medial right apex on image 14, unchanged.  A 3 mm right upper lobe lung nodule on image 26/series 4 is similar back to 06/29/2011, consistent with a benign etiology.  Right middle lobe subpleural nodule measures 1.0 x 0.9 cm on image 36 and is similar back to 2013, consistent with a benign etiology.  Stable 3 mm  lingular nodule on image 34/series 4. This was present back to the 2013 exam.  No pleural fluid.  Heart/Mediastinum: Low-density right-sided thyroid nodules which are incompletely characterized but were grossly similar back to 2013. No mediastinal or definite hilar adenopathy, given limitations of unenhanced CT. Aortic and branch  vessel atherosclerosis. Normal heart size with multivessel coronary artery atherosclerosis. No pericardial effusion.  Upper Abdomen:  Normal adrenal glands.  Bones/Musculoskeletal: Sclerosis involving the 6 posterior lateral left rib, likely posttraumatic.  IMPRESSION: 1. Similar appearance of right middle lobe, right upper lobe, and lingular nodules since 2013, consistent with a benign etiology. 2. 5-6 mm right upper lobe lung nodule. This is likely new when compared to remote prior exams. Given the smoking history, follow-up chest CT at 6-12 months is recommended. This recommendation follows the consensus statement: "Guidelines for Management of Small Pulmonary Nodules Detected on CT Scans: A Statement from the [place]" as published in Radiology2005;237:395-400. Online at: https://www.arnold.com/. 3.  Atherosclerosis, including within the coronary arteries.   Electronically Signed   By: Abigail Miyamoto M.D.   On: 08/18/2013 16:54     Ct Chest Low Dose Pilot W/o Cm  08/15/2012  *RADIOLOGY REPORT*  Clinical Data: History of smoking.  Pulmonary nodules noted on prior chest CT 02/15/2012.  Follow-up examination.  CT CHEST LOW DOSE PILOT WITHOUT CONTRAST  Technique: Multidetector CT imaging of the chest using the standard low-dose protocol without administration of intravenous contrast.  Comparison: Chest CT 02/15/2012.  Findings:  Mediastinum: Heart size is normal. Small amount of pericardial fluid and/or thickening is unlikely to be of any hemodynamic significance of this time.  No associated pericardial calcification. There is atherosclerosis of the  thoracic aorta, the great vessels of the mediastinum and the coronary arteries, including calcified atherosclerotic plaque in the left main, left anterior descending, left circumflex and right coronary arteries. Calcifications of the mitral annulus. No pathologically enlarged mediastinal or hilar lymph nodes. Please note that accurate exclusion of hilar adenopathy is limited on noncontrast CT scans. Esophagus is unremarkable in appearance.  Lungs/Pleura: Previously described 4 mm nodule in the left upper lobe is unchanged (image 122 of series 3) measuring only 4 mm on today's examination.  Previously noted right middle lobe nodule abutting the minor fissure is unchanged measuring 10 x 9 mm (image 138 of series 3).  No other definite new or enlarging suspicious appearing pulmonary nodules or masses are otherwise noted. Bilateral apical nodular areas of pleuroparenchymal thickening are unchanged compared to prior examinations and most compatible with areas of chronic scarring.  No acute consolidative airspace disease.  No pleural effusions.  There is a background of mild diffuse bronchial wall thickening with mild centrilobular and paraseptal emphysema.  Upper Abdomen: Unremarkable.  Musculoskeletal: There are no aggressive appearing lytic or blastic lesions noted in the visualized portions of the skeleton.  IMPRESSION: 1.  No change in previously noted 10 x 9 mm right middle lobe nodule or 4 mm nodule in the lingula when compared to remote prior examination from 06/29/2011.  Based on the behavior of these nodules, these are favored to be benign, however, continued attention on follow-up studies is recommended. At this time, a 1 year follow-up chest CT in March 2015 is recommended. 2.  Atherosclerosis, including left main and three-vessel coronary artery disease.  Assessment for potential risk factor modification, dietary therapy or pharmacologic therapy may be warranted, if clinically indicated.  3.  Mild diffuse  bronchial wall thickening with mild centrilobular paraseptal emphysema.   Original Report Authenticated By: Vinnie Langton, M.D.       Recent Lab Findings: Lab Results  Component Value Date   WBC 5.5 08/01/2011   HGB 13.7 08/01/2011   HCT 39.4 08/01/2011   PLT 172 08/01/2011   GLUCOSE 97 08/01/2011   CHOL  195 10/01/2014   TRIG 63.0 10/01/2014   HDL 74.70 10/01/2014   LDLCALC 108* 10/01/2014   ALT 20 10/01/2014   AST 18 10/01/2014   NA 140 08/01/2011   K 3.5 08/01/2011   CL 103 08/01/2011   CREATININE 0.60 08/01/2011   BUN 13 08/01/2011   CO2 29 08/01/2011   INR 0.98 08/01/2011      Assessment / Plan:    Multiple solid and part solid nodules are again identified within both lungs. Within the right upper lobe there is a part solid nodule which has increased in size from previous exam. This now measures 1.5 cm and has a central solid component of 6 mm. This is worrisome for a low grade pulmonary adenocarcinoma- we'll plan to proceed with PET scan and then based on these results probable navigation bronchoscopy to biopsy the right upper lobe lesion. After the PET scan is done the patient will return to see me. Atherosclerosis, including left main and three-vessel coronary artery disease, incidental noted on CT.  Known CAD with previous stent RCA, Followed by Dr Jacelyn Grip MD  [facility] [phone] Office [phone] 03/24/2015 4:10 PM

## 2015-03-25 ENCOUNTER — Encounter: Payer: Self-pay | Admitting: *Deleted

## 2015-03-25 ENCOUNTER — Other Ambulatory Visit: Payer: Self-pay | Admitting: *Deleted

## 2015-03-25 DIAGNOSIS — R911 Solitary pulmonary nodule: Secondary | ICD-10-CM

## 2015-04-07 ENCOUNTER — Ambulatory Visit (HOSPITAL_COMMUNITY)
Admission: RE | Admit: 2015-04-07 | Discharge: 2015-04-07 | Disposition: A | Discharge status: Home / Self Care | Payer: Medicare Other | Source: Ambulatory Visit | Attending: Cardiothoracic Surgery | Admitting: Cardiothoracic Surgery

## 2015-04-07 DIAGNOSIS — R911 Solitary pulmonary nodule: Secondary | ICD-10-CM | POA: Diagnosis present

## 2015-04-07 DIAGNOSIS — I2510 Atherosclerotic heart disease of native coronary artery without angina pectoris: Secondary | ICD-10-CM | POA: Diagnosis not present

## 2015-04-07 DIAGNOSIS — E042 Nontoxic multinodular goiter: Secondary | ICD-10-CM | POA: Insufficient documentation

## 2015-04-07 DIAGNOSIS — R590 Localized enlarged lymph nodes: Secondary | ICD-10-CM | POA: Insufficient documentation

## 2015-04-07 DIAGNOSIS — R918 Other nonspecific abnormal finding of lung field: Secondary | ICD-10-CM | POA: Insufficient documentation

## 2015-04-07 MED ORDER — FLUDEOXYGLUCOSE F - 18 (FDG) INJECTION
7.7000 | Freq: Once | INTRAVENOUS | Status: DC | PRN
  Administered 2015-04-07: 7.7 via INTRAVENOUS
  Filled 2015-04-07: qty 7.7

## 2015-04-12 ENCOUNTER — Encounter: Payer: Medicare Other | Admitting: Cardiothoracic Surgery

## 2015-04-13 ENCOUNTER — Ambulatory Visit (INDEPENDENT_AMBULATORY_CARE_PROVIDER_SITE_OTHER): Payer: Medicare Other | Admitting: Cardiothoracic Surgery

## 2015-04-13 ENCOUNTER — Other Ambulatory Visit: Payer: Self-pay | Admitting: *Deleted

## 2015-04-13 ENCOUNTER — Encounter: Payer: Self-pay | Admitting: Cardiothoracic Surgery

## 2015-04-13 VITALS — BP 185/85 | HR 56 | Resp 20 | Ht 65.0 in | Wt 145.0 lb

## 2015-04-13 DIAGNOSIS — G939 Disorder of brain, unspecified: Secondary | ICD-10-CM

## 2015-04-13 DIAGNOSIS — R918 Other nonspecific abnormal finding of lung field: Secondary | ICD-10-CM

## 2015-04-13 LAB — GLUCOSE, CAPILLARY: Glucose-Capillary: 112 mg/dL — ABNORMAL HIGH (ref 65–99)

## 2015-04-13 NOTE — Progress Notes (Signed)
[address]       [place] [address]             [phone]         Margaret [facility] [place] Medical Record #[id number] Date of Birth: 1935-01-20  Referring: [name], MD Primary Care: [name], MD  Chief Complaint:    Chief Complaint  Patient presents with  . Lung Lesion    2 week f/u review PET Scan 04/07/15    History of Present Illness:    She had a previous biopsy of the left upper lobe lesion that was showed though some reactive changes and atypical cyst and some questionable atypical cells. Followup low-dose CT scan today showed that the lesions had not changed. The left upper lobe lesion was still 16 mm in the right upper lobe lesion was 10 mm. Her mediastinoscopy was negative and it showed granulomatous disease. The patient was seen by Dr [name] in May 2013 and was  not interested in any further biopsies or removal at the present time. Dr [name]  did explain to her that if  these lesions get bigger than resection will need to be done. She has had serial ct scans since that time. The left upper lobe lesion resolved. Recent CT scan done last week showed resolution of some lesions and increasing size of a right upper lobe lesion.  She retuns today with follow PET scan..  No anginal type pains, even with daily exercise denies angina. She denies cough hemoptysis or recurrent pulmonary infections. She stay off cigarettes since 2007 She does note some early morning sputum production.   Current Activity/ Functional Status: Patient is independent with mobility/ambulation, transfers, ADL's, IADL's.   Past Medical History  Diagnosis Date  . Hyperlipidemia   . CAD (coronary artery disease) 2007    PCI of RCA, bare metal stent.....Dr. Radford Pax  . Thyroid disease     nodules,stable on u/s 2/12  . Baker's cyst     L KNEE  . Vitamin D deficiency   . Disc disorder of lumbar region     2 BULDGING DISCS DX 1990 BY DR. DALLDORF  . No pertinent  past medical history     lung mass  occas cough   . Dizzy   . Hypertension     Takes Novasc ,Clonidine, & Losartan  . Cancer ([place])     lung mass    Past Surgical History  Procedure Laterality Date  . Carpal tunnel release  06/26/08        04/20/09    RIGHT HAND          LEFT HAND  . Finger contracture release  8/12    Dr. Amedeo Plenty    right hand trigger finger release  . Eye surgery  01/07/09    LEFT CATARACT  . Eye surgery  01/21/09    RIGHT CATARACT  . Coronary angioplasty      dr h Tamala Julian [phone] with one stent  . Mediastinoscopy  08/02/2011    Procedure: MEDIASTINOSCOPY;  Surgeon: Nicanor Alcon, MD;  Location: [facility] OR;  Service: Thoracic;  Laterality: N/A;    Family History  Problem Relation Age of Onset  . Anesthesia problems Neg Hx   . Hypotension Neg Hx   . Malignant hyperthermia Neg Hx   . Pseudochol deficiency Neg Hx     Social History   Social History  . Marital Status: Widowed    Spouse Name: N/A  .  Number of Children: 2  . Years of Education: N/A   Occupational History  . Retired    Social History Main Topics  . Smoking status: Former Smoker -- 0.50 packs/day for 10 years    Types: Cigarettes    Quit date: 07/12/2005  . Smokeless tobacco: Never Used  . Alcohol Use: No  . Drug Use: No  . Sexual Activity: Not on file   Other Topics Concern  . Not on file   Social History Narrative    History  Smoking status  . Former Smoker -- 0.50 packs/day for 10 years  . Types: Cigarettes  . Quit date: 07/12/2005  Smokeless tobacco  . Never Used    History  Alcohol Use No     Allergies  Allergen Reactions  . Food     Sausage, hot dogs, salami (rash)  . Lipitor [Atorvastatin Calcium] Other (See Comments)    MYALGIAS  . Pantoprazole Sodium Rash    Current Outpatient Prescriptions  Medication Sig Dispense Refill  . amLODipine (NORVASC) 10 MG tablet Take 10 mg by mouth every evening.     Marland Kitchen aspirin 81 MG tablet Take 81 mg by mouth daily.    .  Calcium 1500 MG tablet Take 1,500 mg by mouth daily.    . cloNIDine (CATAPRES) 0.2 MG tablet Take 1 tablet (0.2 mg total) by mouth 2 (two) times daily. 60 tablet 6  . ezetimibe (ZETIA) 10 MG tablet Take 1 tablet (10 mg total) by mouth daily. 30 tablet 4  . losartan (COZAAR) 100 MG tablet Take 100 mg by mouth daily.    . Omega-3 Fatty Acids (FISH OIL PO) Take 1 capsule by mouth daily.    Marland Kitchen rOPINIRole (REQUIP) 0.5 MG tablet Take 1 tablet (0.5 mg total) by mouth at bedtime. 30 tablet 6  . triamcinolone cream (KENALOG) 0.1 %     . venlafaxine (EFFEXOR-XR) 37.5 MG 24 hr capsule Take 37.5 mg by mouth daily.     No current facility-administered medications for this visit.       Review of Systems:     Cardiac Review of Systems: Y or N  Chest Pain [  n  ]  Resting SOB [n   ] Exertional SOB  [ n ]  Orthopnea [n  ]   Pedal Edema [ n  ]    Palpitations [ n ] Syncope  [n  ]   Presyncope [ n  ]  General Review of Systems: [Y] = yes [  ]=no Constitional: recent weight change [  ]; anorexia [  ]; fatigue [ n ]; nausea [n  ]; night sweats [ n ]; fever [ n ]; or chills [n  ];  Dental: poor dentition[n  ];   Eye : blurred vision [  ]; diplopia [   ]; vision changes [  ];  Amaurosis fugax[  ]; Resp: cough [ y ];  wheezing[ n ];  hemoptysis[n  ]; shortness of breath[n ]; paroxysmal nocturnal dyspnea[ n]; dyspnea on exertion[  ]; or orthopnea[  ];  GI:  gallstones[  ], vomiting[  ];  dysphagia[  ]; melena[  ];  hematochezia [  ]; heartburn[  ];   Hx of  Colonoscopy[ yes last year ]; GU: kidney stones [  ]; hematuria[  ];   dysuria [  ];  nocturia[  ];  history of     obstruction [  ];             Skin: rash, swelling[  ];, hair loss[  ];  peripheral edema[  ];  or itching[  ]; Musculosketetal: myalgias[  ];  joint swelling[  ];  joint erythema[  ];  joint pain[  ];  back  pain[  ];  Heme/Lymph: bruising[  ];  bleeding[  ];  anemia[  ];  Neuro: TIA[  ];  headaches[  ];  stroke[no history of stroke  ];  vertigo[  ];  seizures[  ];   paresthesias[  ];  difficulty walking[  ]; restless legs at night better on medsy  Psych:depression[  ]; anxiety[  ];  Endocrine: diabetes[  ];  thyroid dysfunction[  ];  Immunizations: Flu [ yes ]; Pneumococcal[unknown  ];  Other:  Physical Exam: BP 185/85 mmHg  Pulse 56  Resp 20  Ht '5\' 5"'$  (1.651 m)  Wt 145 lb (65.772 kg)  BMI 24.13 kg/m2  SpO2 98%  General appearance: alert, cooperative, appears stated age and no distress Neurologic: intact Heart: regular rate and rhythm, S1, S2 normal, no murmur, click, rub or gallop and normal apical impulse Lungs: clear to auscultation bilaterally and normal percussion bilaterally Abdomen: soft, non-tender; bowel sounds normal; no masses,  no organomegaly Extremities: extremities normal, atraumatic, no cyanosis or edema, Homans sign is negative, no sign of DVT and no edema, redness or tenderness in the calves or thighs no cervicall or axillary adenopathy No carotid bruits  Diagnostic Studies & Laboratory data:     Recent Radiology Findings: .Nm Pet Image Initial (pi) Skull Base To Thigh  04/07/2015  CLINICAL DATA:  Initial treatment strategy for lung nodules. EXAM: NUCLEAR MEDICINE PET SKULL BASE TO THIGH TECHNIQUE: 7.7 mCi F-18 FDG was injected intravenously. Full-ring PET imaging was performed from the skull base to thigh after the radiotracer. CT data was obtained and used for attenuation correction and anatomic localization. FASTING BLOOD GLUCOSE:  Value: 112 mg/dl COMPARISON:  03/24/2015 ; 07/07/2011 FINDINGS: NECK Abnormal hypodense lesion inferiorly in the right temporal lobe with associated hypometabolism. This lesion is only partially included. Visualized hypodense portion measures 4.2 by 3.1 cm on image 1 of series 4. Symmetric physiologic salivary and glottic activity. Right  thyroid nodules are similar to prior exams and not hypermetabolic. CHEST Hypermetabolic paratracheal, bilateral hilar, and AP window lymph nodes are present along with a hypermetabolic right infrahilar lymph node. An index paratracheal node measures 7 mm on thickness on image 55 series 4 with maximum standard uptake value 8.3. This node was previously not appreciably hypermetabolic. A right hilar node is difficult to measure in size but has a maximum standard uptake value of 16.3 (formerly 11.5 Bilateral pulmonary nodules are present. A right middle lobe nodule measuring 0.9 by 1.2  cm on image 40 of series 6 (previously the same size on 10/11/2011) has a maximum standard uptake value of 6.4 (previously 5.2 back on 07/07/2011). A previous left upper lobe nodule from 10/11/2011 which was hypermetabolic has resolved in the antrum. Other hypermetabolic nodules are present. The prior hypermetabolic left rib lesion is no longer hypermetabolic and has healed. A 1.6 by 1.4 cm right upper lobe nodule with solid and sub solid components has maximum standard uptake value 4.2, measured on image 22 series 6. This nodule was not present in 2013. Dense mitral calcification. Coronary, aortic arch, and branch vessel atherosclerotic vascular disease. ABDOMEN/PELVIS Dense calcifications in the right lower quadrant are extraluminal and probably vascular. No associated hypermetabolic activity. Aortoiliac atherosclerotic vascular disease. SKELETON No focal hypermetabolic activity to suggest skeletal metastasis. IMPRESSION: 1. Generally similar appearance of hypermetabolic thoracic adenopathy and scattered hypermetabolic pulmonary nodules, compared to the 2013 prior PET-CT. There is new right upper lobe hypermetabolic nodule since that time, but also interval resolution of a right middle lobe hypermetabolic nodule since that time. Looking back, some of these nodules have been present since 2007, along with some of the thoracic adenopathy.  Suspicion is those raised for sarcoid or similar granulomatous process. It is difficult to exclude a new synchronous right upper lobe lung cancer, but the hypermetabolic mixed density nodule could easily be due to presumed underlying granulomatous process. Biopsy or observation suggested. 2. There is a new 4.2 by 3.1 cm hypodense and hypometabolic lesion in the right temporal lobe. If the patient has had a stroke over the past 3 years than that might explain this. Otherwise, this could represent a cystic mass or other lesion, and I would recommend MRI brain with and without contrast. 3. Multinodular goiter primarily involving the right lobe. 4. Coronary, aortic arch, and branch vessel atherosclerotic vascular disease. Dense mitral valve calcification. Electronically Signed   By: Van Clines M.D.   On: 04/07/2015 15:23    Ct Chest Wo Contrast  03/24/2015  CLINICAL DATA:  Followup lung nodules EXAM: CT CHEST WITHOUT CONTRAST TECHNIQUE: Multidetector CT imaging of the chest was performed following the standard protocol without IV contrast. COMPARISON:  02/12/2014 FINDINGS: Mediastinum: The heart size appears within normal limits. No pericardial effusion identified. Calcified atherosclerotic disease involves the thoracic aorta as well as the RCA, LAD and left circumflex coronary artery. 9 mm right paratracheal lymph node is stable. There is a calcified sub- carinal lymph node noted. Lungs/Pleura: The right upper lobe part solid nodule measures 1.5 cm and has a central solid component which measures 6 mm. On the previous exam this nodule measured 1.2 cm. Solid right upper lobe lung nodule is stable measuring 3 mm, image 26/series 4. Small ground-glass attenuating nodule within the right upper lobe measures 5 mm, image 30 of series 4. Previously 3 mm. Solid right middle lobe lung nodule measures 11 mm and is unchanged from previous exam, image 34 of series 4. Stable left upper lobe ground-glass attenuating  nodule measuring 7 mm, image 31 of series 4. Stable solid nodule within the left upper lobe measuring 4 mm. Upper Abdomen: There is no focal liver abnormality identified. The adrenal glands are within normal limits. Normal appearance of the spleen. The visualized portions of the pancreas and left kidney are normal. Musculoskeletal: Spondylosis noted within the thoracic spine. No aggressive lytic or sclerotic bone lesions. IMPRESSION: 1. Multiple solid and part solid nodules are again identified within both lungs. Within the right upper lobe there is a  part solid nodule which has increased in size from previous exam. This now measures 1.5 cm and has a central solid component of 6 mm. 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 Biopsy or surgical resection of this nodule is recommended. 2. The other small nodules are unchanged. 3. Aortic atherosclerosis as well as 3 vessel coronary artery calcification. Electronically Signed   By: Kerby Moors M.D.   On: 03/24/2015 15:53   Ct Chest Wo Contrast  02/12/2014   CLINICAL DATA:  Followup right upper lobe pulmonary nodule. Other nodules have been stable for more than 2 years, compatible with a benign etiology.  EXAM: CT CHEST WITHOUT CONTRAST  TECHNIQUE: Multidetector CT imaging of the chest was performed following the standard protocol without IV contrast.  COMPARISON:  08/18/2013  FINDINGS: Biapical pleural thickening noted. Tracheal diverticulum in the 7 o'clock position image 12 is reidentified. Areas of subpleural scarring are stable. 5 mm right upper lobe pulmonary nodule image 18 is stable. Other pulmonary nodules identified elsewhere are stable. Intrapulmonary lymph node abutting the minor fissure image 34 is stable. Left lower lobe calcified granuloma image 38 is stable.  Multilevel disc degenerative change noted. No compression deformity.  Thyroid nodularity is stable. Stable small pretracheal nodes. Assessment for central lymphadenopathy is  suboptimal due to lack of contrast. Stable 1 cm dominant pretracheal node image 26. Moderate atheromatous aortic calcification identified. Heart size is normal. No pleural effusion or pericardial effusion. Aortic ectasia at the level of the distal arch is stable measuring 3.5 cm image 21.  IMPRESSION: Stable right upper lobe pulmonary nodule for which followup was previously recommended due to nonvisualization on previous studies. Other previously evaluated nodules elsewhere are stable and do not require specific follow-up. Followup chest CT is recommended March 2016.   Electronically Signed   By: Conchita Paris M.D.   On: 02/12/2014 14:21  Ct Chest Wo Contrast  08/18/2013   CLINICAL DATA:  Followup of pulmonary nodules.  Ex-smoker.  EXAM: CT CHEST WITHOUT CONTRAST  TECHNIQUE: Multidetector CT imaging of the chest was performed following the standard protocol without IV contrast.  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  Mild centrilobular emphysema. Suspicion of either a soft tissue or a ground-glass opacity nodule developing around a branching pulmonary artery. On the order of 5 mm on image 18/series 4 and 6 mm on image 57 coronal. This is not readily apparent comparing back to 1/30 /13.  Favor scarring in the subpleural medial right apex on image 14, unchanged.  A 3 mm right upper lobe lung nodule on image 26/series 4 is similar back to 06/29/2011, consistent with a benign etiology.  Right middle lobe subpleural nodule measures 1.0 x 0.9 cm on image 36 and is similar back to 2013, consistent with a benign etiology.  Stable 3 mm lingular nodule on image 34/series 4. This was present back to the 2013 exam.  No pleural fluid.  Heart/Mediastinum: Low-density right-sided thyroid nodules which are incompletely  characterized but were grossly similar back to 2013. No mediastinal or definite hilar adenopathy, given limitations of unenhanced CT. Aortic and branch vessel atherosclerosis. Normal heart size with multivessel coronary artery atherosclerosis. No pericardial effusion.  Upper Abdomen:  Normal adrenal glands.  Bones/Musculoskeletal: Sclerosis involving the 6 posterior lateral left rib, likely posttraumatic.  IMPRESSION: 1. Similar appearance of right middle lobe, right upper lobe, and lingular nodules since 2013, consistent with a benign etiology. 2. 5-6 mm right upper lobe lung nodule. This is likely new when compared to remote prior exams. Given the smoking history, follow-up chest CT at 6-12 months is recommended. This recommendation follows the consensus statement: "Guidelines for Management of Small Pulmonary Nodules Detected on CT Scans: A Statement from the [place]" as published in Radiology2005;237:395-400. Online at: https://www.arnold.com/. 3.  Atherosclerosis, including within the coronary arteries.   Electronically Signed   By: Abigail Miyamoto M.D.   On: 08/18/2013 16:54     Ct Chest Low Dose Pilot W/o Cm  08/15/2012  *RADIOLOGY REPORT*  Clinical Data: History of smoking.  Pulmonary nodules noted on prior chest CT 02/15/2012.  Follow-up examination.  CT CHEST LOW DOSE PILOT WITHOUT CONTRAST  Technique: Multidetector CT imaging of the chest using the standard low-dose protocol without administration of intravenous contrast.  Comparison: Chest CT 02/15/2012.  Findings:  Mediastinum: Heart size is normal. Small amount of pericardial fluid and/or thickening is unlikely to be of any hemodynamic significance of this time.  No associated pericardial calcification. There is atherosclerosis of the thoracic aorta, the great vessels of the mediastinum and the coronary arteries, including calcified atherosclerotic plaque in the left main, left anterior descending, left  circumflex and right coronary arteries. Calcifications of the mitral annulus. No pathologically enlarged mediastinal or hilar lymph nodes. Please note that accurate exclusion of hilar adenopathy is limited on noncontrast CT scans. Esophagus is unremarkable in appearance.  Lungs/Pleura: Previously described 4 mm nodule in the left upper lobe is unchanged (image 122 of series 3) measuring only 4 mm on today's examination.  Previously noted right middle lobe nodule abutting the minor fissure is unchanged measuring 10 x 9 mm (image 138 of series 3).  No other definite new or enlarging suspicious appearing pulmonary nodules or masses are otherwise noted. Bilateral apical nodular areas of pleuroparenchymal thickening are unchanged compared to prior examinations and most compatible with areas of chronic scarring.  No acute consolidative airspace disease.  No pleural effusions.  There is a background of mild diffuse bronchial wall thickening with mild centrilobular and paraseptal emphysema.  Upper Abdomen: Unremarkable.  Musculoskeletal: There are no aggressive appearing lytic or blastic lesions noted in the visualized portions of the skeleton.  IMPRESSION: 1.  No change in previously noted 10 x 9 mm right middle lobe nodule or 4 mm nodule in the lingula when compared to remote prior examination from 06/29/2011.  Based on the behavior of these nodules, these are favored to be benign, however, continued attention on follow-up studies is recommended. At this time, a 1 year follow-up chest CT in March 2015 is recommended. 2.  Atherosclerosis, including left main and three-vessel coronary artery disease.  Assessment for potential risk factor modification, dietary therapy or pharmacologic therapy may be warranted, if clinically indicated.  3.  Mild diffuse bronchial wall thickening with mild centrilobular paraseptal emphysema.   Original Report Authenticated By: Vinnie Langton, M.D.       Recent Lab Findings: Lab Results    Component Value Date   WBC 5.5 08/01/2011   HGB 13.7 08/01/2011   HCT 39.4 08/01/2011   PLT 172 08/01/2011   GLUCOSE 97 08/01/2011   CHOL 195 10/01/2014   TRIG 63.0 10/01/2014   HDL 74.70 10/01/2014   LDLCALC 108* 10/01/2014   ALT 20 10/01/2014   AST 18 10/01/2014   NA 140 08/01/2011  K 3.5 08/01/2011   CL 103 08/01/2011   CREATININE 0.60 08/01/2011   BUN 13 08/01/2011   CO2 29 08/01/2011   INR 0.98 08/01/2011      Assessment / Plan:    Multiple solid and part solid nodules are again identified within both lungs. Within the right upper lobe there is a part solid nodule which has increased in size from previous exam.   Generally similar appearance of hypermetabolic thoracic adenopathy and scattered hypermetabolic pulmonary nodules, compared to the 2013 prior PET-CT. There is new right upper lobe hypermetabolic nodule since that time, but also interval resolution of a right middle lobe hypermetabolic nodule since that time. The patient has had waxing and waning pulmonary nodules bilaterally. Some have resolved there is a right upper lobe nodule which is increased in size. I discussed with patient proceeding with biopsy now versus observation. She prefers to wait. We'll plan on a super D CT scan of the chest in 3-4 months and if the right lung lesion is the same or has enlarged we'll proceed with navigation bronchoscopy and attempted biopsy.  The patient has a new right brain lesion suspected on PET scan, we will obtain a new MRI of the brain to compare to the one previously done in 2013.  Atherosclerosis, including left main and three-vessel coronary artery disease, incidental noted on CT.  Known CAD with previous stent RCA, Followed by Dr Jacelyn Grip MD  [facility] [phone] Office [phone] 04/13/2015 3:44 PM

## 2015-04-15 ENCOUNTER — Encounter: Payer: Self-pay | Admitting: *Deleted

## 2015-04-29 ENCOUNTER — Ambulatory Visit (HOSPITAL_COMMUNITY)
Admission: RE | Admit: 2015-04-29 | Discharge: 2015-04-29 | Disposition: A | Discharge status: Home / Self Care | Payer: Medicare Other | Source: Ambulatory Visit | Attending: Cardiothoracic Surgery | Admitting: Cardiothoracic Surgery

## 2015-04-29 DIAGNOSIS — I6782 Cerebral ischemia: Secondary | ICD-10-CM | POA: Diagnosis not present

## 2015-04-29 DIAGNOSIS — G939 Disorder of brain, unspecified: Secondary | ICD-10-CM

## 2015-04-29 LAB — POCT I-STAT CREATININE: Creatinine, Ser: 0.8 mg/dL (ref 0.44–1.00)

## 2015-04-29 MED ORDER — GADOBENATE DIMEGLUMINE 529 MG/ML IV SOLN
15.0000 mL | Freq: Once | INTRAVENOUS | Status: AC | PRN
  Administered 2015-04-29: 14 mL via INTRAVENOUS

## 2015-05-03 ENCOUNTER — Encounter: Payer: Self-pay | Admitting: Cardiothoracic Surgery

## 2015-05-03 ENCOUNTER — Ambulatory Visit (INDEPENDENT_AMBULATORY_CARE_PROVIDER_SITE_OTHER): Payer: Medicare Other | Admitting: Cardiothoracic Surgery

## 2015-05-03 VITALS — BP 158/86 | HR 50 | Resp 20 | Ht 65.0 in | Wt 155.0 lb

## 2015-05-03 DIAGNOSIS — R918 Other nonspecific abnormal finding of lung field: Secondary | ICD-10-CM

## 2015-05-03 NOTE — Progress Notes (Signed)
[address]       [place] [address]             [phone]         Margaret Hale [place] Medical Record #[id number] Date of Birth: March 05, 1935  Referring: Margaret Stains, MD Primary Care: Margaret Schwalbe, MD  Chief Complaint:    Chief Complaint  Patient presents with  . Lung Lesion    discuss results of MRI HEAD WITHOUT AND WITH CONTRAST 04/29/15    History of Present Illness:    She had a previous biopsy of the left upper lobe lesion that was showed though some reactive changes and atypical cyst and some questionable atypical cells. Followup low-dose CT scan today showed that the lesions had not changed. The left upper lobe lesion was still 16 mm in the right upper lobe lesion was 10 mm. Her mediastinoscopy was negative and it showed granulomatous disease. The patient was seen by Dr [name] in May 2013 and was  not interested in any further biopsies or removal at the present time. Dr [name]  did explain to her that if  these lesions get bigger than resection will need to be done. She has had serial ct scans since that time. The left upper lobe lesion resolved. Recent CT scan done last week showed resolution of some lesions and increasing size of a right upper lobe lesion.  She retuns today with follow PET scan..  No anginal type pains, even with daily exercise denies angina. She denies cough hemoptysis or recurrent pulmonary infections. She stay off cigarettes since 2007 She does note some early morning sputum production.   Current Activity/ Functional Status: Patient is independent with mobility/ambulation, transfers, ADL's, IADL's.   Past Medical History  Diagnosis Date  . Hyperlipidemia   . CAD (coronary artery disease) 2007    PCI of RCA, bare metal stent.....Dr. Radford Pax  . Thyroid disease     nodules,stable on u/s 2/12  . Baker's cyst     L KNEE  . Vitamin D deficiency   . Disc disorder of lumbar region     2 BULDGING DISCS DX 1990 BY DR.  DALLDORF  . No pertinent past medical history     lung mass  occas cough   . Dizzy   . Hypertension     Takes Novasc ,Clonidine, & Losartan  . Cancer ([place])     lung mass    Past Surgical History  Procedure Laterality Date  . Carpal tunnel release  06/26/08        04/20/09    RIGHT HAND          LEFT HAND  . Finger contracture release  8/12    Dr. Amedeo Plenty    right hand trigger finger release  . Eye surgery  01/07/09    LEFT CATARACT  . Eye surgery  01/21/09    RIGHT CATARACT  . Coronary angioplasty      dr h Tamala Julian [phone] with one stent  . Mediastinoscopy  08/02/2011    Procedure: MEDIASTINOSCOPY;  Surgeon: Nicanor Alcon, MD;  Location: [facility] OR;  Service: Thoracic;  Laterality: N/A;    Family History  Problem Relation Age of Onset  . Anesthesia problems Neg Hx   . Hypotension Neg Hx   . Malignant hyperthermia Neg Hx   . Pseudochol deficiency Neg Hx     Social History   Social History  . Marital Status: Widowed    Spouse  Name: N/A  . Number of Children: 2  . Years of Education: N/A   Occupational History  . Retired    Social History Main Topics  . Smoking status: Former Smoker -- 0.50 packs/day for 10 years    Types: Cigarettes    Quit date: 07/12/2005  . Smokeless tobacco: Never Used  . Alcohol Use: No  . Drug Use: No  . Sexual Activity: Not on file   Other Topics Concern  . Not on file   Social History Narrative    History  Smoking status  . Former Smoker -- 0.50 packs/day for 10 years  . Types: Cigarettes  . Quit date: 07/12/2005  Smokeless tobacco  . Never Used    History  Alcohol Use No     Allergies  Allergen Reactions  . Food     Sausage, hot dogs, salami (rash)  . Lipitor [Atorvastatin Calcium] Other (See Comments)    MYALGIAS  . Pantoprazole Sodium Rash    Current Outpatient Prescriptions  Medication Sig Dispense Refill  . amLODipine (NORVASC) 10 MG tablet Take 10 mg by mouth every evening.     Marland Kitchen aspirin 81 MG tablet Take 81 mg by  mouth daily.    . Calcium 1500 MG tablet Take 1,500 mg by mouth daily.    . cloNIDine (CATAPRES) 0.2 MG tablet Take 1 tablet (0.2 mg total) by mouth 2 (two) times daily. 60 tablet 6  . ezetimibe (ZETIA) 10 MG tablet Take 1 tablet (10 mg total) by mouth daily. 30 tablet 4  . losartan (COZAAR) 100 MG tablet Take 100 mg by mouth daily.    . Omega-3 Fatty Acids (FISH OIL PO) Take 1 capsule by mouth daily.    Marland Kitchen rOPINIRole (REQUIP) 0.5 MG tablet Take 1 tablet (0.5 mg total) by mouth at bedtime. 30 tablet 6  . triamcinolone cream (KENALOG) 0.1 %     . venlafaxine (EFFEXOR-XR) 37.5 MG 24 hr capsule Take 37.5 mg by mouth daily.     No current facility-administered medications for this visit.       Review of Systems:     Cardiac Review of Systems: Y or N  Chest Pain [  n  ]  Resting SOB [n   ] Exertional SOB  [ n ]  Orthopnea [n  ]   Pedal Edema [ n  ]    Palpitations [ n ] Syncope  [n  ]   Presyncope [ n  ]  General Review of Systems: [Y] = yes [  ]=no Constitional: recent weight change [  ]; anorexia [  ]; fatigue [ n ]; nausea [n  ]; night sweats [ n ]; fever [ n ]; or chills [n  ];  Dental: poor dentition[n  ];   Eye : blurred vision [  ]; diplopia [   ]; vision changes [  ];  Amaurosis fugax[  ]; Resp: cough [ y ];  wheezing[ n ];  hemoptysis[n  ]; shortness of breath[n ]; paroxysmal nocturnal dyspnea[ n]; dyspnea on exertion[  ]; or orthopnea[  ];  GI:  gallstones[  ], vomiting[  ];  dysphagia[  ]; melena[  ];  hematochezia [  ]; heartburn[  ];   Hx of  Colonoscopy[ yes last year ]; GU: kidney stones [  ]; hematuria[  ];   dysuria [  ];  nocturia[  ];  history of     obstruction [  ];             Skin: rash, swelling[  ];, hair loss[  ];  peripheral edema[  ];  or itching[  ]; Musculosketetal: myalgias[  ];  joint swelling[  ];  joint erythema[  ];  joint  pain[  ];  back pain[  ];  Heme/Lymph: bruising[  ];  bleeding[  ];  anemia[  ];  Neuro: TIA[  ];  headaches[  ];  stroke[no history of stroke  ];  vertigo[  ];  seizures[  ];   paresthesias[  ];  difficulty walking[  ]; restless legs at night better on medsy  Psych:depression[  ]; anxiety[  ];  Endocrine: diabetes[  ];  thyroid dysfunction[  ];  Immunizations: Flu [ yes ]; Pneumococcal[unknown  ];  Other:  Physical Exam: BP 158/86 mmHg  Pulse 50  Resp 20  Ht '5\' 5"'$  (1.651 m)  Wt 155 lb (70.308 kg)  BMI 25.79 kg/m2  SpO2 98%  General appearance: alert, cooperative, appears stated age and no distress Neurologic: intact Heart: regular rate and rhythm, S1, S2 normal, no murmur, click, rub or gallop and normal apical impulse Lungs: clear to auscultation bilaterally and normal percussion bilaterally Abdomen: soft, non-tender; bowel sounds normal; no masses,  no organomegaly Extremities: extremities normal, atraumatic, no cyanosis or edema, Homans sign is negative, no sign of DVT and no edema, redness or tenderness in the calves or thighs no cervicall or axillary adenopathy No carotid bruits  Diagnostic Studies & Laboratory data:     Recent Radiology Findings: .Nm Pet Image Initial (pi) Skull Base To Thigh  04/07/2015  CLINICAL DATA:  Initial treatment strategy for lung nodules. EXAM: NUCLEAR MEDICINE PET SKULL BASE TO THIGH TECHNIQUE: 7.7 mCi F-18 FDG was injected intravenously. Full-ring PET imaging was performed from the skull base to thigh after the radiotracer. CT data was obtained and used for attenuation correction and anatomic localization. FASTING BLOOD GLUCOSE:  Value: 112 mg/dl COMPARISON:  03/24/2015 ; 07/07/2011 FINDINGS: NECK Abnormal hypodense lesion inferiorly in the right temporal lobe with associated hypometabolism. This lesion is only partially included. Visualized hypodense portion measures 4.2 by 3.1 cm on image 1 of series 4. Symmetric physiologic salivary and glottic  activity. Right thyroid nodules are similar to prior exams and not hypermetabolic. CHEST Hypermetabolic paratracheal, bilateral hilar, and AP window lymph nodes are present along with a hypermetabolic right infrahilar lymph node. An index paratracheal node measures 7 mm on thickness on image 55 series 4 with maximum standard uptake value 8.3. This node was previously not appreciably hypermetabolic. A right hilar node is difficult to measure in size but has a maximum standard uptake value of 16.3 (formerly 11.5 Bilateral pulmonary nodules are present. A right middle lobe nodule measuring 0.9 by 1.2  cm on image 40 of series 6 (previously the same size on 10/11/2011) has a maximum standard uptake value of 6.4 (previously 5.2 back on 07/07/2011). A previous left upper lobe nodule from 10/11/2011 which was hypermetabolic has resolved in the antrum. Other hypermetabolic nodules are present. The prior hypermetabolic left rib lesion is no longer hypermetabolic and has healed. A 1.6 by 1.4 cm right upper lobe nodule with solid and sub solid components has maximum standard uptake value 4.2, measured on image 22 series 6. This nodule was not present in 2013. Dense mitral calcification. Coronary, aortic arch, and branch vessel atherosclerotic vascular disease. ABDOMEN/PELVIS Dense calcifications in the right lower quadrant are extraluminal and probably vascular. No associated hypermetabolic activity. Aortoiliac atherosclerotic vascular disease. SKELETON No focal hypermetabolic activity to suggest skeletal metastasis. IMPRESSION: 1. Generally similar appearance of hypermetabolic thoracic adenopathy and scattered hypermetabolic pulmonary nodules, compared to the 2013 prior PET-CT. There is new right upper lobe hypermetabolic nodule since that time, but also interval resolution of a right middle lobe hypermetabolic nodule since that time. Looking back, some of these nodules have been present since 2007, along with some of the  thoracic adenopathy. Suspicion is those raised for sarcoid or similar granulomatous process. It is difficult to exclude a new synchronous right upper lobe lung cancer, but the hypermetabolic mixed density nodule could easily be due to presumed underlying granulomatous process. Biopsy or observation suggested. 2. There is a new 4.2 by 3.1 cm hypodense and hypometabolic lesion in the right temporal lobe. If the patient has had a stroke over the past 3 years than that might explain this. Otherwise, this could represent a cystic mass or other lesion, and I would recommend MRI brain with and without contrast. 3. Multinodular goiter primarily involving the right lobe. 4. Coronary, aortic arch, and branch vessel atherosclerotic vascular disease. Dense mitral valve calcification. Electronically Signed   By: Van Clines M.D.   On: 04/07/2015 15:23    Ct Chest Wo Contrast  03/24/2015  CLINICAL DATA:  Followup lung nodules EXAM: CT CHEST WITHOUT CONTRAST TECHNIQUE: Multidetector CT imaging of the chest was performed following the standard protocol without IV contrast. COMPARISON:  02/12/2014 FINDINGS: Mediastinum: The heart size appears within normal limits. No pericardial effusion identified. Calcified atherosclerotic disease involves the thoracic aorta as well as the RCA, LAD and left circumflex coronary artery. 9 mm right paratracheal lymph node is stable. There is a calcified sub- carinal lymph node noted. Lungs/Pleura: The right upper lobe part solid nodule measures 1.5 cm and has a central solid component which measures 6 mm. On the previous exam this nodule measured 1.2 cm. Solid right upper lobe lung nodule is stable measuring 3 mm, image 26/series 4. Small ground-glass attenuating nodule within the right upper lobe measures 5 mm, image 30 of series 4. Previously 3 mm. Solid right middle lobe lung nodule measures 11 mm and is unchanged from previous exam, image 34 of series 4. Stable left upper lobe  ground-glass attenuating nodule measuring 7 mm, image 31 of series 4. Stable solid nodule within the left upper lobe measuring 4 mm. Upper Abdomen: There is no focal liver abnormality identified. The adrenal glands are within normal limits. Normal appearance of the spleen. The visualized portions of the pancreas and left kidney are normal. Musculoskeletal: Spondylosis noted within the thoracic spine. No aggressive lytic or sclerotic bone lesions. IMPRESSION: 1. Multiple solid and part solid nodules are again identified within both lungs. Within the right upper lobe there is a  part solid nodule which has increased in size from previous exam. This now measures 1.5 cm and has a central solid component of 6 mm. 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 Biopsy or surgical resection of this nodule is recommended. 2. The other small nodules are unchanged. 3. Aortic atherosclerosis as well as 3 vessel coronary artery calcification. Electronically Signed   By: Kerby Moors M.D.   On: 03/24/2015 15:53   Ct Chest Wo Contrast  02/12/2014   CLINICAL DATA:  Followup right upper lobe pulmonary nodule. Other nodules have been stable for more than 2 years, compatible with a benign etiology.  EXAM: CT CHEST WITHOUT CONTRAST  TECHNIQUE: Multidetector CT imaging of the chest was performed following the standard protocol without IV contrast.  COMPARISON:  08/18/2013  FINDINGS: Biapical pleural thickening noted. Tracheal diverticulum in the 7 o'clock position image 12 is reidentified. Areas of subpleural scarring are stable. 5 mm right upper lobe pulmonary nodule image 18 is stable. Other pulmonary nodules identified elsewhere are stable. Intrapulmonary lymph node abutting the minor fissure image 34 is stable. Left lower lobe calcified granuloma image 38 is stable.  Multilevel disc degenerative change noted. No compression deformity.  Thyroid nodularity is stable. Stable small pretracheal nodes. Assessment for  central lymphadenopathy is suboptimal due to lack of contrast. Stable 1 cm dominant pretracheal node image 26. Moderate atheromatous aortic calcification identified. Heart size is normal. No pleural effusion or pericardial effusion. Aortic ectasia at the level of the distal arch is stable measuring 3.5 cm image 21.  IMPRESSION: Stable right upper lobe pulmonary nodule for which followup was previously recommended due to nonvisualization on previous studies. Other previously evaluated nodules elsewhere are stable and do not require specific follow-up. Followup chest CT is recommended March 2016.   Electronically Signed   By: Conchita Paris M.D.   On: 02/12/2014 14:21  Ct Chest Wo Contrast  08/18/2013   CLINICAL DATA:  Followup of pulmonary nodules.  Ex-smoker.  EXAM: CT CHEST WITHOUT CONTRAST  TECHNIQUE: Multidetector CT imaging of the chest was performed following the standard protocol without IV contrast.  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  Mild centrilobular emphysema. Suspicion of either a soft tissue or a ground-glass opacity nodule developing around a branching pulmonary artery. On the order of 5 mm on image 18/series 4 and 6 mm on image 57 coronal. This is not readily apparent comparing back to 1/30 /13.  Favor scarring in the subpleural medial right apex on image 14, unchanged.  A 3 mm right upper lobe lung nodule on image 26/series 4 is similar back to 06/29/2011, consistent with a benign etiology.  Right middle lobe subpleural nodule measures 1.0 x 0.9 cm on image 36 and is similar back to 2013, consistent with a benign etiology.  Stable 3 mm lingular nodule on image 34/series 4. This was present back to the 2013 exam.  No pleural fluid.  Heart/Mediastinum: Low-density right-sided thyroid nodules  which are incompletely characterized but were grossly similar back to 2013. No mediastinal or definite hilar adenopathy, given limitations of unenhanced CT. Aortic and branch vessel atherosclerosis. Normal heart size with multivessel coronary artery atherosclerosis. No pericardial effusion.  Upper Abdomen:  Normal adrenal glands.  Bones/Musculoskeletal: Sclerosis involving the 6 posterior lateral left rib, likely posttraumatic.  IMPRESSION: 1. Similar appearance of right middle lobe, right upper lobe, and lingular nodules since 2013, consistent with a benign etiology. 2. 5-6 mm right upper lobe lung nodule. This is likely new when compared to remote prior exams. Given the smoking history, follow-up chest CT at 6-12 months is recommended. This recommendation follows the consensus statement: "Guidelines for Management of Small Pulmonary Nodules Detected on CT Scans: A Statement from the [place]" as published in Radiology2005;237:395-400. Online at: https://www.arnold.com/. 3.  Atherosclerosis, including within the coronary arteries.   Electronically Signed   By: Abigail Miyamoto M.D.   On: 08/18/2013 16:54     Ct Chest Low Dose Pilot W/o Cm  08/15/2012  *RADIOLOGY REPORT*  Clinical Data: History of smoking.  Pulmonary nodules noted on prior chest CT 02/15/2012.  Follow-up examination.  CT CHEST LOW DOSE PILOT WITHOUT CONTRAST  Technique: Multidetector CT imaging of the chest using the standard low-dose protocol without administration of intravenous contrast.  Comparison: Chest CT 02/15/2012.  Findings:  Mediastinum: Heart size is normal. Small amount of pericardial fluid and/or thickening is unlikely to be of any hemodynamic significance of this time.  No associated pericardial calcification. There is atherosclerosis of the thoracic aorta, the great vessels of the mediastinum and the coronary arteries, including calcified atherosclerotic plaque in the left main, left anterior  descending, left circumflex and right coronary arteries. Calcifications of the mitral annulus. No pathologically enlarged mediastinal or hilar lymph nodes. Please note that accurate exclusion of hilar adenopathy is limited on noncontrast CT scans. Esophagus is unremarkable in appearance.  Lungs/Pleura: Previously described 4 mm nodule in the left upper lobe is unchanged (image 122 of series 3) measuring only 4 mm on today's examination.  Previously noted right middle lobe nodule abutting the minor fissure is unchanged measuring 10 x 9 mm (image 138 of series 3).  No other definite new or enlarging suspicious appearing pulmonary nodules or masses are otherwise noted. Bilateral apical nodular areas of pleuroparenchymal thickening are unchanged compared to prior examinations and most compatible with areas of chronic scarring.  No acute consolidative airspace disease.  No pleural effusions.  There is a background of mild diffuse bronchial wall thickening with mild centrilobular and paraseptal emphysema.  Upper Abdomen: Unremarkable.  Musculoskeletal: There are no aggressive appearing lytic or blastic lesions noted in the visualized portions of the skeleton.  IMPRESSION: 1.  No change in previously noted 10 x 9 mm right middle lobe nodule or 4 mm nodule in the lingula when compared to remote prior examination from 06/29/2011.  Based on the behavior of these nodules, these are favored to be benign, however, continued attention on follow-up studies is recommended. At this time, a 1 year follow-up chest CT in March 2015 is recommended. 2.  Atherosclerosis, including left main and three-vessel coronary artery disease.  Assessment for potential risk factor modification, dietary therapy or pharmacologic therapy may be warranted, if clinically indicated.  3.  Mild diffuse bronchial wall thickening with mild centrilobular paraseptal emphysema.   Original Report Authenticated By: Vinnie Langton, M.D.     Mr Jeri Cos Wo  Contrast  04/29/2015  CLINICAL DATA:  Right temporal lobe lesion on recent PET-CT. EXAM: MRI HEAD WITHOUT AND WITH CONTRAST TECHNIQUE: Multiplanar, multiecho pulse sequences of the brain and surrounding structures were obtained without and with intravenous contrast. CONTRAST:  68m MULTIHANCE GADOBENATE DIMEGLUMINE 529 MG/ML IV SOLN COMPARISON:  PET-CT 04/07/2015.  Brain MRI 07/21/2011. FINDINGS: There is no evidence of acute infarct, intracranial hemorrhage, or  extra-axial fluid collection. Mild-to-moderate global cerebral atrophy is again seen. Patchy to confluent T2 hyperintensities are again seen in the periventricular greater than subcortical cerebral white matter bilaterally, compatible with moderate chronic small vessel ischemic disease. As seen on recent PET-CT, there is an intra-axial mass in the right temporal lobe which is new from the prior MRI. The mass measures 5.6 x 4.1 x 3.9 cm and is largely cystic with thin peripheral enhancement. However, there are 2 enhancing mural soft tissue nodules. The largest measures 1.9 x 1.0 cm along the anterosuperior aspect of the lesion, while the smaller measures 8 mm along the inferolateral aspect. There is very mild surrounding edema which slightly extends into the internal and external capsules. Minimal leftward midline shift measures 2-3 mm. No other enhancing brain lesions are seen. Prior bilateral cataract extraction is noted. The paranasal sinuses are clear. There is a trace right mastoid effusion which is chronic. Major intracranial vascular flow voids are preserved. IMPRESSION: 1. 5.6 cm cystic mass with mural nodules in the right temporal lobe with very mild surrounding edema. Primary considerations include low-grade primary CNS neoplasm and cystic metastasis, with the former favored. 2. Moderate chronic small vessel ischemic disease. Electronically Signed   By: Logan Bores M.D.   On: 04/29/2015 16:40    Recent Lab Findings: Lab Results  Component  Value Date   WBC 5.5 08/01/2011   HGB 13.7 08/01/2011   HCT 39.4 08/01/2011   PLT 172 08/01/2011   GLUCOSE 97 08/01/2011   CHOL 195 10/01/2014   TRIG 63.0 10/01/2014   HDL 74.70 10/01/2014   LDLCALC 108* 10/01/2014   ALT 20 10/01/2014   AST 18 10/01/2014   NA 140 08/01/2011   K 3.5 08/01/2011   CL 103 08/01/2011   CREATININE 0.80 04/29/2015   BUN 13 08/01/2011   CO2 29 08/01/2011   INR 0.98 08/01/2011      Assessment / Plan:    Multiple solid and part solid nodules are again identified within both lungs. Within the right upper lobe there is a part solid nodule which has increased in size from previous exam.   Generally similar appearance of hypermetabolic thoracic adenopathy and scattered hypermetabolic pulmonary nodules, compared to the 2013 prior PET-CT. There is new right upper lobe hypermetabolic nodule since that time, but also interval resolution of a right middle lobe hypermetabolic nodule since that time. The patient has had waxing and waning pulmonary nodules bilaterally. Some have resolved there is a right upper lobe nodule which is increased in size. I discussed with patient proceeding with biopsy now versus observation. She prefers to wait. We'll plan on a super D CT scan of the chest in 3-4 months and if the right lung lesion is the same or has enlarged we'll proceed with navigation bronchoscopy and attempted biopsy.  The patient has a new right brain lesion suspected on PET scan, and  new  MRI of the brain to compare to the one previously done in 2013.  5.6 cm cystic mass with mural nodules in the right temporal lobe with very mild surrounding edema  Atherosclerosis, including left main and three-vessel coronary artery disease, incidental noted on CT.  Known CAD with previous stent RCA, Followed by Dr Radford Pax    The patient is asymptomatic from the right brain lesion, she has been referred to neurosurgery urgently for consultation and attempt of to obtain a diagnosis,  her previous nodal disease in 2013 a chest was benign.  I'll plan to see her  back in 4 weeks  Grace Isaac MD  Beeper [phone] Office [phone] 05/03/2015 4:13 PM

## 2015-05-12 ENCOUNTER — Other Ambulatory Visit: Payer: Self-pay | Admitting: Neurosurgery

## 2015-05-12 ENCOUNTER — Telehealth: Payer: Self-pay | Admitting: Cardiology

## 2015-05-12 DIAGNOSIS — Z01818 Encounter for other preprocedural examination: Secondary | ICD-10-CM

## 2015-05-12 NOTE — Telephone Encounter (Signed)
F/u    Pt wanting to know concerning clearance

## 2015-05-12 NOTE — Telephone Encounter (Signed)
Per Dr. Radford Pax, called patient to schedule ECHO prior to clearance. Patient st she has been asymptomatic - no CP or jaw pain.  Patient understands she will be called to schedule ECHO.  Left message for Margaret Hale at [facility] that ECHO will have to be done prior to surgery. Instructed her to call back for any questions or concerns.

## 2015-05-12 NOTE — Telephone Encounter (Signed)
New message     Fax clearnace sent on 12.12.2016 . Checking on the status    Request for surgical clearance:  1. What type of surgery is being performed? Brain tumor   2. When is this surgery scheduled? Pending   3. Are there any medications that need to be held prior to surgery and how long? Asa - need to be stop    4. Name of physician performing surgery? Dr. Arnoldo Morale   5. What is your office phone and fax number? [phone] / ext [phone]  6. fax [phone]

## 2015-05-14 ENCOUNTER — Ambulatory Visit (HOSPITAL_COMMUNITY)
Admission: RE | Admit: 2015-05-14 | Discharge: 2015-05-14 | Disposition: A | Discharge status: Home / Self Care | Payer: Medicare Other | Source: Ambulatory Visit | Attending: Cardiology | Admitting: Cardiology

## 2015-05-14 DIAGNOSIS — Z01818 Encounter for other preprocedural examination: Secondary | ICD-10-CM | POA: Diagnosis not present

## 2015-05-14 DIAGNOSIS — I2510 Atherosclerotic heart disease of native coronary artery without angina pectoris: Secondary | ICD-10-CM | POA: Insufficient documentation

## 2015-05-14 DIAGNOSIS — I519 Heart disease, unspecified: Secondary | ICD-10-CM | POA: Insufficient documentation

## 2015-05-14 DIAGNOSIS — E785 Hyperlipidemia, unspecified: Secondary | ICD-10-CM | POA: Insufficient documentation

## 2015-05-14 DIAGNOSIS — R079 Chest pain, unspecified: Secondary | ICD-10-CM | POA: Insufficient documentation

## 2015-05-14 DIAGNOSIS — J449 Chronic obstructive pulmonary disease, unspecified: Secondary | ICD-10-CM | POA: Diagnosis not present

## 2015-05-14 DIAGNOSIS — I10 Essential (primary) hypertension: Secondary | ICD-10-CM

## 2015-05-14 NOTE — Progress Notes (Signed)
Echocardiogram 2D Echocardiogram has been performed.  Margaret Hale 05/14/2015, 12:10 PM

## 2015-05-17 NOTE — Telephone Encounter (Signed)
Clearance (see ECHO results) printed and faxed to [facility].

## 2015-06-02 ENCOUNTER — Inpatient Hospital Stay (HOSPITAL_COMMUNITY): Admission: RE | Admit: 2015-06-02 | Payer: Medicare Other | Source: Ambulatory Visit

## 2015-06-03 ENCOUNTER — Ambulatory Visit (INDEPENDENT_AMBULATORY_CARE_PROVIDER_SITE_OTHER): Payer: Medicare Other | Admitting: Cardiothoracic Surgery

## 2015-06-03 ENCOUNTER — Encounter: Payer: Self-pay | Admitting: Cardiothoracic Surgery

## 2015-06-03 VITALS — BP 155/70 | HR 56 | Resp 20 | Ht 65.0 in | Wt 166.0 lb

## 2015-06-03 DIAGNOSIS — D496 Neoplasm of unspecified behavior of brain: Secondary | ICD-10-CM | POA: Diagnosis not present

## 2015-06-03 DIAGNOSIS — R918 Other nonspecific abnormal finding of lung field: Secondary | ICD-10-CM | POA: Diagnosis not present

## 2015-06-03 DIAGNOSIS — I25119 Atherosclerotic heart disease of native coronary artery with unspecified angina pectoris: Secondary | ICD-10-CM

## 2015-06-03 DIAGNOSIS — G939 Disorder of brain, unspecified: Secondary | ICD-10-CM

## 2015-06-03 DIAGNOSIS — G9389 Other specified disorders of brain: Secondary | ICD-10-CM

## 2015-06-03 NOTE — Progress Notes (Signed)
[address]       [place] [address]             [phone]         Mariska Balon [facility] Medical Record #[id number] Date of Birth: Jul 18, 1934  Referring: [name], MD Primary Care: [name], MD  Chief Complaint:    Chief Complaint  Patient presents with  . Lung Lesion    4 week f/u, EHCO 1216/16, saw Dr Arnoldo Morale 05/05/15, sch'ed for CRANIOTOMY TUMOR EXCISION  06/09/15    History of Present Illness:    She had a previous biopsy of the left upper lobe lesion that was showed though some reactive changes and atypical cyst and some questionable atypical cells. Followup low-dose CT scan today showed that the lesions had not changed. The left upper lobe lesion was still 16 mm in the right upper lobe lesion was 10 mm. Her mediastinoscopy was negative and it showed granulomatous disease. The patient was seen by Dr [name] in May 2013 and was  not interested in any further biopsies or removal at the present time. Dr [name]  did explain to her that if  these lesions get bigger than resection will need to be done. She has had serial ct scans since that time. The left upper lobe lesion resolved. Recent CT scan done last week showed resolution of some lesions and increasing size of a right upper lobe lesion.   No anginal type pains, even with daily exercise denies angina. She denies cough hemoptysis or recurrent pulmonary infections. She stay off cigarettes since 2007 She does note some early morning sputum production.  Patient has been seen by neurosurgery and is planning to proceed with resection of brain lesion right temporal lobe.   Current Activity/ Functional Status: Patient is independent with mobility/ambulation, transfers, ADL's, IADL's.   Past Medical History  Diagnosis Date  . Hyperlipidemia   . CAD (coronary artery disease) 2007    PCI of RCA, bare metal stent.....Dr. Radford Pax  . Thyroid disease     nodules,stable on u/s 2/12  . Baker's  cyst     L KNEE  . Vitamin D deficiency   . Disc disorder of lumbar region     2 BULDGING DISCS DX 1990 BY DR. DALLDORF  . No pertinent past medical history     lung mass  occas cough   . Dizzy   . Hypertension     Takes Novasc ,Clonidine, & Losartan  . Cancer ([place])     lung mass    Past Surgical History  Procedure Laterality Date  . Carpal tunnel release  06/26/08        04/20/09    RIGHT HAND          LEFT HAND  . Finger contracture release  8/12    Dr. Amedeo Plenty    right hand trigger finger release  . Eye surgery  01/07/09    LEFT CATARACT  . Eye surgery  01/21/09    RIGHT CATARACT  . Coronary angioplasty      dr h Tamala Julian [phone] with one stent  . Mediastinoscopy  08/02/2011    Procedure: MEDIASTINOSCOPY;  Surgeon: Nicanor Alcon, MD;  Location: [facility] OR;  Service: Thoracic;  Laterality: N/A;    Family History  Problem Relation Age of Onset  . Anesthesia problems Neg Hx   . Hypotension Neg Hx   . Malignant hyperthermia Neg Hx   . Pseudochol deficiency Neg Hx  Social History   Social History  . Marital Status: Widowed    Spouse Name: N/A  . Number of Children: 2  . Years of Education: N/A   Occupational History  . Retired    Social History Main Topics  . Smoking status: Former Smoker -- 0.50 packs/day for 10 years    Types: Cigarettes    Quit date: 07/12/2005  . Smokeless tobacco: Never Used  . Alcohol Use: No  . Drug Use: No  . Sexual Activity: Not on file   Other Topics Concern  . Not on file   Social History Narrative    History  Smoking status  . Former Smoker -- 0.50 packs/day for 10 years  . Types: Cigarettes  . Quit date: 07/12/2005  Smokeless tobacco  . Never Used    History  Alcohol Use No     Allergies  Allergen Reactions  . Food     Sausage, hot dogs, salami (rash)  . Lipitor [Atorvastatin Calcium] Other (See Comments)    MYALGIAS  . Pantoprazole Sodium Rash    Current Outpatient Prescriptions  Medication Sig Dispense Refill   . amLODipine (NORVASC) 10 MG tablet Take 10 mg by mouth every evening.     Marland Kitchen aspirin 81 MG tablet Take 81 mg by mouth daily.    . Calcium 1500 MG tablet Take 1,500 mg by mouth daily.    . cloNIDine (CATAPRES) 0.2 MG tablet Take 1 tablet (0.2 mg total) by mouth 2 (two) times daily. 60 tablet 6  . losartan (COZAAR) 100 MG tablet Take 100 mg by mouth daily.    . Omega-3 Fatty Acids (FISH OIL PO) Take 1 capsule by mouth daily.    Marland Kitchen rOPINIRole (REQUIP) 0.5 MG tablet Take 1 tablet (0.5 mg total) by mouth at bedtime. 30 tablet 6  . triamcinolone cream (KENALOG) 0.1 %     . venlafaxine (EFFEXOR-XR) 37.5 MG 24 hr capsule Take 37.5 mg by mouth daily.     No current facility-administered medications for this visit.       Review of Systems:     Cardiac Review of Systems: Y or N  Chest Pain [  n  ]  Resting SOB [n   ] Exertional SOB  [ n ]  Orthopnea [n  ]   Pedal Edema [ n  ]    Palpitations [ n ] Syncope  [n  ]   Presyncope [ n  ]  General Review of Systems: [Y] = yes [  ]=no Constitional: recent weight change [  ]; anorexia [  ]; fatigue [ n ]; nausea [n  ]; night sweats [ n ]; fever [ n ]; or chills [n  ];  Dental: poor dentition[n  ];   Eye : blurred vision [  ]; diplopia [   ]; vision changes [  ];  Amaurosis fugax[  ]; Resp: cough [ y ];  wheezing[ n ];  hemoptysis[n  ]; shortness of breath[n ]; paroxysmal nocturnal dyspnea[ n]; dyspnea on exertion[  ]; or orthopnea[  ];  GI:  gallstones[  ], vomiting[  ];  dysphagia[  ]; melena[  ];  hematochezia [  ]; heartburn[  ];   Hx of  Colonoscopy[ yes last year ]; GU: kidney stones [  ]; hematuria[  ];   dysuria [  ];  nocturia[  ];  history of     obstruction [  ];             Skin: rash, swelling[  ];, hair loss[  ];  peripheral edema[  ];  or itching[  ]; Musculosketetal: myalgias[  ];  joint swelling[  ];  joint  erythema[  ];  joint pain[  ];  back pain[  ];  Heme/Lymph: bruising[  ];  bleeding[  ];  anemia[  ];  Neuro: TIA[  ];  headaches[  ];  stroke[no history of stroke  ];  vertigo[  ];  seizures[  ];   paresthesias[  ];  difficulty walking[  ]; restless legs at night better on medsy  Psych:depression[  ]; anxiety[  ];  Endocrine: diabetes[  ];  thyroid dysfunction[  ];  Immunizations: Flu [ yes ]; Pneumococcal[unknown  ];  Other:  Physical Exam: BP 155/70 mmHg  Pulse 56  Resp 20  Ht '5\' 5"'$  (1.651 m)  Wt 166 lb (75.297 kg)  BMI 27.62 kg/m2  SpO2 98%  General appearance: alert, cooperative, appears stated age and no distress Neurologic: intact Heart: regular rate and rhythm, S1, S2 normal, no murmur, click, rub or gallop and normal apical impulse Lungs: clear to auscultation bilaterally and normal percussion bilaterally Abdomen: soft, non-tender; bowel sounds normal; no masses,  no organomegaly Extremities: extremities normal, atraumatic, no cyanosis or edema, Homans sign is negative, no sign of DVT and no edema, redness or tenderness in the calves or thighs no cervicall or axillary adenopathy No carotid bruits  Diagnostic Studies & Laboratory data:     Recent Radiology Findings: .Nm Pet Image Initial (pi) Skull Base To Thigh  04/07/2015  CLINICAL DATA:  Initial treatment strategy for lung nodules. EXAM: NUCLEAR MEDICINE PET SKULL BASE TO THIGH TECHNIQUE: 7.7 mCi F-18 FDG was injected intravenously. Full-ring PET imaging was performed from the skull base to thigh after the radiotracer. CT data was obtained and used for attenuation correction and anatomic localization. FASTING BLOOD GLUCOSE:  Value: 112 mg/dl COMPARISON:  03/24/2015 ; 07/07/2011 FINDINGS: NECK Abnormal hypodense lesion inferiorly in the right temporal lobe with associated hypometabolism. This lesion is only partially included. Visualized hypodense portion measures 4.2 by 3.1 cm on image 1 of series 4. Symmetric physiologic  salivary and glottic activity. Right thyroid nodules are similar to prior exams and not hypermetabolic. CHEST Hypermetabolic paratracheal, bilateral hilar, and AP window lymph nodes are present along with a hypermetabolic right infrahilar lymph node. An index paratracheal node measures 7 mm on thickness on image 55 series 4 with maximum standard uptake value 8.3. This node was previously not appreciably hypermetabolic. A right hilar node is difficult to measure in size but has a maximum standard uptake value of 16.3 (formerly 11.5 Bilateral pulmonary nodules are present. A right middle lobe nodule measuring 0.9 by 1.2  cm on image 40 of series 6 (previously the same size on 10/11/2011) has a maximum standard uptake value of 6.4 (previously 5.2 back on 07/07/2011). A previous left upper lobe nodule from 10/11/2011 which was hypermetabolic has resolved in the antrum. Other hypermetabolic nodules are present. The prior hypermetabolic left rib lesion is no longer hypermetabolic and has healed. A 1.6 by 1.4 cm right upper lobe nodule with solid and sub solid components has maximum standard uptake value 4.2, measured on image 22 series 6. This nodule was not present in 2013. Dense mitral calcification. Coronary, aortic arch, and branch vessel atherosclerotic vascular disease. ABDOMEN/PELVIS Dense calcifications in the right lower quadrant are extraluminal and probably vascular. No associated hypermetabolic activity. Aortoiliac atherosclerotic vascular disease. SKELETON No focal hypermetabolic activity to suggest skeletal metastasis. IMPRESSION: 1. Generally similar appearance of hypermetabolic thoracic adenopathy and scattered hypermetabolic pulmonary nodules, compared to the 2013 prior PET-CT. There is new right upper lobe hypermetabolic nodule since that time, but also interval resolution of a right middle lobe hypermetabolic nodule since that time. Looking back, some of these nodules have been present since 2007, along  with some of the thoracic adenopathy. Suspicion is those raised for sarcoid or similar granulomatous process. It is difficult to exclude a new synchronous right upper lobe lung cancer, but the hypermetabolic mixed density nodule could easily be due to presumed underlying granulomatous process. Biopsy or observation suggested. 2. There is a new 4.2 by 3.1 cm hypodense and hypometabolic lesion in the right temporal lobe. If the patient has had a stroke over the past 3 years than that might explain this. Otherwise, this could represent a cystic mass or other lesion, and I would recommend MRI brain with and without contrast. 3. Multinodular goiter primarily involving the right lobe. 4. Coronary, aortic arch, and branch vessel atherosclerotic vascular disease. Dense mitral valve calcification. Electronically Signed   By: Van Clines M.D.   On: 04/07/2015 15:23    Ct Chest Wo Contrast  03/24/2015  CLINICAL DATA:  Followup lung nodules EXAM: CT CHEST WITHOUT CONTRAST TECHNIQUE: Multidetector CT imaging of the chest was performed following the standard protocol without IV contrast. COMPARISON:  02/12/2014 FINDINGS: Mediastinum: The heart size appears within normal limits. No pericardial effusion identified. Calcified atherosclerotic disease involves the thoracic aorta as well as the RCA, LAD and left circumflex coronary artery. 9 mm right paratracheal lymph node is stable. There is a calcified sub- carinal lymph node noted. Lungs/Pleura: The right upper lobe part solid nodule measures 1.5 cm and has a central solid component which measures 6 mm. On the previous exam this nodule measured 1.2 cm. Solid right upper lobe lung nodule is stable measuring 3 mm, image 26/series 4. Small ground-glass attenuating nodule within the right upper lobe measures 5 mm, image 30 of series 4. Previously 3 mm. Solid right middle lobe lung nodule measures 11 mm and is unchanged from previous exam, image 34 of series 4. Stable left  upper lobe ground-glass attenuating nodule measuring 7 mm, image 31 of series 4. Stable solid nodule within the left upper lobe measuring 4 mm. Upper Abdomen: There is no focal liver abnormality identified. The adrenal glands are within normal limits. Normal appearance of the spleen. The visualized portions of the pancreas and left kidney are normal. Musculoskeletal: Spondylosis noted within the thoracic spine. No aggressive lytic or sclerotic bone lesions. IMPRESSION: 1. Multiple solid and part solid nodules are again identified within both lungs. Within the right upper lobe there is a  part solid nodule which has increased in size from previous exam. This now measures 1.5 cm and has a central solid component of 6 mm. 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 Biopsy or surgical resection of this nodule is recommended. 2. The other small nodules are unchanged. 3. Aortic atherosclerosis as well as 3 vessel coronary artery calcification. Electronically Signed   By: Kerby Moors M.D.   On: 03/24/2015 15:53   Ct Chest Wo Contrast  02/12/2014   CLINICAL DATA:  Followup right upper lobe pulmonary nodule. Other nodules have been stable for more than 2 years, compatible with a benign etiology.  EXAM: CT CHEST WITHOUT CONTRAST  TECHNIQUE: Multidetector CT imaging of the chest was performed following the standard protocol without IV contrast.  COMPARISON:  08/18/2013  FINDINGS: Biapical pleural thickening noted. Tracheal diverticulum in the 7 o'clock position image 12 is reidentified. Areas of subpleural scarring are stable. 5 mm right upper lobe pulmonary nodule image 18 is stable. Other pulmonary nodules identified elsewhere are stable. Intrapulmonary lymph node abutting the minor fissure image 34 is stable. Left lower lobe calcified granuloma image 38 is stable.  Multilevel disc degenerative change noted. No compression deformity.  Thyroid nodularity is stable. Stable small pretracheal nodes.  Assessment for central lymphadenopathy is suboptimal due to lack of contrast. Stable 1 cm dominant pretracheal node image 26. Moderate atheromatous aortic calcification identified. Heart size is normal. No pleural effusion or pericardial effusion. Aortic ectasia at the level of the distal arch is stable measuring 3.5 cm image 21.  IMPRESSION: Stable right upper lobe pulmonary nodule for which followup was previously recommended due to nonvisualization on previous studies. Other previously evaluated nodules elsewhere are stable and do not require specific follow-up. Followup chest CT is recommended March 2016.   Electronically Signed   By: Conchita Paris M.D.   On: 02/12/2014 14:21  Ct Chest Wo Contrast  08/18/2013   CLINICAL DATA:  Followup of pulmonary nodules.  Ex-smoker.  EXAM: CT CHEST WITHOUT CONTRAST  TECHNIQUE: Multidetector CT imaging of the chest was performed following the standard protocol without IV contrast.  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  Mild centrilobular emphysema. Suspicion of either a soft tissue or a ground-glass opacity nodule developing around a branching pulmonary artery. On the order of 5 mm on image 18/series 4 and 6 mm on image 57 coronal. This is not readily apparent comparing back to 1/30 /13.  Favor scarring in the subpleural medial right apex on image 14, unchanged.  A 3 mm right upper lobe lung nodule on image 26/series 4 is similar back to 06/29/2011, consistent with a benign etiology.  Right middle lobe subpleural nodule measures 1.0 x 0.9 cm on image 36 and is similar back to 2013, consistent with a benign etiology.  Stable 3 mm lingular nodule on image 34/series 4. This was present back to the 2013 exam.  No pleural fluid.  Heart/Mediastinum: Low-density right-sided  thyroid nodules which are incompletely characterized but were grossly similar back to 2013. No mediastinal or definite hilar adenopathy, given limitations of unenhanced CT. Aortic and branch vessel atherosclerosis. Normal heart size with multivessel coronary artery atherosclerosis. No pericardial effusion.  Upper Abdomen:  Normal adrenal glands.  Bones/Musculoskeletal: Sclerosis involving the 6 posterior lateral left rib, likely posttraumatic.  IMPRESSION: 1. Similar appearance of right middle lobe, right upper lobe, and lingular nodules since 2013, consistent with a benign etiology. 2. 5-6 mm right upper lobe lung nodule. This is likely new when compared to remote prior exams. Given the smoking history, follow-up chest CT at 6-12 months is recommended. This recommendation follows the consensus statement: "Guidelines for Management of Small Pulmonary Nodules Detected on CT Scans: A Statement from the [employer]" as published in Radiology2005;237:395-400. Online at: https://www.arnold.com/. 3.  Atherosclerosis, including within the coronary arteries.   Electronically Signed   By: Abigail Miyamoto M.D.   On: 08/18/2013 16:54     Ct Chest Low Dose Pilot W/o Cm  08/15/2012  *RADIOLOGY REPORT*  Clinical Data: History of smoking.  Pulmonary nodules noted on prior chest CT 02/15/2012.  Follow-up examination.  CT CHEST LOW DOSE PILOT WITHOUT CONTRAST  Technique: Multidetector CT imaging of the chest using the standard low-dose protocol without administration of intravenous contrast.  Comparison: Chest CT 02/15/2012.  Findings:  Mediastinum: Heart size is normal. Small amount of pericardial fluid and/or thickening is unlikely to be of any hemodynamic significance of this time.  No associated pericardial calcification. There is atherosclerosis of the thoracic aorta, the great vessels of the mediastinum and the coronary arteries, including calcified atherosclerotic plaque in the left main,  left anterior descending, left circumflex and right coronary arteries. Calcifications of the mitral annulus. No pathologically enlarged mediastinal or hilar lymph nodes. Please note that accurate exclusion of hilar adenopathy is limited on noncontrast CT scans. Esophagus is unremarkable in appearance.  Lungs/Pleura: Previously described 4 mm nodule in the left upper lobe is unchanged (image 122 of series 3) measuring only 4 mm on today's examination.  Previously noted right middle lobe nodule abutting the minor fissure is unchanged measuring 10 x 9 mm (image 138 of series 3).  No other definite new or enlarging suspicious appearing pulmonary nodules or masses are otherwise noted. Bilateral apical nodular areas of pleuroparenchymal thickening are unchanged compared to prior examinations and most compatible with areas of chronic scarring.  No acute consolidative airspace disease.  No pleural effusions.  There is a background of mild diffuse bronchial wall thickening with mild centrilobular and paraseptal emphysema.  Upper Abdomen: Unremarkable.  Musculoskeletal: There are no aggressive appearing lytic or blastic lesions noted in the visualized portions of the skeleton.  IMPRESSION: 1.  No change in previously noted 10 x 9 mm right middle lobe nodule or 4 mm nodule in the lingula when compared to remote prior examination from 06/29/2011.  Based on the behavior of these nodules, these are favored to be benign, however, continued attention on follow-up studies is recommended. At this time, a 1 year follow-up chest CT in March 2015 is recommended. 2.  Atherosclerosis, including left main and three-vessel coronary artery disease.  Assessment for potential risk factor modification, dietary therapy or pharmacologic therapy may be warranted, if clinically indicated.  3.  Mild diffuse bronchial wall thickening with mild centrilobular paraseptal emphysema.   Original Report Authenticated By: Vinnie Langton, M.D.     Mr  Jeri Cos Wo Contrast  04/29/2015  CLINICAL DATA:  Right temporal lobe lesion on recent PET-CT. EXAM: MRI HEAD WITHOUT AND WITH CONTRAST TECHNIQUE: Multiplanar, multiecho pulse sequences of the brain and surrounding structures were obtained without and with intravenous contrast. CONTRAST:  80m MULTIHANCE GADOBENATE DIMEGLUMINE 529 MG/ML IV SOLN COMPARISON:  PET-CT 04/07/2015.  Brain MRI 07/21/2011. FINDINGS: There is no evidence of acute infarct, intracranial hemorrhage, or  extra-axial fluid collection. Mild-to-moderate global cerebral atrophy is again seen. Patchy to confluent T2 hyperintensities are again seen in the periventricular greater than subcortical cerebral white matter bilaterally, compatible with moderate chronic small vessel ischemic disease. As seen on recent PET-CT, there is an intra-axial mass in the right temporal lobe which is new from the prior MRI. The mass measures 5.6 x 4.1 x 3.9 cm and is largely cystic with thin peripheral enhancement. However, there are 2 enhancing mural soft tissue nodules. The largest measures 1.9 x 1.0 cm along the anterosuperior aspect of the lesion, while the smaller measures 8 mm along the inferolateral aspect. There is very mild surrounding edema which slightly extends into the internal and external capsules. Minimal leftward midline shift measures 2-3 mm. No other enhancing brain lesions are seen. Prior bilateral cataract extraction is noted. The paranasal sinuses are clear. There is a trace right mastoid effusion which is chronic. Major intracranial vascular flow voids are preserved. IMPRESSION: 1. 5.6 cm cystic mass with mural nodules in the right temporal lobe with very mild surrounding edema. Primary considerations include low-grade primary CNS neoplasm and cystic metastasis, with the former favored. 2. Moderate chronic small vessel ischemic disease. Electronically Signed   By: Logan Bores M.D.   On: 04/29/2015 16:40    Recent Lab Findings: Lab Results    Component Value Date   WBC 5.5 08/01/2011   HGB 13.7 08/01/2011   HCT 39.4 08/01/2011   PLT 172 08/01/2011   GLUCOSE 97 08/01/2011   CHOL 195 10/01/2014   TRIG 63.0 10/01/2014   HDL 74.70 10/01/2014   LDLCALC 108* 10/01/2014   ALT 20 10/01/2014   AST 18 10/01/2014   NA 140 08/01/2011   K 3.5 08/01/2011   CL 103 08/01/2011   CREATININE 0.80 04/29/2015   BUN 13 08/01/2011   CO2 29 08/01/2011   INR 0.98 08/01/2011      Assessment / Plan:    Multiple solid and part solid nodules are again identified within both lungs. Within the right upper lobe there is a part solid nodule which has increased in size from previous exam.   Generally similar appearance of hypermetabolic thoracic adenopathy and scattered hypermetabolic pulmonary nodules, compared to the 2013 prior PET-CT. There is new right upper lobe hypermetabolic nodule since that time, but also interval resolution of a right middle lobe hypermetabolic nodule since that time. The patient has had waxing and waning pulmonary nodules bilaterally. Some have resolved there is a right upper lobe nodule which is increased in size. I discussed with patient proceeding with biopsy now versus observation. She prefers to wait. We'll plan on a super D CT scan of the chest in 3-4 months and if the right lung lesion is the same or has enlarged we'll proceed with navigation bronchoscopy and attempted biopsy.  The patient has a new right brain lesion suspected on PET scan, and  new  MRI of the brain to compare to the one previously done in 2013.  5.6 cm cystic mass with mural nodules in the right temporal lobe with very mild surrounding edema  Atherosclerosis, including left main and three-vessel coronary artery disease, incidental noted on CT.  Known CAD with previous stent RCA, Followed by Dr Radford Pax    The patient is asymptomatic from the right brain lesion, she has been referred to neurosurgery urgently for consultation and attempt of to obtain a  diagnosis, her previous nodal disease in 2013 a chest was benign. Patient has discussed proceeding with  resection of the right temporal lobe lesion by Dr. Arnoldo Morale next week. I again reviewed with her the possible pathology findings that this could represent including possible lung cancer metastatic to the brain. After the pathology results are finalized we'll make further plans depending on those findings. I plan to see the patient back between 2 and 3 months anyway.  Grace Isaac MD  Beeper [phone] Office [phone] 06/03/2015 12:42 PM

## 2015-06-07 ENCOUNTER — Encounter (HOSPITAL_COMMUNITY)
Admission: RE | Admit: 2015-06-07 | Discharge: 2015-06-07 | Disposition: A | Discharge status: Home / Self Care | Payer: Medicare Other | Source: Ambulatory Visit | Attending: Neurosurgery | Admitting: Neurosurgery

## 2015-06-07 ENCOUNTER — Encounter (HOSPITAL_COMMUNITY): Payer: Self-pay

## 2015-06-07 HISTORY — DX: Anxiety disorder, unspecified: F41.9

## 2015-06-07 LAB — BASIC METABOLIC PANEL
ANION GAP: 9 (ref 5–15)
BUN: 14 mg/dL (ref 6–20)
CALCIUM: 9.2 mg/dL (ref 8.9–10.3)
CO2: 27 mmol/L (ref 22–32)
CREATININE: 0.66 mg/dL (ref 0.44–1.00)
Chloride: 104 mmol/L (ref 101–111)
Glucose, Bld: 115 mg/dL — ABNORMAL HIGH (ref 65–99)
Potassium: 4.2 mmol/L (ref 3.5–5.1)
SODIUM: 140 mmol/L (ref 135–145)

## 2015-06-07 LAB — CBC
HEMATOCRIT: 43.8 % (ref 36.0–46.0)
HEMOGLOBIN: 14.6 g/dL (ref 12.0–15.0)
MCH: 30.3 pg (ref 26.0–34.0)
MCHC: 33.3 g/dL (ref 30.0–36.0)
MCV: 90.9 fL (ref 78.0–100.0)
Platelets: 186 10*3/uL (ref 150–400)
RBC: 4.82 MIL/uL (ref 3.87–5.11)
RDW: 13.7 % (ref 11.5–15.5)
WBC: 5.7 10*3/uL (ref 4.0–10.5)

## 2015-06-07 NOTE — Pre-Procedure Instructions (Signed)
    Margaret Hale  06/07/2015      [facility] - [place], [place] [address] Phone: [phone] Fax: [phone]    Your procedure is scheduled on 06/09/15  Report to [facility] Admitting at 10 A.M.  Call this number if you have problems the morning of surgery:  [phone]   Remember:  Do not eat food or drink liquids after midnight.  Take these medicines the morning of surgery with A SIP OF WATER --norvasc,clondine,effexor   Do not wear jewelry, make-up or nail polish.  Do not wear lotions, powders, or perfumes.  You may wear deodorant.  Do not shave 48 hours prior to surgery.  Men may shave face and neck.  Do not bring valuables to the hospital.  [facility] is not responsible for any belongings or valuables.  Contacts, dentures or bridgework may not be worn into surgery.  Leave your suitcase in the car.  After surgery it may be brought to your room.  For patients admitted to the hospital, discharge time will be determined by your treatment team.  Patients discharged the day of surgery will not be allowed to drive home.   Name and phone number of your driver:   Special instructions:    Please read over the following fact sheets that you were given. Pain Booklet, Coughing and Deep Breathing and Surgical Site Infection Prevention

## 2015-06-08 ENCOUNTER — Other Ambulatory Visit (HOSPITAL_COMMUNITY): Payer: Self-pay | Admitting: Neurosurgery

## 2015-06-08 ENCOUNTER — Ambulatory Visit (HOSPITAL_COMMUNITY)
Admission: RE | Admit: 2015-06-08 | Discharge: 2015-06-08 | Disposition: A | Discharge status: Home / Self Care | Payer: Medicare Other | Source: Ambulatory Visit | Attending: Neurosurgery | Admitting: Neurosurgery

## 2015-06-08 ENCOUNTER — Other Ambulatory Visit: Payer: Self-pay | Admitting: Anatomic Pathology & Clinical Pathology

## 2015-06-08 DIAGNOSIS — R9431 Abnormal electrocardiogram [ECG] [EKG]: Secondary | ICD-10-CM | POA: Insufficient documentation

## 2015-06-08 DIAGNOSIS — I10 Essential (primary) hypertension: Secondary | ICD-10-CM | POA: Insufficient documentation

## 2015-06-08 DIAGNOSIS — D496 Neoplasm of unspecified behavior of brain: Secondary | ICD-10-CM | POA: Insufficient documentation

## 2015-06-08 DIAGNOSIS — G9389 Other specified disorders of brain: Secondary | ICD-10-CM

## 2015-06-08 DIAGNOSIS — R001 Bradycardia, unspecified: Secondary | ICD-10-CM

## 2015-06-08 MED ORDER — CEFAZOLIN SODIUM-DEXTROSE 2-3 GM-% IV SOLR
2.0000 g | INTRAVENOUS | Status: AC
  Administered 2015-06-09: 2 g via INTRAVENOUS
  Filled 2015-06-08: qty 50

## 2015-06-08 MED ORDER — GADOBENATE DIMEGLUMINE 529 MG/ML IV SOLN
15.0000 mL | Freq: Once | INTRAVENOUS | Status: AC | PRN
  Administered 2015-06-08: 15 mL via INTRAVENOUS

## 2015-06-08 NOTE — Progress Notes (Signed)
Anesthesia Chart Review: Patient is a 81 year old female scheduled for right temporal craniotomy on 06/09/15 by Dr. Arnoldo Morale.  History includes former smoker, CAD s/p RCA BMS '07, HTN, anxiety, lung mass, dizziness. She is followed by CT surgeon Dr. Servando Snare for bilateral lung nodules. By his notes, some have been hypermetabolic on PET, but have been waxing and waning on CT. Previous biopsies have shown reactive changes, atypical cyst and some questionable atypical cells. Her mediastinoscopy was negative and showed granulomatous disease. She had not been interested in further biopsies unless getting bigger, so was getting serial imaging. On 04/07/15 PET scan a new right temporal lobe mass was noted. Dr. Servando Snare referred her urgently to Neurosurgery for resection and definitive diagnosis.    PCP is Dr. [name].  Cardiologist is Dr. Fransico Him who cleared patient for surgery following a 05/14/15. Patient was also asymptomatic from a CV standpoint.   Meds include amlodipine, Toprol XL, valsartan.  06/07/15 EKG: SB at 46 bpm, cannot rule out anterior infarct (age undetermined).  05/14/15 Echo: Impressions: - Normal LV systolic function; mild LVH with proximal septal thickening; grade 1 diastolic dysfunction; mild LAE; trace MR; mild TR; mildly elevated pulmonary pressure.  03/04/14 - 04/02/14 30 day Event Monitor: SB/SR. Average HR was 60 bpm. Transmission contains 1 recorded event. Signal replacement noted due to low voltage. Artifact noted.   02/24/14 Nuclear stress test: Overall Impression: Low risk stress nuclear study with inferoapical attenuation artifact. No significant reversibility. LV Ejection Fraction: 68%. LV Wall Motion: Normal Wall Motion.  10/20/05 LHC: CONCLUSIONS: 1. Mild to moderate left main disease with diameter stenosis of 35%  documented by intravascular ultrasound. 2. Successful bare metal stent of the mid right coronary with reduction in  stenosis from  99% to less than 10% with TIMI grade 3 flow. There are  two overlapping 3.5 Liberte stents placed.  03/24/15 CT Chest w/o contrast: IMPRESSION: 1. Multiple solid and part solid nodules are again identified within both lungs. Within the right upper lobe there is a part solid nodule which has increased in size from previous exam. This now measures 1.5 cm and has a central solid component of 6 mm. 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 Biopsy or surgical resection of this nodule is recommended. 2. The other small nodules are unchanged. 3. Aortic atherosclerosis as well as 3 vessel coronary artery Calcification.  Preoperative labs noted. T&S was not done at PAT, so will need to be done on the day of surgery.   If no acute changes then I anticipate that she can proceed as planned.  George Hugh [facility] Short Stay Center/Anesthesiology Phone [phone] 06/08/2015 2:40 PM

## 2015-06-09 ENCOUNTER — Inpatient Hospital Stay (HOSPITAL_COMMUNITY): Payer: Medicare Other | Admitting: Vascular Surgery

## 2015-06-09 ENCOUNTER — Inpatient Hospital Stay (HOSPITAL_COMMUNITY)
Admission: RE | Admit: 2015-06-09 | Discharge: 2015-06-10 | DRG: 027 | Disposition: A | Discharge status: Home / Self Care | Payer: Medicare Other | Source: Ambulatory Visit | Attending: Neurosurgery | Admitting: Neurosurgery

## 2015-06-09 ENCOUNTER — Encounter (HOSPITAL_COMMUNITY)
Admission: RE | Disposition: A | Discharge status: Home / Self Care | Payer: Self-pay | Source: Ambulatory Visit | Attending: Neurosurgery

## 2015-06-09 ENCOUNTER — Inpatient Hospital Stay (HOSPITAL_COMMUNITY): Payer: Medicare Other

## 2015-06-09 ENCOUNTER — Inpatient Hospital Stay (HOSPITAL_COMMUNITY): Payer: Medicare Other | Admitting: Certified Registered Nurse Anesthetist

## 2015-06-09 DIAGNOSIS — D496 Neoplasm of unspecified behavior of brain: Secondary | ICD-10-CM | POA: Diagnosis present

## 2015-06-09 DIAGNOSIS — R918 Other nonspecific abnormal finding of lung field: Secondary | ICD-10-CM | POA: Diagnosis present

## 2015-06-09 DIAGNOSIS — E785 Hyperlipidemia, unspecified: Secondary | ICD-10-CM | POA: Diagnosis present

## 2015-06-09 DIAGNOSIS — I2510 Atherosclerotic heart disease of native coronary artery without angina pectoris: Secondary | ICD-10-CM | POA: Diagnosis present

## 2015-06-09 DIAGNOSIS — Z87891 Personal history of nicotine dependence: Secondary | ICD-10-CM | POA: Diagnosis not present

## 2015-06-09 DIAGNOSIS — Z9889 Other specified postprocedural states: Secondary | ICD-10-CM

## 2015-06-09 DIAGNOSIS — I10 Essential (primary) hypertension: Secondary | ICD-10-CM | POA: Diagnosis present

## 2015-06-09 DIAGNOSIS — F419 Anxiety disorder, unspecified: Secondary | ICD-10-CM | POA: Diagnosis present

## 2015-06-09 DIAGNOSIS — Z452 Encounter for adjustment and management of vascular access device: Secondary | ICD-10-CM

## 2015-06-09 DIAGNOSIS — E559 Vitamin D deficiency, unspecified: Secondary | ICD-10-CM | POA: Diagnosis present

## 2015-06-09 DIAGNOSIS — Z955 Presence of coronary angioplasty implant and graft: Secondary | ICD-10-CM

## 2015-06-09 DIAGNOSIS — J449 Chronic obstructive pulmonary disease, unspecified: Secondary | ICD-10-CM | POA: Diagnosis present

## 2015-06-09 HISTORY — PX: CRANIOTOMY: SHX93

## 2015-06-09 LAB — TYPE AND SCREEN
ABO/RH(D): B POS
ANTIBODY SCREEN: NEGATIVE

## 2015-06-09 LAB — MRSA PCR SCREENING: MRSA by PCR: NEGATIVE

## 2015-06-09 SURGERY — CRANIOTOMY TUMOR EXCISION
Anesthesia: General | Site: Head | Laterality: Right

## 2015-06-09 MED ORDER — SUGAMMADEX SODIUM 200 MG/2ML IV SOLN
INTRAVENOUS | Status: DC | PRN
  Administered 2015-06-09: 200 mg via INTRAVENOUS

## 2015-06-09 MED ORDER — EPHEDRINE SULFATE 50 MG/ML IJ SOLN
INTRAMUSCULAR | Status: AC
  Filled 2015-06-09: qty 1

## 2015-06-09 MED ORDER — MIDAZOLAM HCL 5 MG/5ML IJ SOLN
INTRAMUSCULAR | Status: DC | PRN
  Administered 2015-06-09: 1 mg via INTRAVENOUS

## 2015-06-09 MED ORDER — LIDOCAINE HCL (CARDIAC) 20 MG/ML IV SOLN
INTRAVENOUS | Status: AC
  Filled 2015-06-09: qty 5

## 2015-06-09 MED ORDER — BUPIVACAINE-EPINEPHRINE 0.5% -1:200000 IJ SOLN
INTRAMUSCULAR | Status: DC | PRN
  Administered 2015-06-09: 30 mL

## 2015-06-09 MED ORDER — FENTANYL CITRATE (PF) 250 MCG/5ML IJ SOLN
INTRAMUSCULAR | Status: AC
  Filled 2015-06-09: qty 5

## 2015-06-09 MED ORDER — ACETAMINOPHEN 325 MG PO TABS: 650.0000 mg | ORAL_TABLET | ORAL | Status: DC | PRN

## 2015-06-09 MED ORDER — PROPOFOL 10 MG/ML IV BOLUS
INTRAVENOUS | Status: AC
  Filled 2015-06-09: qty 20

## 2015-06-09 MED ORDER — PROPOFOL 10 MG/ML IV BOLUS
INTRAVENOUS | Status: DC | PRN
  Administered 2015-06-09: 30 mg via INTRAVENOUS
  Administered 2015-06-09: 50 mg via INTRAVENOUS

## 2015-06-09 MED ORDER — AMLODIPINE BESYLATE 10 MG PO TABS
10.0000 mg | ORAL_TABLET | Freq: Every evening | ORAL | Status: DC
  Administered 2015-06-09: 10 mg via ORAL
  Filled 2015-06-09: qty 1

## 2015-06-09 MED ORDER — ACETAMINOPHEN 160 MG/5ML PO SOLN
325.0000 mg | ORAL | Status: DC | PRN
  Filled 2015-06-09: qty 20.3

## 2015-06-09 MED ORDER — ONDANSETRON HCL 4 MG/2ML IJ SOLN
INTRAMUSCULAR | Status: AC
  Filled 2015-06-09: qty 2

## 2015-06-09 MED ORDER — HYDRALAZINE HCL 20 MG/ML IJ SOLN
10.0000 mg | INTRAMUSCULAR | Status: DC | PRN
  Administered 2015-06-09: 20 mg via INTRAVENOUS
  Filled 2015-06-09: qty 1

## 2015-06-09 MED ORDER — LEVETIRACETAM 500 MG/5ML IV SOLN
500.0000 mg | Freq: Two times a day (BID) | INTRAVENOUS | Status: DC
  Administered 2015-06-09 – 2015-06-10 (×2): 500 mg via INTRAVENOUS
  Filled 2015-06-09 (×5): qty 5

## 2015-06-09 MED ORDER — ONDANSETRON HCL 4 MG PO TABS: 4.0000 mg | ORAL_TABLET | ORAL | Status: DC | PRN

## 2015-06-09 MED ORDER — SODIUM CHLORIDE 0.9 % IJ SOLN
INTRAMUSCULAR | Status: AC
  Filled 2015-06-09: qty 10

## 2015-06-09 MED ORDER — BACITRACIN ZINC 500 UNIT/GM EX OINT
TOPICAL_OINTMENT | CUTANEOUS | Status: DC | PRN
  Administered 2015-06-09: 1 via TOPICAL

## 2015-06-09 MED ORDER — DEXAMETHASONE SODIUM PHOSPHATE 4 MG/ML IJ SOLN: 4.0000 mg | Freq: Three times a day (TID) | INTRAMUSCULAR | Status: DC

## 2015-06-09 MED ORDER — REMIFENTANIL HCL 1 MG IV SOLR
0.0125 ug/kg/min | INTRAVENOUS | Status: AC
  Administered 2015-06-09: .3 ug/kg/min via INTRAVENOUS
  Filled 2015-06-09: qty 2000

## 2015-06-09 MED ORDER — FENTANYL CITRATE (PF) 100 MCG/2ML IJ SOLN
INTRAMUSCULAR | Status: AC
  Filled 2015-06-09: qty 2

## 2015-06-09 MED ORDER — SODIUM CHLORIDE 0.9 % IR SOLN
Status: DC | PRN
  Administered 2015-06-09: 13:00:00

## 2015-06-09 MED ORDER — ACETAMINOPHEN 650 MG RE SUPP
650.0000 mg | RECTAL | Status: DC | PRN
Start: 2015-06-09 — End: 2015-06-10

## 2015-06-09 MED ORDER — SODIUM CHLORIDE 0.9 % IV SOLN
INTRAVENOUS | Status: DC | PRN
  Administered 2015-06-09: 11:00:00 via INTRAVENOUS

## 2015-06-09 MED ORDER — ACETAMINOPHEN 325 MG PO TABS: 325.0000 mg | ORAL_TABLET | ORAL | Status: DC | PRN

## 2015-06-09 MED ORDER — CEFAZOLIN SODIUM-DEXTROSE 2-3 GM-% IV SOLR
2.0000 g | Freq: Three times a day (TID) | INTRAVENOUS | Status: AC
  Administered 2015-06-09 – 2015-06-10 (×2): 2 g via INTRAVENOUS
  Filled 2015-06-09 (×2): qty 50

## 2015-06-09 MED ORDER — HYDROCODONE-ACETAMINOPHEN 5-325 MG PO TABS: 1.0000 | ORAL_TABLET | ORAL | Status: DC | PRN

## 2015-06-09 MED ORDER — LACTATED RINGERS IV SOLN
INTRAVENOUS | Status: DC
  Administered 2015-06-09 (×2): via INTRAVENOUS

## 2015-06-09 MED ORDER — ROPINIROLE HCL 0.5 MG PO TABS
0.5000 mg | ORAL_TABLET | Freq: Every day | ORAL | Status: DC
  Administered 2015-06-09: 0.5 mg via ORAL
  Filled 2015-06-09 (×2): qty 1

## 2015-06-09 MED ORDER — ROCURONIUM BROMIDE 50 MG/5ML IV SOLN
INTRAVENOUS | Status: AC
  Filled 2015-06-09: qty 1

## 2015-06-09 MED ORDER — THROMBIN 20000 UNITS EX SOLR
CUTANEOUS | Status: DC | PRN
  Administered 2015-06-09: 13:00:00 via TOPICAL

## 2015-06-09 MED ORDER — GLYCOPYRROLATE 0.2 MG/ML IJ SOLN
INTRAMUSCULAR | Status: DC | PRN
  Administered 2015-06-09: 0.2 mg via INTRAVENOUS

## 2015-06-09 MED ORDER — PHENYLEPHRINE HCL 10 MG/ML IJ SOLN
10.0000 mg | INTRAVENOUS | Status: DC | PRN
  Administered 2015-06-09: 10 ug/min via INTRAVENOUS

## 2015-06-09 MED ORDER — BISACODYL 10 MG RE SUPP
10.0000 mg | Freq: Every day | RECTAL | Status: DC | PRN
Start: 2015-06-09 — End: 2015-06-10

## 2015-06-09 MED ORDER — FAMOTIDINE IN NACL 20-0.9 MG/50ML-% IV SOLN
20.0000 mg | Freq: Two times a day (BID) | INTRAVENOUS | Status: DC
  Administered 2015-06-09 – 2015-06-10 (×3): 20 mg via INTRAVENOUS
  Filled 2015-06-09 (×3): qty 50

## 2015-06-09 MED ORDER — ONDANSETRON HCL 4 MG/2ML IJ SOLN
INTRAMUSCULAR | Status: DC | PRN
  Administered 2015-06-09: 4 mg via INTRAVENOUS

## 2015-06-09 MED ORDER — DEXAMETHASONE SODIUM PHOSPHATE 10 MG/ML IJ SOLN
INTRAMUSCULAR | Status: DC | PRN
  Administered 2015-06-09: 20 mg via INTRAVENOUS

## 2015-06-09 MED ORDER — SODIUM CHLORIDE 0.9 % IV SOLN
0.0125 ug/kg/min | INTRAVENOUS | Status: AC
  Filled 2015-06-09: qty 2000

## 2015-06-09 MED ORDER — DEXAMETHASONE SODIUM PHOSPHATE 10 MG/ML IJ SOLN
6.0000 mg | Freq: Four times a day (QID) | INTRAMUSCULAR | Status: DC
  Administered 2015-06-09 – 2015-06-10 (×3): 6 mg via INTRAVENOUS
  Filled 2015-06-09 (×4): qty 1

## 2015-06-09 MED ORDER — MORPHINE SULFATE (PF) 2 MG/ML IV SOLN: 1.0000 mg | INTRAVENOUS | Status: DC | PRN

## 2015-06-09 MED ORDER — ROCURONIUM BROMIDE 100 MG/10ML IV SOLN
INTRAVENOUS | Status: DC | PRN
  Administered 2015-06-09 (×4): 10 mg via INTRAVENOUS
  Administered 2015-06-09: 50 mg via INTRAVENOUS
  Administered 2015-06-09: 10 mg via INTRAVENOUS

## 2015-06-09 MED ORDER — OXYCODONE HCL 5 MG/5ML PO SOLN: 5.0000 mg | Freq: Once | ORAL | Status: DC | PRN

## 2015-06-09 MED ORDER — ONDANSETRON HCL 4 MG/2ML IJ SOLN: 4.0000 mg | INTRAMUSCULAR | Status: DC | PRN

## 2015-06-09 MED ORDER — POTASSIUM CHLORIDE 2 MEQ/ML IV SOLN
INTRAVENOUS | Status: DC
  Administered 2015-06-09 – 2015-06-10 (×2): via INTRAVENOUS
  Filled 2015-06-09 (×4): qty 1000

## 2015-06-09 MED ORDER — OXYCODONE HCL 5 MG PO TABS: 5.0000 mg | ORAL_TABLET | Freq: Once | ORAL | Status: DC | PRN

## 2015-06-09 MED ORDER — PHENYLEPHRINE 40 MCG/ML (10ML) SYRINGE FOR IV PUSH (FOR BLOOD PRESSURE SUPPORT)
PREFILLED_SYRINGE | INTRAVENOUS | Status: AC
  Filled 2015-06-09: qty 10

## 2015-06-09 MED ORDER — SODIUM CHLORIDE 0.9 % IV SOLN
INTRAVENOUS | Status: DC | PRN
  Administered 2015-06-09: 12:00:00 via INTRAVENOUS

## 2015-06-09 MED ORDER — HEMOSTATIC AGENTS (NO CHARGE) OPTIME
TOPICAL | Status: DC | PRN
Start: 2015-06-09 — End: 2015-06-09
  Administered 2015-06-09: 1 via TOPICAL

## 2015-06-09 MED ORDER — 0.9 % SODIUM CHLORIDE (POUR BTL) OPTIME
TOPICAL | Status: DC | PRN
  Administered 2015-06-09 (×2): 1000 mL

## 2015-06-09 MED ORDER — METOPROLOL SUCCINATE ER 25 MG PO TB24
100.0000 mg | ORAL_TABLET | Freq: Every day | ORAL | Status: DC
  Administered 2015-06-10: 100 mg via ORAL
  Filled 2015-06-09 (×3): qty 4

## 2015-06-09 MED ORDER — LABETALOL HCL 5 MG/ML IV SOLN: 10.0000 mg | INTRAVENOUS | Status: DC | PRN

## 2015-06-09 MED ORDER — DEXAMETHASONE SODIUM PHOSPHATE 4 MG/ML IJ SOLN: 4.0000 mg | Freq: Four times a day (QID) | INTRAMUSCULAR | Status: DC

## 2015-06-09 MED ORDER — MIDAZOLAM HCL 2 MG/2ML IJ SOLN
INTRAMUSCULAR | Status: AC
  Filled 2015-06-09: qty 2

## 2015-06-09 MED ORDER — IRBESARTAN 300 MG PO TABS
300.0000 mg | ORAL_TABLET | Freq: Every day | ORAL | Status: DC
  Administered 2015-06-09 – 2015-06-10 (×2): 300 mg via ORAL
  Filled 2015-06-09 (×2): qty 1

## 2015-06-09 MED ORDER — EPHEDRINE SULFATE 50 MG/ML IJ SOLN
INTRAMUSCULAR | Status: DC | PRN
  Administered 2015-06-09: 5 mg via INTRAVENOUS

## 2015-06-09 MED ORDER — SUGAMMADEX SODIUM 500 MG/5ML IV SOLN
INTRAVENOUS | Status: AC
  Filled 2015-06-09: qty 5

## 2015-06-09 MED ORDER — LIDOCAINE HCL (CARDIAC) 20 MG/ML IV SOLN
INTRAVENOUS | Status: DC | PRN
  Administered 2015-06-09: 60 mg via INTRAVENOUS

## 2015-06-09 MED ORDER — FENTANYL CITRATE (PF) 100 MCG/2ML IJ SOLN
25.0000 ug | INTRAMUSCULAR | Status: DC | PRN
  Administered 2015-06-09 (×2): 50 ug via INTRAVENOUS

## 2015-06-09 MED ORDER — PROMETHAZINE HCL 25 MG PO TABS: 12.5000 mg | ORAL_TABLET | ORAL | Status: DC | PRN

## 2015-06-09 MED ORDER — MANNITOL 25 % IV SOLN
INTRAVENOUS | Status: DC | PRN
  Administered 2015-06-09: 12.5 g via INTRAVENOUS

## 2015-06-09 MED ORDER — DOCUSATE SODIUM 100 MG PO CAPS
100.0000 mg | ORAL_CAPSULE | Freq: Two times a day (BID) | ORAL | Status: DC
  Administered 2015-06-09 – 2015-06-10 (×2): 100 mg via ORAL
  Filled 2015-06-09 (×2): qty 1

## 2015-06-09 SURGICAL SUPPLY — 81 items
BAG DECANTER FOR FLEXI CONT (MISCELLANEOUS) ×4 IMPLANT
BIT DRILL WIRE PASS 1.3MM (BIT) IMPLANT
BLADE CLIPPER SURG NEURO (BLADE) IMPLANT
BLADE ULTRA TIP 2M (BLADE) IMPLANT
BRUSH SCRUB EZ PLAIN DRY (MISCELLANEOUS) ×4 IMPLANT
BUR ACORN 6.0 PRECISION (BURR) ×3 IMPLANT
BUR ACORN 6.0MM PRECISION (BURR) ×1
BUR SPIRAL ROUTER 2.3 (BUR) IMPLANT
BUR SPIRAL ROUTER 2.3MM (BUR)
CANISTER SUCT 3000ML PPV (MISCELLANEOUS) ×8 IMPLANT
CATH VENTRIC 35X38 W/TROCAR LG (CATHETERS) IMPLANT
CLIP TI MEDIUM 6 (CLIP) IMPLANT
CONT SPEC 4OZ CLIKSEAL STRL BL (MISCELLANEOUS) ×4 IMPLANT
COVER BACK TABLE 60X90IN (DRAPES) IMPLANT
DRAIN JACKSON PRATT 10MM FLAT (MISCELLANEOUS) ×4 IMPLANT
DRAIN SNY WOU 7FLT (WOUND CARE) IMPLANT
DRAPE MICROSCOPE LEICA (MISCELLANEOUS) ×4 IMPLANT
DRAPE NEUROLOGICAL W/INCISE (DRAPES) ×4 IMPLANT
DRAPE SURG 17X23 STRL (DRAPES) IMPLANT
DRAPE WARM FLUID 44X44 (DRAPE) ×4 IMPLANT
DRILL WIRE PASS 1.3MM (BIT)
ELECT CAUTERY BLADE 6.4 (BLADE) IMPLANT
ELECT REM PT RETURN 9FT ADLT (ELECTROSURGICAL) ×4
ELECTRODE REM PT RTRN 9FT ADLT (ELECTROSURGICAL) ×2 IMPLANT
EVACUATOR 1/8 PVC DRAIN (DRAIN) IMPLANT
EVACUATOR SILICONE 100CC (DRAIN) ×4 IMPLANT
FORCEPS BIPOLAR SPETZLER 8 1.0 (NEUROSURGERY SUPPLIES) ×4 IMPLANT
GAUZE SPONGE 4X4 12PLY STRL (GAUZE/BANDAGES/DRESSINGS) ×4 IMPLANT
GAUZE SPONGE 4X4 16PLY XRAY LF (GAUZE/BANDAGES/DRESSINGS) IMPLANT
GLOVE BIO SURGEON STRL SZ7 (GLOVE) ×12 IMPLANT
GLOVE BIO SURGEON STRL SZ8 (GLOVE) ×8 IMPLANT
GLOVE BIO SURGEON STRL SZ8.5 (GLOVE) ×4 IMPLANT
GLOVE BIOGEL PI IND STRL 7.5 (GLOVE) ×4 IMPLANT
GLOVE BIOGEL PI INDICATOR 7.5 (GLOVE) ×4
GOWN STRL REUS W/ TWL LRG LVL3 (GOWN DISPOSABLE) ×8 IMPLANT
GOWN STRL REUS W/ TWL XL LVL3 (GOWN DISPOSABLE) ×2 IMPLANT
GOWN STRL REUS W/TWL LRG LVL3 (GOWN DISPOSABLE) ×12
GOWN STRL REUS W/TWL XL LVL3 (GOWN DISPOSABLE) ×3
HEMOSTAT SURGICEL 2X14 (HEMOSTASIS) ×4 IMPLANT
KIT BASIN OR (CUSTOM PROCEDURE TRAY) ×4 IMPLANT
KIT CLIP RANEY GUN (KITS) ×4 IMPLANT
KIT DRAIN CSF ACCUDRAIN (MISCELLANEOUS) IMPLANT
KIT ROOM TURNOVER OR (KITS) ×4 IMPLANT
MARKER SKIN DUAL TIP RULER LAB (MISCELLANEOUS) IMPLANT
MARKER SPHERE PSV REFLC 13MM (MARKER) ×8 IMPLANT
NEEDLE HYPO 22GX1.5 SAFETY (NEEDLE) ×4 IMPLANT
NS IRRIG 1000ML POUR BTL (IV SOLUTION) ×4 IMPLANT
PACK CRANIOTOMY (CUSTOM PROCEDURE TRAY) ×4 IMPLANT
PAD ARMBOARD 7.5X6 YLW CONV (MISCELLANEOUS) ×4 IMPLANT
PATTIES SURGICAL .25X.25 (GAUZE/BANDAGES/DRESSINGS) IMPLANT
PATTIES SURGICAL .5 X.5 (GAUZE/BANDAGES/DRESSINGS) IMPLANT
PATTIES SURGICAL .5 X3 (DISPOSABLE) IMPLANT
PATTIES SURGICAL 1X1 (DISPOSABLE) IMPLANT
PIN MAYFIELD SKULL DISP (PIN) IMPLANT
PLATE 1.5  2HOLE LNG NEURO (Plate) ×6 IMPLANT
PLATE 1.5 2HOLE LNG NEURO (Plate) ×6 IMPLANT
RUBBERBAND STERILE (MISCELLANEOUS) ×8 IMPLANT
SCREW SELF DRILL HT 1.5/4MM (Screw) ×24 IMPLANT
SPECIMEN JAR SMALL (MISCELLANEOUS) IMPLANT
SPONGE NEURO XRAY DETECT 1X3 (DISPOSABLE) IMPLANT
SPONGE SURGIFOAM ABS GEL 100 (HEMOSTASIS) ×4 IMPLANT
STAPLER SKIN PROX WIDE 3.9 (STAPLE) ×4 IMPLANT
STOCKINETTE 6  STRL (DRAPES)
STOCKINETTE 6 STRL (DRAPES) IMPLANT
SUT ETHILON 3 0 FSL (SUTURE) IMPLANT
SUT ETHILON 3 0 PS 1 (SUTURE) IMPLANT
SUT NURALON 4 0 TR CR/8 (SUTURE) ×8 IMPLANT
SUT PROLENE 6 0 BV (SUTURE) IMPLANT
SUT SILK 0 TIES 10X30 (SUTURE) IMPLANT
SUT VIC AB 2-0 CP2 18 (SUTURE) ×4 IMPLANT
SUT VIC AB 3-0 FS2 27 (SUTURE) IMPLANT
SUT VICRYL 4-0 PS2 18IN ABS (SUTURE) IMPLANT
SYR CONTROL 10ML LL (SYRINGE) IMPLANT
TAPE CLOTH SURG 4X10 WHT LF (GAUZE/BANDAGES/DRESSINGS) ×4 IMPLANT
TOWEL OR 17X24 6PK STRL BLUE (TOWEL DISPOSABLE) ×4 IMPLANT
TOWEL OR 17X26 10 PK STRL BLUE (TOWEL DISPOSABLE) ×4 IMPLANT
TRAY FOLEY W/METER SILVER 14FR (SET/KITS/TRAYS/PACK) ×4 IMPLANT
TUBE CONNECTING 12'X1/4 (SUCTIONS)
TUBE CONNECTING 12X1/4 (SUCTIONS) IMPLANT
UNDERPAD 30X30 INCONTINENT (UNDERPADS AND DIAPERS) IMPLANT
WATER STERILE IRR 1000ML POUR (IV SOLUTION) ×4 IMPLANT

## 2015-06-09 NOTE — Op Note (Signed)
Brief history: The patient is is a 81 year old white female immigrant from [place] who was worked up with a PET scan for abnormal chest x-ray. This demonstrated a brain lesion. A brain MRI demonstrated a cystic right temporal brain lesion with 2 mural nodules. I discussed the situation with the patient and her son. We discussed the various treatment options including surgery. She has decided to proceed with a craniotomy for resection of the tumors.  Preop diagnosis: Brain tumor  Postop diagnosis: The same (preliminary diagnosis is metastasis)  Procedure: Right frontotemporal parietal craniotomy for resection of brain tumors using microdissection and BrainLab neuro navigation  Surgeon: Dr. Earle Gell  Assistant: Dr. Cyndy Freeze  Esthesia: Gen. endotracheal  Estimated blood loss: 400 mL  Complications: None  Drains: None  Specimens: Tumors  Description of procedure: The patient was brought to the operating room by the anesthesia team. General endotracheal anesthesia was induced. I applied the Mayfield 3 point headrest to the patient's calvarium. Her head was turned to the left. Her right scalp was then shaved with clippers. The patient's preoperative MRI had been previously loaded onto the [employer] computer. We use surface merge to register the patient. The patient's right scalp was then prepared with Betadine scrub and Betadine solution. Sterile drapes were applied. I then injected the area to be incised with Marcaine with epinephrine solution. I used a scalpel to make a pterional type incision in the patient's right scalp. I used Raney clips for wound edge hemostasis. I used electrocautery to divide the underlying temporalis fascia and muscle. I used the periosteal elevators to expose the underlying calvarium. We used towel clamps, rubber bands, and Allises for exposure. I then used a high-speed drill to create 2 bur holes in the temporal region overlying the tumor. I used a foot plate device to  create a craniotomy flap. We elevated the craniotomy flap with Gaspar Garbe number ones exposing the dura. I confirmed the placement of the craniotomy with the BrainLab probe. We incised the dura with a 15 blade scalpel and the Metzenbaum scissors. I suction, irrigation, electrocautery to create a small corticotomy in the anterior right temporal lobe. We quickly encountered a large cystic cavity. I used the Educational psychologist for exposure. We brought the operative microscope into the field. Under the medication and illumination the microscope we completed the microdissection. I inspected the cyst cavity. As expected there were 2 lesions. One small lesion lateral and one larger medial. The lesions were quite distinct from the surrounding brain. I was able to use cautery and suction to sector round the lesions and obtain a gross total resection of both lesions. The larger more medial lesion did involve the sylvian fissure vessels. Using micro-dissection was able to dissected the tumor away from the vessels. He sent a specimen to the pathologist and the came back consistent with a metastasis on preliminary diagnosis. We then obtained hemostasis using bipolar electrocautery. I lined the resection cavities with Surgicel. We removed the retractors. I reapproximated the dura with interrupted and running 4-0 Nurolon suture. I then lay some Gelfoam over the exposed dura and replaced the craniotomy flap with titanium mini plates and screws. We then reapproximated patient's temporalis fascia and muscle with interrupted 2-0 Vicryl suture. We reapproximated the subcutaneous tissue/galea with interrupted 2-0 Vicryl suture. We reapproximated the skin with stainless steel staples. The wound was then coated with bacitracin ointment. A sterile dressing was applied. The drapes were removed. I then removed the Mayfield 3 point headrest from the patient's calvarium. The  patient was subsequently extubated by the anesthesia team and  transported to the post anesthesia care unit in stable condition. By report all sponge, instrument, and needle counts were correct at the end of this case.

## 2015-06-09 NOTE — Anesthesia Procedure Notes (Addendum)
Central Venous Catheter Insertion Performed by: anesthesiologist Patient location: Pre-op. Preanesthetic checklist: patient identified, IV checked, site marked, risks and benefits discussed, surgical consent, monitors and equipment checked, pre-op evaluation, timeout performed and anesthesia consent Position: Trendelenburg Landmarks identified Catheter size: 8 Fr Central line was placed.Double lumen Procedure performed without using ultrasound guided technique. Attempts: 1 Following insertion, dressing applied, line sutured and Biopatch. Post procedure assessment: blood return through all ports, free fluid flow and no air. Patient tolerated the procedure well with no immediate complications.   Procedure Name: Intubation Date/Time: 06/09/2015 11:37 AM Performed by: Carney Living Pre-anesthesia Checklist: Patient identified, Emergency Drugs available, Suction available, Patient being monitored and Timeout performed Patient Re-evaluated:Patient Re-evaluated prior to inductionOxygen Delivery Method: Circle system utilized Preoxygenation: Pre-oxygenation with 100% oxygen Intubation Type: IV induction Ventilation: Mask ventilation without difficulty Laryngoscope Size: Mac and 4 Grade View: Grade I Tube type: Subglottic suction tube Tube size: 7.5 mm Number of attempts: 1 Airway Equipment and Method: Stylet Placement Confirmation: ETT inserted through vocal cords under direct vision,  positive ETCO2 and breath sounds checked- equal and bilateral Secured at: 22 cm Tube secured with: Tape Dental Injury: Teeth and Oropharynx as per pre-operative assessment

## 2015-06-09 NOTE — Transfer of Care (Signed)
Immediate Anesthesia Transfer of Care Note  Patient: Margaret Hale  Procedure(s) Performed: Procedure(s) with comments: CRANIOTOMY TUMOR EXCISION WITH APPLICATION OF CRANIAL NAVIGATION (Right) - Right temporal craniotomy for resection of tumor  Patient Location: PACU  Anesthesia Type:General  Level of Consciousness: awake, alert , oriented, patient cooperative and responds to stimulation  Airway & Oxygen Therapy: Patient Spontanous Breathing and Patient connected to nasal cannula oxygen  Post-op Assessment: Report given to RN, Post -op Vital signs reviewed and stable, Patient moving all extremities X 4 and Patient able to stick tongue midline  Post vital signs: Reviewed and stable  Last Vitals:  Filed Vitals:   06/09/15 1022  BP: 140/73  Pulse: 48  Temp: 36.1 C  Resp: 18    Complications: No apparent anesthesia complications

## 2015-06-09 NOTE — Progress Notes (Signed)
Subjective:  The patient is alert and pleasant. She looks well. She is in no apparent distress.  Objective: Vital signs in last 24 hours: Temp:  [97 F (36.1 C)] 97 F (36.1 C) (01/11 1022) Pulse Rate:  [48] 48 (01/11 1022) Resp:  [18] 18 (01/11 1022) BP: (140)/(73) 140/73 mmHg (01/11 1022) SpO2:  [100 %] 100 % (01/11 1022) Weight:  [74.801 kg (164 lb 14.5 oz)] 74.801 kg (164 lb 14.5 oz) (01/11 1022)  Intake/Output from previous day:   Intake/Output this shift: Total I/O In: 1200 [I.V.:1200] Out: 300 [Urine:200; Blood:100]  Physical exam the patient is alert and pleasant. Her speech is normal. Her strength is normal she looks great. Her dressing is clean and dry.  Lab Results:  Recent Labs  06/07/15 0948  WBC 5.7  HGB 14.6  HCT 43.8  PLT 186   BMET  Recent Labs  06/07/15 0948  NA 140  K 4.2  CL 104  CO2 27  GLUCOSE 115*  BUN 14  CREATININE 0.66  CALCIUM 9.2    Studies/Results: Mr Kizzie Fantasia Contrast  06/08/2015  CLINICAL DATA:  81 year old female study for stereotactic surgical planning. Right temporal lobe mass. Subsequent encounter. EXAM: MRI HEAD WITHOUT AND WITH CONTRAST TECHNIQUE: Multiplanar, multiecho pulse sequences of the brain and surrounding structures were obtained without and with intravenous contrast. CONTRAST:  12m MULTIHANCE GADOBENATE DIMEGLUMINE 529 MG/ML IV SOLN COMPARISON:  Brain MRI 04/29/2015 and earlier. FINDINGS: Predominantly cystic intra-axial right hemisphere mass re- demonstrated and increased in size since 04/29/2015. Current size is 63 x 43 x 44 mm, versus 50 x 40 x 41 mm at a comparable level previously (AP by transverse by CC). Both the larger solid enhancing nodular component along the anterior superior aspect of the mass, and the smaller inferolateral nodule have also mildly increased in size, the larger now up to 22 mm greatest dimension. There is associated restricted diffusion (series 4, image 14) indicating hypercellularity in  this setting. No associated tumoral hemorrhage or mineralization. Associated mildly increased intracranial mass effect and effacement of the right lateral ventricle. Leftward midline shift now is up to 5 mm (previously 2-3 mm). No ventriculomegaly. No acute intracranial hemorrhage identified. Stable chronic micro hemorrhage in the left temporal lobe. Widespread bilateral nonspecific cerebral white matter T2 and FLAIR hyperintensity has not significantly changed, including along the margin of the right temporal lobe mass. No other abnormal intracranial enhancement. No dural thickening identified. Basilar cisterns remain patent. Negative pituitary, cervicomedullary junction and visualized cervical spine. Major intracranial vascular flow voids are stable. Note the close proximity of the right MCA bifurcation and branches to the mass. No restricted diffusion or evidence of acute infarction. Normal bone marrow signal. Stable orbit and scalp soft tissues. Paranasal sinuses and mastoids are stable and largely clear. IMPRESSION: 1. Mildly enlarged predominantly cystic right temporal lobe intra-axial mass, now up to 6.3 cm. Two solidly enhancing tumoral nodules which appear hypercellular also have mildly increased. Note the proximity of the Right MCA to the mass and the larger tumor nodule. 2. Mildly increased intracranial mass effect with 4 mm of leftward midline shift. Increased effacement of the right lateral ventricle with no ventriculomegaly. 3. Stable nonspecific surrounding and widespread bilateral cerebral white matter T2 and FLAIR hyperintensity. 4. No new intracranial abnormality. Electronically Signed   By: HGenevie AnnM.D.   On: 06/08/2015 19:17    Assessment/Plan: The patient is doing well.  LOS: 0 days     Johonna Binette D 06/09/2015, 3:08  PM       

## 2015-06-09 NOTE — Anesthesia Preprocedure Evaluation (Addendum)
Anesthesia Evaluation  Patient identified by MRN, date of birth, ID band Patient awake    Reviewed: Allergy & Precautions, NPO status , Patient's Chart, lab work & pertinent test results, reviewed documented beta blocker date and time   History of Anesthesia Complications Negative for: history of anesthetic complications  Airway Mallampati: II  TM Distance: >3 FB Neck ROM: Full    Dental  (+) Upper Dentures, Partial Lower, Dental Advisory Given   Pulmonary COPD, former smoker,    breath sounds clear to auscultation       Cardiovascular hypertension, Pt. on medications and Pt. on home beta blockers + CAD and + Cardiac Stents   Rhythm:Regular Rate:Bradycardia     Neuro/Psych PSYCHIATRIC DISORDERS Anxiety Right temporal brain tumor     GI/Hepatic negative GI ROS, Neg liver ROS,   Endo/Other  negative endocrine ROS  Renal/GU negative Renal ROS     Musculoskeletal negative musculoskeletal ROS (+)   Abdominal   Peds  Hematology negative hematology ROS (+)   Anesthesia Other Findings   Reproductive/Obstetrics                           Anesthesia Physical Anesthesia Plan  ASA: III  Anesthesia Plan: General   Post-op Pain Management:    Induction: Intravenous  Airway Management Planned: Oral ETT  Additional Equipment: Arterial line and CVP  Intra-op Plan:   Post-operative Plan: Extubation in OR and Possible Post-op intubation/ventilation  Informed Consent: I have reviewed the patients History and Physical, chart, labs and discussed the procedure including the risks, benefits and alternatives for the proposed anesthesia with the patient or authorized representative who has indicated his/her understanding and acceptance.   Dental advisory given  Plan Discussed with: CRNA, Surgeon and Anesthesiologist  Anesthesia Plan Comments:        Anesthesia Quick Evaluation

## 2015-06-09 NOTE — H&P (Signed)
Subjective: The patient is a 81 year old white female immigrant from [place] who was diagnosed with a right temporal brain lesion in the process of being worked up for an abnormal chest x-rays. I discussed the situation with the patient and her son. We discussed the various treatment options including surgery. She has decided to proceed with a right temporal craniotomy for resection of the brain tumor   Past Medical History  Diagnosis Date  . Hyperlipidemia   . CAD (coronary artery disease) 2007    PCI of RCA, bare metal stent.....Dr. Radford Pax  . Thyroid disease     nodules,stable on u/s 2/12  . Baker's cyst     L KNEE  . Vitamin D deficiency   . Disc disorder of lumbar region     2 BULDGING DISCS DX 1990 BY DR. DALLDORF  . No pertinent past medical history     lung mass  occas cough   . Dizzy   . Hypertension     Takes Novasc ,Clonidine, & Losartan  . Cancer (HCC)     lung mass  . Anxiety     Past Surgical History  Procedure Laterality Date  . Carpal tunnel release  06/26/08        04/20/09    RIGHT HAND          LEFT HAND  . Finger contracture release  8/12    Dr. Amedeo Plenty    right hand trigger finger release  . Eye surgery  01/07/09    LEFT CATARACT  . Eye surgery  01/21/09    RIGHT CATARACT  . Coronary angioplasty      dr h Tamala Julian [phone] with one stent  . Mediastinoscopy  08/02/2011    Procedure: MEDIASTINOSCOPY;  Surgeon: Nicanor Alcon, MD;  Location: [facility] OR;  Service: Thoracic;  Laterality: N/A;    Allergies  Allergen Reactions  . Food     Sausage, hot dogs, salami (rash)  . Lipitor [Atorvastatin Calcium] Other (See Comments)    MYALGIAS  . Pantoprazole Sodium Rash    Social History  Substance Use Topics  . Smoking status: Former Smoker -- 0.50 packs/day for 10 years    Types: Cigarettes    Quit date: 07/12/2005  . Smokeless tobacco: Never Used  . Alcohol Use: No    Family History  Problem Relation Age of Onset  . Anesthesia problems Neg Hx   . Hypotension  Neg Hx   . Malignant hyperthermia Neg Hx   . Pseudochol deficiency Neg Hx    Prior to Admission medications   Medication Sig Start Date End Date Taking? Authorizing Provider  amLODipine (NORVASC) 10 MG tablet Take 10 mg by mouth every evening.    Yes Historical Provider, MD  metoprolol succinate (TOPROL-XL) 100 MG 24 hr tablet Take 100 mg by mouth daily. Take with or immediately following a meal.   Yes Historical Provider, MD  rOPINIRole (REQUIP) 0.5 MG tablet Take 1 tablet (0.5 mg total) by mouth at bedtime. 02/11/14  Yes Sueanne Margarita, MD  valsartan (DIOVAN) 320 MG tablet Take 320 mg by mouth daily.   Yes Historical Provider, MD     Review of Systems  Positive ROS: As above  All other systems have been reviewed and were otherwise negative with the exception of those mentioned in the HPI and as above.  Objective: Vital signs in last 24 hours: Temp:  [97 F (36.1 C)] 97 F (36.1 C) (01/11 1022) Pulse Rate:  [48] 48 (01/11 1022)  Resp:  [18] 18 (01/11 1022) BP: (140)/(73) 140/73 mmHg (01/11 1022) SpO2:  [100 %] 100 % (01/11 1022) Weight:  [74.801 kg (164 lb 14.5 oz)] 74.801 kg (164 lb 14.5 oz) (01/11 1022)  General Appearance: Alert, cooperative, no distress, Head: Normocephalic, without obvious abnormality, atraumatic Eyes: PERRL, conjunctiva/corneas clear, EOM's intact,    Ears: Normal  Throat: Normal  Neck: Supple, symmetrical, trachea midline, no adenopathy; thyroid: No enlargement/tenderness/nodules; no carotid bruit or JVD Back: Symmetric, no curvature, ROM normal, no CVA tenderness Lungs: Clear to auscultation bilaterally, respirations unlabored Heart: Regular rate and rhythm, no murmur, rub or gallop Abdomen: Soft, non-tender,, no masses, no organomegaly Extremities: Extremities normal, atraumatic, no cyanosis or edema Pulses: 2+ and symmetric all extremities Skin: Skin color, texture, turgor normal, no rashes or lesions  NEUROLOGIC:   Mental status: alert and  oriented, no aphasia, good attention span, Fund of knowledge/ memory ok Motor Exam - grossly normal Sensory Exam - grossly normal Reflexes:  Coordination - grossly normal Gait - grossly normal Balance - grossly normal Cranial Nerves: I: smell Not tested  II: visual acuity  OS: Normal  OD: Normal   II: visual fields Full to confrontation  II: pupils Equal, round, reactive to light  III,VII: ptosis None  III,IV,VI: extraocular muscles  Full ROM  V: mastication Normal  V: facial light touch sensation  Normal  V,VII: corneal reflex  Present  VII: facial muscle function - upper  Normal  VII: facial muscle function - lower Normal  VIII: hearing Not tested  IX: soft palate elevation  Normal  IX,X: gag reflex Present  XI: trapezius strength  5/5  XI: sternocleidomastoid strength 5/5  XI: neck flexion strength  5/5  XII: tongue strength  Normal    Data Review Lab Results  Component Value Date   WBC 5.7 06/07/2015   HGB 14.6 06/07/2015   HCT 43.8 06/07/2015   MCV 90.9 06/07/2015   PLT 186 06/07/2015   Lab Results  Component Value Date   NA 140 06/07/2015   K 4.2 06/07/2015   CL 104 06/07/2015   CO2 27 06/07/2015   BUN 14 06/07/2015   CREATININE 0.66 06/07/2015   GLUCOSE 115* 06/07/2015   Lab Results  Component Value Date   INR 0.98 08/01/2011    Assessment/Plan: Right temporal brain tumor: I have discussed the situation with the patient and her son. I have reviewed the brain MRI with him and pointed out the abnormalities. We have discussed the various treatment options including surgery. I have described the surgical treatment option of a right temporal craniotomy for resection of the tumor. I described the surgery to her. We have discussed the risks, benefits, alternatives, and likelihood of achieving her goals with surgery. I advanced all the patient's questions. She has decided to proceed with surgery.   Rayna Brenner D 06/09/2015 10:46 AM

## 2015-06-10 ENCOUNTER — Inpatient Hospital Stay (HOSPITAL_COMMUNITY): Payer: Medicare Other

## 2015-06-10 ENCOUNTER — Encounter (HOSPITAL_COMMUNITY): Payer: Self-pay | Admitting: Neurosurgery

## 2015-06-10 MED ORDER — DOCUSATE SODIUM 100 MG PO CAPS: 100.0000 mg | ORAL_CAPSULE | Freq: Two times a day (BID) | ORAL | Status: DC

## 2015-06-10 MED ORDER — HYDROCODONE-ACETAMINOPHEN 5-325 MG PO TABS: 1.0000 | ORAL_TABLET | ORAL | Status: DC | PRN

## 2015-06-10 MED ORDER — GADOBENATE DIMEGLUMINE 529 MG/ML IV SOLN
16.0000 mL | Freq: Once | INTRAVENOUS | Status: AC | PRN
  Administered 2015-06-10: 16 mL via INTRAVENOUS

## 2015-06-10 MED ORDER — DEXAMETHASONE 4 MG PO TABS
4.0000 mg | ORAL_TABLET | Freq: Three times a day (TID) | ORAL | Status: DC
  Administered 2015-06-10: 4 mg via ORAL
  Filled 2015-06-10: qty 1

## 2015-06-10 MED ORDER — DEXAMETHASONE 4 MG PO TABS: 4.0000 mg | ORAL_TABLET | Freq: Three times a day (TID) | ORAL | Status: DC

## 2015-06-10 NOTE — Care Management Note (Signed)
Case Management Note  Patient Details  Name: Timberlee Roblero MRN: [id number] Date of Birth: 1934/06/10  Subjective/Objective:    Pt admitted on 06/09/15 s/p craniotomy for tumor resection.  PTA, pt independent of ADLS.                  Action/Plan: Pt for dc home today; has supportive son.  No dc needs identified.     Expected Discharge Date:   06/10/15               Expected Discharge Plan:  Home/Self Care  In-House Referral:     Discharge planning Services  CM Consult  Post Acute Care Choice:    Choice offered to:     DME Arranged:    DME Agency:     HH Arranged:    HH Agency:     Status of Service:  Completed, signed off  Medicare Important Message Given:    Date Medicare IM Given:    Medicare IM give by:    Date Additional Medicare IM Given:    Additional Medicare Important Message give by:     If discussed at [place] of Stay Meetings, dates discussed:    Additional Comments:  Reinaldo Raddle, RN, BSN  Trauma/Neuro ICU Case Manager [phone]

## 2015-06-10 NOTE — Anesthesia Postprocedure Evaluation (Signed)
Anesthesia Post Note  Patient: Personal assistant  Procedure(s) Performed: Procedure(s) (LRB): CRANIOTOMY TUMOR EXCISION WITH APPLICATION OF CRANIAL NAVIGATION (Right)  Patient location during evaluation: PACU Anesthesia Type: General Level of consciousness: awake Pain management: pain level controlled Vital Signs Assessment: post-procedure vital signs reviewed and stable Respiratory status: respiratory function stable Cardiovascular status: stable Postop Assessment: no signs of nausea or vomiting Anesthetic complications: no    Last Vitals:  Filed Vitals:   06/10/15 1547 06/10/15 1600  BP:  149/62  Pulse:    Temp: 37.1 C   Resp:      Last Pain:  Filed Vitals:   06/10/15 1646  PainSc: 0-No pain                 Clydell Alberts

## 2015-06-10 NOTE — Progress Notes (Signed)
Utilization review completed. [place] Giammona, RN, BSN. 

## 2015-06-10 NOTE — Progress Notes (Addendum)
Pt discharged to the discharge lounge via RN and wheelchair. All patient education and discharge instructions were explained to the patient and she verbalized understanding. Time for questions and answers was given and the patient had no more questions. Neuro exam upon assessment was unchanged. All VS stable. All belongings returned to pt. Patient's son to pick patient up soon as per patient.

## 2015-06-10 NOTE — Discharge Summary (Signed)
Physician Discharge Summary  Patient ID: Margaret Hale MRN: [id number] DOB/AGE: 09-14-1934 81 y.o.  Admit date: 06/09/2015 Discharge date: 06/10/2015  Admission Diagnoses: Right temporal brain tumors  Discharge Diagnoses: The same Active Problems:   S/P craniotomy   Brain tumor [facility])   Discharged Condition: good  Hospital Course: I performed a right frontotemporoparietal craniotomy for resection of brain tumors using microdissection and Stealth neuro navigation. The surgery went well. The pulmonary diagnosis is consistent with metastasis.  The patient's postoperative course was unremarkable. On postoperative day #1 the patient requested discharge home. The patient was given written and oral discharge instructions. She was instructed to follow with me next week for staple removal and follow-up of her pathology. All her questions were answered. The plan is to send her home after she gets her postoperative brain MRI.   Consults: None Significant Diagnostic Studies: Brain MRI Treatments: Right frontotemporal parietal craniotomy for gross total resection of brain metastasis using neuronavigation and microdissection. Discharge Exam: Blood pressure 149/74, pulse 70, temperature 99.9 F (37.7 C), temperature source Oral, resp. rate 19, height '5\' 4"'$  (1.626 m), weight 74.801 kg (164 lb 14.5 oz), SpO2 92 %. The patient is alert and oriented 3. Her strength is normal in all 4 extremities. Her speech is normal. Her dressing is clean and dry.  Disposition: Home  Discharge Instructions    Call MD for:  difficulty breathing, headache or visual disturbances    Complete by:  As directed      Call MD for:  extreme fatigue    Complete by:  As directed      Call MD for:  hives    Complete by:  As directed      Call MD for:  persistant dizziness or light-headedness    Complete by:  As directed      Call MD for:  persistant nausea and vomiting    Complete by:  As directed      Call MD for:   redness, tenderness, or signs of infection (pain, swelling, redness, odor or green/yellow discharge around incision site)    Complete by:  As directed      Call MD for:  severe uncontrolled pain    Complete by:  As directed      Call MD for:  temperature >100.4    Complete by:  As directed      Diet - low sodium heart healthy    Complete by:  As directed      Discharge instructions    Complete by:  As directed   Call [phone] for a followup appointment. Take a stool softener while you are using pain medications.     Driving Restrictions    Complete by:  As directed   Do not drive for 2 weeks.     Increase activity slowly    Complete by:  As directed      Lifting restrictions    Complete by:  As directed   Do not lift more than 5 pounds. No excessive bending or twisting.     May shower / Bathe    Complete by:  As directed   He may shower after the pain she is removed 3 days after surgery. Leave the incision alone.     Remove dressing in 48 hours    Complete by:  As directed   Your stitches are under the scan and will dissolve by themselves. The Steri-Strips will fall off after you take a few showers. Do not rub  back or pick at the wound, Leave the wound alone.            Medication List    TAKE these medications        amLODipine 10 MG tablet  Commonly known as:  NORVASC  Take 10 mg by mouth every evening.     dexamethasone 4 MG tablet  Commonly known as:  DECADRON  Take 1 tablet (4 mg total) by mouth every 8 (eight) hours.     docusate sodium 100 MG capsule  Commonly known as:  COLACE  Take 1 capsule (100 mg total) by mouth 2 (two) times daily.     HYDROcodone-acetaminophen 5-325 MG tablet  Commonly known as:  NORCO/VICODIN  Take 1 tablet by mouth every 4 (four) hours as needed for moderate pain.     metoprolol succinate 100 MG 24 hr tablet  Commonly known as:  TOPROL-XL  Take 100 mg by mouth daily. Take with or immediately following a meal.     rOPINIRole 0.5  MG tablet  Commonly known as:  REQUIP  Take 1 tablet (0.5 mg total) by mouth at bedtime.     valsartan 320 MG tablet  Commonly known as:  DIOVAN  Take 320 mg by mouth daily.         SignedNewman Pies D 06/10/2015, 11:06 AM

## 2015-06-24 ENCOUNTER — Telehealth: Payer: Self-pay | Admitting: *Deleted

## 2015-06-24 NOTE — Telephone Encounter (Signed)
Oncology Nurse Navigator Documentation  Oncology Nurse Navigator Flowsheets 06/24/2015  Navigator Location [facility]  Navigator Encounter Type Telephone/I received referral on Margaret Hale today.  I clarified with Dr. Servando Snare on next steps. He would like patient to be seen by Dr. Julien Nordmann.  I called patient to schedule but she was unable to make the appt tomorrow. I stated I or HIM team member will call her back with appt  Treatment Phase Pre-Tx/Tx Discussion  Barriers/Navigation Needs Coordination of Care  Interventions Coordination of Care  Coordination of Care Appts  Acuity Level 1  Time Spent with Patient 15

## 2015-06-25 ENCOUNTER — Telehealth: Payer: Self-pay | Admitting: Internal Medicine

## 2015-06-25 NOTE — Telephone Encounter (Signed)
PT AWARE OF NP APPT. ON 07/05/15'@1'$ :45 DX-LUNG MASS

## 2015-06-28 ENCOUNTER — Telehealth: Payer: Self-pay | Admitting: *Deleted

## 2015-06-28 NOTE — Telephone Encounter (Signed)
Message from pt's son asking to follow up on referral to Dr. Benay Spice. Returned call, he is aware of 2/6 visit with Dr. Julien Nordmann. PCP referred to Dr. Julien Nordmann. Dr. Arnoldo Morale referred to [place]. Pt and son would like to meet with both. Will review with provider. Records to Dr. Benay Spice for review.

## 2015-06-29 ENCOUNTER — Other Ambulatory Visit: Payer: Self-pay

## 2015-06-29 DIAGNOSIS — Z1231 Encounter for screening mammogram for malignant neoplasm of breast: Secondary | ICD-10-CM

## 2015-07-01 NOTE — Telephone Encounter (Signed)
Per Dr. Benay Spice: Pt to keep appt with Dr. Julien Nordmann as scheduled. If she wants second opinion after that visit, Dr. Julien Nordmann will arrange for it. No answer at pt's #, called son , Ronalee Belts with this information. He voiced understanding.

## 2015-07-05 ENCOUNTER — Encounter: Payer: Self-pay | Admitting: Internal Medicine

## 2015-07-05 ENCOUNTER — Ambulatory Visit (HOSPITAL_BASED_OUTPATIENT_CLINIC_OR_DEPARTMENT_OTHER): Payer: Medicare Other | Admitting: Internal Medicine

## 2015-07-05 ENCOUNTER — Telehealth: Payer: Self-pay | Admitting: Internal Medicine

## 2015-07-05 ENCOUNTER — Encounter: Payer: Self-pay | Admitting: *Deleted

## 2015-07-05 ENCOUNTER — Other Ambulatory Visit (HOSPITAL_BASED_OUTPATIENT_CLINIC_OR_DEPARTMENT_OTHER): Payer: Medicare Other

## 2015-07-05 VITALS — BP 163/69 | HR 54 | Temp 98.0°F | Resp 18 | Ht 64.0 in | Wt 173.6 lb

## 2015-07-05 DIAGNOSIS — C7A1 Malignant poorly differentiated neuroendocrine tumors: Secondary | ICD-10-CM

## 2015-07-05 DIAGNOSIS — J449 Chronic obstructive pulmonary disease, unspecified: Secondary | ICD-10-CM | POA: Diagnosis not present

## 2015-07-05 DIAGNOSIS — C7B8 Other secondary neuroendocrine tumors: Secondary | ICD-10-CM

## 2015-07-05 DIAGNOSIS — E041 Nontoxic single thyroid nodule: Secondary | ICD-10-CM | POA: Diagnosis not present

## 2015-07-05 DIAGNOSIS — I10 Essential (primary) hypertension: Secondary | ICD-10-CM

## 2015-07-05 DIAGNOSIS — Z87891 Personal history of nicotine dependence: Secondary | ICD-10-CM

## 2015-07-05 DIAGNOSIS — C3491 Malignant neoplasm of unspecified part of right bronchus or lung: Secondary | ICD-10-CM

## 2015-07-05 NOTE — Telephone Encounter (Signed)
per pof to sch pt appt-adv [facility] will call to sch scan

## 2015-07-05 NOTE — Progress Notes (Signed)
Oncology Nurse Navigator Documentation  Oncology Nurse Navigator Flowsheets 07/05/2015  Navigator Encounter Type Clinic/MDC/spoke with patient and son today at clinic.  Patient has dx of stage IV lung cancer with resection brain met.  Patient was given treatment plan from Dr. Julien Nordmann.  Questions and concerns were addressed.   Per Dr. Julien Nordmann, I notified pathology dept at [facility] to send tissue from craniotomy for PDL1 Keytruda.    Patient Visit Type Initial  Treatment Phase Pre-Tx/Tx Discussion  Barriers/Navigation Needs Coordination of Care  Acuity Level 1  Time Spent with Patient 30

## 2015-07-05 NOTE — Progress Notes (Signed)
[place] [phone]   Fax:([phone]) [phone]  CONSULT NOTE  REFERRING PHYSICIAN: Dr. Lanelle Bal  REASON FOR CONSULTATION:  81 years old white female recently diagnosed with lung cancer.  HPI Margaret Hale is a 81 y.o. female with past medical history significant for coronary artery disease status post stent placement, COPD, hypertension, dyslipidemia, thyroid nodules as well as restless leg syndrome and remote history of smoking. The patient mentions that in January 2013 she had left-sided chest pain and chest x-ray performed at that time showed pulmonary nodules. This was followed by CT scan of the chest on 06/29/2011 and it showed new lobulated lingular nodule abutting the major fissure highly suspicious for bronchogenic carcinoma. There was also newly enlarged lymph node in the AP window concerning for possible metastatic disease. A PET scan was performed on 07/07/2011 and it showed lingular and right middle lobe hypermetabolic nodules with hypermetabolic hilar, right infra hilar and mediastinal adenopathy compatible with malignancy or active granulomatous disease. There was also a hypermetabolic left sixth rib lesion with underlying fracture. MRI of the brain at that time showed no visible metastatic disease to the brain or meninges. The patient was seen at that time by Dr. [name] and she underwent fiberoptic bronchoscopy with endobronchial ultrasound and biopsies. The final cytology from the bronchoscopy as well as transbronchial ultrasound and mediastinal lymph node biopsies were negative for malignancy. On 08/03/2011 the patient underwent mediastinoscopy with endobronchial ultrasound and bronchoscopy. Again the final cytology from the mediastinal lymph nodes were negative for malignancy and showed granulomatous disease. The patient was followed by observation and repeat CT scan of the chest at regular basis showed stable pulmonary nodules as well as  mediastinal lymphadenopathy. CT scan of the chest without contrast on 03/24/2015 showed multiple solid and partially solid nodules identified within post lungs. Within the right upper lobe, there was a port solid nodule which had increased in size from the previous exam. It measured 1.5 cm and has central solid component of 6 mm. This is worrisome for a low-grade pulmonary adenocarcinoma. The other small nodules are unchanged. A PET scan on 04/07/2015 showed generally similar appearance of the hypermetabolic thoracic adenopathy and the scattered hypermetabolic pulmonary nodules compared to the 2013 prior PET-CT. There is a new right upper lobe hypermetabolic nodule since that time and there was also interval resolution of her right middle lobe hypermetabolic nodules in that time. The PET scan also showed new 4.2 x 3.1 cm hypodense and hypometabolic lesion in the right temporal lobe of the brain. This was followed by MRI of the brain on 04/29/2015 and it showed an intra-axial mass in the right temporal lobe which is new from the prior MRI. The mass measures 5.6 x 4.1 x 3.9 cm and is largely cystic with thin peripheral enhancement. However, there are 2 enhancing mural soft tissue nodules. The largest measures 1.9 x 1.0 cm along the anterosuperior aspect of the lesion, while the smaller measures 8 mm along the inferolateral aspect. There is very mild surrounding edema which slightly extends into the internal and external capsules. Minimal leftward midline shift measures 2-3 mm. No other enhancing brain lesions are seen. On 06/09/2015 the patient underwent right frontal temporal parietal craniotomy for resection of brain tumors under the care of Dr. Earle Gell.  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  The immunohistochemical stains showed the malignant cells are TTF-1 - strong diffue  positivity.  CD56 - strong diffuse expression. Cytokeratin AE1/AE3 - diffuse moderate strong expression. Chromogranin - patchy moderate strong expression. GFAP - negative expression.Synaptophysin - strong diffuse expression.S100 - negative expression The morphology and immunophenotype are consistent with high grade poorly differentiated carcinoma with neuroendocrine differentiation consistent with lumg primary. The patient was referred to me today for further evaluation and recommendation regarding treatment of her condition. When seen today she is recovering well from her surgery. She denied having any significant chest pain, shortness of breath but has occasional cough with no hemoptysis. She lost few pounds recently and has occasional sweats. She denied having any headache or visual changes. The patient denied having any significant nausea, vomiting, diarrhea or constipation. Family history significant for mother who died from pyelonephritis at age 33. Father with unknown medical history. The patient is a widow and has 2 sons. She was accompanied today by her son might. She works as a Photographer. She has remote history of smoking 0.5 pack per day for around 10 years and quit in 2007. No history of alcohol or drug abuse.    HPI  Past Medical History  Diagnosis Date  . Hyperlipidemia   . CAD (coronary artery disease) 2007    PCI of RCA, bare metal stent.....Dr. Radford Pax  . Thyroid disease     nodules,stable on u/s 2/12  . Baker's cyst     L KNEE  . Vitamin D deficiency   . Disc disorder of lumbar region     2 BULDGING DISCS DX 1990 BY DR. DALLDORF  . No pertinent past medical history     lung mass  occas cough   . Dizzy   . Hypertension     Takes Novasc ,Clonidine, & Losartan  . Cancer (HCC)     lung mass  . Anxiety     Past Surgical History  Procedure Laterality Date  . Carpal tunnel release  06/26/08        04/20/09    RIGHT HAND          LEFT HAND  . Finger contracture release  8/12     Dr. Amedeo Plenty    right hand trigger finger release  . Eye surgery  01/07/09    LEFT CATARACT  . Eye surgery  01/21/09    RIGHT CATARACT  . Coronary angioplasty      dr h Tamala Julian [phone] with one stent  . Mediastinoscopy  08/02/2011    Procedure: MEDIASTINOSCOPY;  Surgeon: Nicanor Alcon, MD;  Location: [facility] OR;  Service: Thoracic;  Laterality: N/A;  . Craniotomy Right 06/09/2015    Procedure: CRANIOTOMY TUMOR EXCISION WITH APPLICATION OF CRANIAL NAVIGATION;  Surgeon: Newman Pies, MD;  Location: [facility];  Service: Neurosurgery;  Laterality: Right;  Right temporal craniotomy for resection of tumor    Family History  Problem Relation Age of Onset  . Anesthesia problems Neg Hx   . Hypotension Neg Hx   . Malignant hyperthermia Neg Hx   . Pseudochol deficiency Neg Hx     Social History Social History  Substance Use Topics  . Smoking status: Former Smoker -- 0.50 packs/day for 10 years    Types: Cigarettes    Quit date: 07/12/2005  . Smokeless tobacco: Never Used  . Alcohol Use: No    Allergies  Allergen Reactions  . Food     Sausage, hot dogs, salami (rash)  . Lipitor [Atorvastatin Calcium] Other (See Comments)    MYALGIAS  . Pantoprazole Sodium Rash    Current  Outpatient Prescriptions  Medication Sig Dispense Refill  . amLODipine (NORVASC) 10 MG tablet Take 10 mg by mouth every evening.     . hydrochlorothiazide (HYDRODIURIL) 25 MG tablet Take 25 mg by mouth daily.    . metoprolol succinate (TOPROL-XL) 100 MG 24 hr tablet Take 100 mg by mouth daily. Take with or immediately following a meal.    . rOPINIRole (REQUIP) 1 MG tablet Take 1 mg by mouth at bedtime.    . valsartan (DIOVAN) 320 MG tablet Take 320 mg by mouth daily.    Marland Kitchen docusate sodium (COLACE) 100 MG capsule Take 1 capsule (100 mg total) by mouth 2 (two) times daily. (Patient not taking: Reported on 07/05/2015) 60 capsule 0  . HYDROcodone-acetaminophen (NORCO/VICODIN) 5-325 MG tablet Take 1 tablet by mouth every 4  (four) hours as needed for moderate pain. (Patient not taking: Reported on 07/05/2015) 50 tablet 0   No current facility-administered medications for this visit.    Review of Systems  Constitutional: positive for night sweats and weight loss Eyes: negative Ears, nose, mouth, throat, and face: negative Respiratory: positive for cough Cardiovascular: negative Gastrointestinal: negative Genitourinary:negative Integument/breast: negative Hematologic/lymphatic: negative Musculoskeletal:negative Neurological: negative Behavioral/Psych: negative Endocrine: negative Allergic/Immunologic: negative  Physical Exam  BJS:EGBTD, healthy, no distress, well nourished and well developed SKIN: skin color, texture, turgor are normal, no rashes or significant lesions HEAD: Normocephalic, No masses, lesions, tenderness or abnormalities EYES: normal, PERRLA, Conjunctiva are pink and non-injected EARS: External ears normal, Canals clear OROPHARYNX:no exudate, no erythema and lips, buccal mucosa, and tongue normal  NECK: supple, no adenopathy, no JVD LYMPH:  no palpable lymphadenopathy, no hepatosplenomegaly BREAST:left breast normal without mass, skin or nipple changes or axillary nodes LUNGS: clear to auscultation , and palpation HEART: regular rate & rhythm, no murmurs and no gallops ABDOMEN:abdomen soft, non-tender, normal bowel sounds and no masses or organomegaly BACK: Back symmetric, no curvature., No CVA tenderness EXTREMITIES:no joint deformities, effusion, or inflammation, no edema, no skin discoloration  NEURO: alert & oriented x 3 with fluent speech, no focal motor/sensory deficits  PERFORMANCE STATUS: ECOG 1  LABORATORY DATA: Lab Results  Component Value Date   WBC 5.7 06/07/2015   HGB 14.6 06/07/2015   HCT 43.8 06/07/2015   MCV 90.9 06/07/2015   PLT 186 06/07/2015      Chemistry      Component Value Date/Time   NA 140 06/07/2015 0948   K 4.2 06/07/2015 0948   CL 104  06/07/2015 0948   CO2 27 06/07/2015 0948   BUN 14 06/07/2015 0948   CREATININE 0.66 06/07/2015 0948   CREATININE 0.66 07/13/2011 1714      Component Value Date/Time   CALCIUM 9.2 06/07/2015 0948   ALKPHOS 90 10/01/2014 1500   AST 18 10/01/2014 1500   ALT 20 10/01/2014 1500   BILITOT 0.6 10/01/2014 1500       RADIOGRAPHIC STUDIES: Mr Jeri Cos VV Contrast  2015-06-22  CLINICAL DATA:  Brain tumor post resection EXAM: MRI HEAD WITHOUT AND WITH CONTRAST TECHNIQUE: Multiplanar, multiecho pulse sequences of the brain and surrounding structures were obtained without and with intravenous contrast. CONTRAST:  4m MULTIHANCE GADOBENATE DIMEGLUMINE 529 MG/ML IV SOLN COMPARISON:  06/08/2015 FINDINGS: Calvarium and upper cervical spine: Right craniotomy with fluid and gas superficial and deep to the bone flap. The epidural collection mildly contributes to mass effect in the right cerebral hemisphere. Orbits: Negative. Sinuses and Mastoids: Clear. Brain: Interval partial collapse of the cavity in the right temporal lobe,  with the peripherally enhancing wall now crenulated. Previously seen 2 nodules are no longer visualized post resection. Collapsed cyst walls show fairly linear enhancement, best visualized on sagittal reformats. There is stable asymmetric white matter edematous signal in the right cerebral hemisphere consistent with vasogenic edema. There is superimposed chronic small vessel disease which is fairly extensive. No postoperative infarct or evidence of major vessel occlusion. No hydrocephalus or intraventricular hemorrhage. There is stable to decreased right to left midline shift measuring 4 mm. IMPRESSION: 1. No unexpected finding after right superior temporal tumor resection. 2. The cystic mass has been decompressed and 2 enhancing nodules are no longer seen. Peripheral enhancement is likely combination of postoperative change and mural tumor. Electronically Signed   By: Monte Fantasia M.D.   On:  06/10/2015 14:35   Mr Jeri Cos YQ Contrast  06/08/2015  CLINICAL DATA:  81 year old female study for stereotactic surgical planning. Right temporal lobe mass. Subsequent encounter. EXAM: MRI HEAD WITHOUT AND WITH CONTRAST TECHNIQUE: Multiplanar, multiecho pulse sequences of the brain and surrounding structures were obtained without and with intravenous contrast. CONTRAST:  49m MULTIHANCE GADOBENATE DIMEGLUMINE 529 MG/ML IV SOLN COMPARISON:  Brain MRI 04/29/2015 and earlier. FINDINGS: Predominantly cystic intra-axial right hemisphere mass re- demonstrated and increased in size since 04/29/2015. Current size is 63 x 43 x 44 mm, versus 50 x 40 x 41 mm at a comparable level previously (AP by transverse by CC). Both the larger solid enhancing nodular component along the anterior superior aspect of the mass, and the smaller inferolateral nodule have also mildly increased in size, the larger now up to 22 mm greatest dimension. There is associated restricted diffusion (series 4, image 14) indicating hypercellularity in this setting. No associated tumoral hemorrhage or mineralization. Associated mildly increased intracranial mass effect and effacement of the right lateral ventricle. Leftward midline shift now is up to 5 mm (previously 2-3 mm). No ventriculomegaly. No acute intracranial hemorrhage identified. Stable chronic micro hemorrhage in the left temporal lobe. Widespread bilateral nonspecific cerebral white matter T2 and FLAIR hyperintensity has not significantly changed, including along the margin of the right temporal lobe mass. No other abnormal intracranial enhancement. No dural thickening identified. Basilar cisterns remain patent. Negative pituitary, cervicomedullary junction and visualized cervical spine. Major intracranial vascular flow voids are stable. Note the close proximity of the right MCA bifurcation and branches to the mass. No restricted diffusion or evidence of acute infarction. Normal bone marrow  signal. Stable orbit and scalp soft tissues. Paranasal sinuses and mastoids are stable and largely clear. IMPRESSION: 1. Mildly enlarged predominantly cystic right temporal lobe intra-axial mass, now up to 6.3 cm. Two solidly enhancing tumoral nodules which appear hypercellular also have mildly increased. Note the proximity of the Right MCA to the mass and the larger tumor nodule. 2. Mildly increased intracranial mass effect with 4 mm of leftward midline shift. Increased effacement of the right lateral ventricle with no ventriculomegaly. 3. Stable nonspecific surrounding and widespread bilateral cerebral white matter T2 and FLAIR hyperintensity. 4. No new intracranial abnormality. Electronically Signed   By: HGenevie AnnM.D.   On: 06/08/2015 19:17   Dg Chest Port 1 View  06/09/2015  CLINICAL DATA:  Encounter for central line placement EXAM: PORTABLE CHEST 1 VIEW COMPARISON:  PET-CT 04/07/2015 FINDINGS: The heart is enlarged. Left-sided central line tip overlies the level of superior vena cava. There is mild perihilar bronchitic change. There are no focal consolidations. No new lung lesions are not well seen radiographically. No pleural effusions.  Left 6 rib is sclerotic. Scratch IMPRESSION: 1. Cardiomegaly. 2. Bronchitic changes. Electronically Signed   By: Nolon Nations M.D.   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   PLAN: I had a lengthy discussion with the patient and her son today about her current disease stage, prognosis and treatment options. It is unclear if or the pulmonary nodules and mediastinal lymphadenopathy are due to her metastatic disease or part of granulomatous conditions that she had in 2016 but definitely the patient has a stage IV disease with  recent brain metastasis. She is status post craniotomy and resection of the brain tumor. I recommended for the patient to see radiation oncology for consideration of stereotactic radiotherapy to the tumor cavity. I also discussed with the patient several options for treatment of her condition including palliative care and hospice referral versus systemic treatment with either chemotherapy or immunotherapy if she has PDL 1 expression equal or over 50%. The patient is interested in considering treatment. I would ask the pathology department to send her tissue block for PDL 1 expression. I will also arrange for the patient to have repeat CT scan of the chest for restaging of her disease before starting treatment since her last imaging studies were in November 2016. I would see the patient back for follow-up visit in 2 weeks for reevaluation and more detailed discussion of her treatment options based on the PDL 1 expression and also off to her completion of her radiotherapy to the tumor cavity in the brain. She was advised to call immediately if she has any concerning symptoms in the interval.  The patient voices understanding of current disease status and treatment options and is in agreement with the current care plan.  All questions were answered. The patient knows to call the clinic with any problems, questions or concerns. We can certainly see the patient much sooner if necessary.  Thank you so much for allowing me to participate in the care of [place]. I will continue to follow up the patient with you and assist in her care.  I spent 40 minutes counseling the patient face to face. The total time spent in the appointment was 60 minutes.  Disclaimer: This note was dictated with voice recognition software. Similar sounding words can inadvertently be transcribed and may not be corrected upon review.   Margaret Padgett K. July 05, 2015, 3:12 PM

## 2015-07-07 ENCOUNTER — Other Ambulatory Visit (HOSPITAL_COMMUNITY)
Admission: RE | Admit: 2015-07-07 | Discharge: 2015-07-07 | Disposition: A | Discharge status: Home / Self Care | Payer: Medicare Other | Source: Ambulatory Visit | Attending: Internal Medicine | Admitting: Internal Medicine

## 2015-07-07 DIAGNOSIS — C3491 Malignant neoplasm of unspecified part of right bronchus or lung: Secondary | ICD-10-CM | POA: Diagnosis present

## 2015-07-12 ENCOUNTER — Ambulatory Visit (HOSPITAL_COMMUNITY)
Admission: RE | Admit: 2015-07-12 | Discharge: 2015-07-12 | Disposition: A | Discharge status: Home / Self Care | Payer: Medicare Other | Source: Ambulatory Visit | Attending: Internal Medicine | Admitting: Internal Medicine

## 2015-07-12 ENCOUNTER — Encounter (HOSPITAL_COMMUNITY): Payer: Self-pay

## 2015-07-12 DIAGNOSIS — C3491 Malignant neoplasm of unspecified part of right bronchus or lung: Secondary | ICD-10-CM | POA: Insufficient documentation

## 2015-07-12 DIAGNOSIS — E049 Nontoxic goiter, unspecified: Secondary | ICD-10-CM | POA: Insufficient documentation

## 2015-07-12 DIAGNOSIS — E042 Nontoxic multinodular goiter: Secondary | ICD-10-CM | POA: Diagnosis not present

## 2015-07-12 DIAGNOSIS — R911 Solitary pulmonary nodule: Secondary | ICD-10-CM | POA: Insufficient documentation

## 2015-07-12 MED ORDER — IOHEXOL 300 MG/ML  SOLN
75.0000 mL | Freq: Once | INTRAMUSCULAR | Status: AC | PRN
  Administered 2015-07-12: 75 mL via INTRAVENOUS

## 2015-07-12 NOTE — Progress Notes (Addendum)
Location/Histology of Brain Tumor:  Right Temporal  Lobe mass  , Primary lung cancer  Patient presented with symptoms of:  none  Past or anticipated interventions, if any, per neurosurgery:  06/09/2015:Procedure: Right frontotemporal parietal craniotomy for resection of brain tumors using microdissection and [facility] neuro navigation Diagnosis 06/09/2015: Dr. Earle Gell, MD 1. Brain, for tumor resection - METASTATIC HIGH GRADE POORLY DIFFERENTIATED CARCINOMA WITH NEUROENDOCRINE DIFFERENTIATION CONSISTENT WITH LUNG PRIMARY, SEE COMMENT. 2. Brain, for tumor resection - METASTATIC HIGH GRADE POORLY DIFFERENTIATED CARCINOMA WITH NEUROENDOCRINE DIFFERENTIATION CONSISTENT WITH LUNG PRIMARY, SEE COMMENT.   Past or anticipated interventions, if any, per medical oncology: Dr. Julien Nordmann  07/05/15: referral to Radiation , also repeat Ct  Scan of the chest for restaging of disease before starting treatment, f/u in 2 weeks re-evaluation  Dose of Decadron, if applicable: No  Recent neurologic symptoms, if any: NONE  Seizures: no  Headaches: no  Nausea: no  Dizziness/ataxia: no  Difficulty with hand coordination: no  Focal numbness/weakness: no  Visual deficits/changes: no  Confusion/Memory deficits: NO  Painful bone metastases at present, if any: NO SAFETY ISSUES:  Prior radiation? NO  Pacemaker/ICD? NO  Possible current pregnancy?  N/A  Is the patient on methotrexate? NO  Additional Complaints / other details:Widow, 2 sons, Hx Mediastinoscopy 08/02/11, Coronary angiosplasty with Stent x1 , thyroid disease, , former cigarette smoker .5ppd x 10 years quit 06/2005;  Never used smokeless tobacco, no alcohol or illicit drugs Mother  Died from pyelonephritis age 66; father unknown  Allergies:Pantoprazole =-Rash FOOD:sausage,hotdogs,salami=rash; Lipitor=myalgias BP 150/61 mmHg  Pulse 56  Temp(Src) 97.8 F (36.6 C) (Axillary)  Resp 20  Ht '5\' 4"'$  (1.626 m)  Wt 172 lb 11.2 oz (78.336 kg)   BMI 29.63 kg/m2  SpO2 100%  Wt Readings from Last 3 Encounters:  07/14/15 172 lb 11.2 oz (78.336 kg)  07/05/15 173 lb 9.6 oz (78.744 kg)  06/09/15 164 lb 14.5 oz (74.801 kg)

## 2015-07-14 ENCOUNTER — Ambulatory Visit
Admission: RE | Admit: 2015-07-14 | Discharge: 2015-07-14 | Disposition: A | Discharge status: Home / Self Care | Payer: Medicare Other | Source: Ambulatory Visit | Attending: Radiation Oncology | Admitting: Radiation Oncology

## 2015-07-14 ENCOUNTER — Encounter: Payer: Self-pay | Admitting: Radiation Oncology

## 2015-07-14 VITALS — BP 150/61 | HR 56 | Temp 97.8°F | Resp 20 | Ht 64.0 in | Wt 172.7 lb

## 2015-07-14 DIAGNOSIS — C7931 Secondary malignant neoplasm of brain: Secondary | ICD-10-CM | POA: Diagnosis present

## 2015-07-14 DIAGNOSIS — Z7982 Long term (current) use of aspirin: Secondary | ICD-10-CM | POA: Diagnosis not present

## 2015-07-14 DIAGNOSIS — E785 Hyperlipidemia, unspecified: Secondary | ICD-10-CM | POA: Insufficient documentation

## 2015-07-14 DIAGNOSIS — C3491 Malignant neoplasm of unspecified part of right bronchus or lung: Secondary | ICD-10-CM

## 2015-07-14 DIAGNOSIS — C3490 Malignant neoplasm of unspecified part of unspecified bronchus or lung: Secondary | ICD-10-CM | POA: Diagnosis not present

## 2015-07-14 DIAGNOSIS — Z79899 Other long term (current) drug therapy: Secondary | ICD-10-CM | POA: Diagnosis not present

## 2015-07-14 DIAGNOSIS — E079 Disorder of thyroid, unspecified: Secondary | ICD-10-CM | POA: Insufficient documentation

## 2015-07-14 DIAGNOSIS — C7949 Secondary malignant neoplasm of other parts of nervous system: Secondary | ICD-10-CM

## 2015-07-14 DIAGNOSIS — I2510 Atherosclerotic heart disease of native coronary artery without angina pectoris: Secondary | ICD-10-CM | POA: Diagnosis not present

## 2015-07-14 DIAGNOSIS — Z888 Allergy status to other drugs, medicaments and biological substances status: Secondary | ICD-10-CM | POA: Diagnosis not present

## 2015-07-14 DIAGNOSIS — Z87891 Personal history of nicotine dependence: Secondary | ICD-10-CM | POA: Insufficient documentation

## 2015-07-14 NOTE — Progress Notes (Signed)
Please see the Nurse Progress Note in the MD Initial Consult Encounter for this patient. 

## 2015-07-14 NOTE — Progress Notes (Signed)
Radiation Oncology         (336) [phone] ________________________________  Name: Margaret Hale MRN: [id number]  Date: 07/14/2015  DOB: 1935-05-09  OZ:HYQMV,HQIONGE S, MD  Curt Bears, MD     REFERRING PHYSICIAN: Curt Bears, MD   DIAGNOSIS: Stage IV (T1a, N3, M1 B) non-small cell lung cancer, high-grade poorly differentiated carcinoma with neuroendocrine differentiation of lung primary  HISTORY OF PRESENT ILLNESS:Margaret Hale is a 81 y.o. female who is seen for an initial consultation visit. 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 She was initially diagnosed with pulmonary nodules in January 2013, and underwent an extensive workup revealing revealing granulomatous disease on biopsy. She has since been followed annually with CT scans of the chest, and in October 2016 a new pulmonary nodule is noted in the right upper lobe. This was measured at 1.5 cm and a 6 mm central solid component was noted within it. There were other nodules present that were unchanged from her previous scans. She underwent a PET scan on 04/07/2015 revealing adenopathy that was hypermetabolic in the paratracheal, bilateral hilar, and AP window lymph nodes in the right hilar node was difficult to measure in size but had a mass seminoma SUV of 16.3. Previous left upper lobe nodule which had been hypermetabolic in the past had resolved, and a right middle lobe nodule measuring 0.9 x 1.2 cm was noted with an SUV of 6.4. A 1.6 x 1.470 to right upper lobe nodule was solid and sub-solid components had a maximum uptake of 4.2. Outside of the chest, no evidence of metastatic disease is noted in the abdomen or pelvis and no focal metastases were identified in the skeletal system. In the brain however  there was a 4.2 x 3.1 cm lesion with hypometabolism, she subsequently underwent an MRI of the brain on 04/29/2015 revealing a 5.6 x 4.1 x 3.9 cm largely cystic with peripheral enhancement in the right temporal lobe. There were 2 areas of enhancing soft tissue nodules measuring 1.9 x 1 cm and 8 mm adjacent to the lesion. Mild surrounding edema with left midline shift of 2-3 mm was present. She subsequently underwent right frontal temporal parietal craniotomy with a tumor revealing metastatic high-grade poorly differentiated carcinoma of endocrine histology consistent with lung primary. Her PDL 1 testing is still pending with Dr. Julien Nordmann, and she sees him back in approximately 2 weeks time. She comes today for consideration of SRS under the care of Dr. Lisbeth Renshaw to the cavity where previous craniotomy have been performed by Dr. Arnoldo Morale.   PREVIOUS RADIATION THERAPY: No   PAST MEDICAL HISTORY:  has a past medical history of Hyperlipidemia; CAD (coronary artery disease) (2007); Thyroid disease; Baker's cyst; Vitamin D deficiency; Disc disorder of lumbar region; No pertinent past medical history; Dizzy; Hypertension; Cancer ([place]); and Anxiety.     PAST SURGICAL HISTORY: Past Surgical History  Procedure Laterality Date  . Carpal tunnel release  06/26/08        04/20/09    RIGHT HAND          LEFT HAND  . Finger contracture release  8/12    Dr. Amedeo Plenty    right hand trigger finger release  . Eye surgery  01/07/09    LEFT CATARACT  . Eye surgery  01/21/09    RIGHT CATARACT  .  Coronary angioplasty      dr h Tamala Julian [phone] with one stent  . Mediastinoscopy  08/02/2011    Procedure: MEDIASTINOSCOPY;  Surgeon: Nicanor Alcon, MD;  Location: [facility] OR;  Service: Thoracic;  Laterality: N/A;  . Craniotomy Right 06/09/2015    Procedure: CRANIOTOMY TUMOR EXCISION WITH APPLICATION OF CRANIAL NAVIGATION;  Surgeon: Newman Pies, MD;  Location: [facility];  Service: Neurosurgery;  Laterality: Right;  Right temporal  craniotomy for resection of tumor     FAMILY HISTORY: family history is negative for Anesthesia problems, Hypotension, Malignant hyperthermia, and Pseudochol deficiency.   SOCIAL HISTORY:  reports that she quit smoking about 10 years ago. Her smoking use included Cigarettes. She has a 5 pack-year smoking history. She has never used smokeless tobacco. She reports that she does not drink alcohol or use illicit drugs.   ALLERGIES: Food; Lipitor; and Pantoprazole sodium   MEDICATIONS:  Current Outpatient Prescriptions  Medication Sig Dispense Refill  . amLODipine (NORVASC) 10 MG tablet Take 10 mg by mouth every evening.     Marland Kitchen aspirin 81 MG tablet Take 81 mg by mouth daily.    . hydrochlorothiazide (HYDRODIURIL) 25 MG tablet Take 25 mg by mouth daily.    . metoprolol succinate (TOPROL-XL) 100 MG 24 hr tablet Take 100 mg by mouth daily. Take with or immediately following a meal.    . Omega-3 Fatty Acids (FISH OIL PO) Take 4,000 Units by mouth 2 (two) times daily.    Marland Kitchen rOPINIRole (REQUIP) 1 MG tablet Take 1 mg by mouth at bedtime.    . valsartan (DIOVAN) 320 MG tablet Take 320 mg by mouth daily.    Marland Kitchen docusate sodium (COLACE) 100 MG capsule Take 1 capsule (100 mg total) by mouth 2 (two) times daily. (Patient not taking: Reported on 07/05/2015) 60 capsule 0  . HYDROcodone-acetaminophen (NORCO/VICODIN) 5-325 MG tablet Take 1 tablet by mouth every 4 (four) hours as needed for moderate pain. (Patient not taking: Reported on 07/05/2015) 50 tablet 0   No current facility-administered medications for this encounter.     REVIEW OF SYSTEMS: On review of systems, the patient reports that she is doing pretty well overall. She is not experiencing any difficulty with headaches, blurred vision, seizures, nausea, dizziness/ataxia, difficulty with hand coordination, focal numbness/weakness, visual deficits/changes, and confusion/memory deficits. She states that she does have some hearing loss in her ears right  greater than left. She denies any tinnitus. She is not experiencing any abdominal pain, or vomiting. She denies chest pain or shortness of breath. She states a rare cough that increased in the weeks following her surgical procedure but denies any current cough at this time. She did not have any hemoptysis in the past. She also reports having experienced neck pain over the last two years that was present mostly at night. She reports that the neck pain was diminished since surgical procedure. She has gained approximately 15 pounds since her diagnosis. A complete review of systems is obtained and is otherwise negative.    PHYSICAL EXAM:  height is '5\' 4"'$  (1.626 m) and weight is 172 lb 11.2 oz (78.336 kg). Her axillary temperature is 97.8 F (36.6 C). Her blood pressure is 150/61 and her pulse is 56. Her respiration is 20 and oxygen saturation is 100%.    Pain scale 0/10.  In general, this is a well-appearing [place] female in no acute distress. She is alert and oriented x4 and appropriate throughout the examination. Cardiovascular exam reveals a regular  rate and rhythm; no clicks, rubs or murmurs auscultated. Chest is clear to auscultation bilaterally. No palpable adenopathy is noted of the supraclavicular, cervical or axillary chains. HEENT reveals normocephalic atraumatic, EOMs are intact. Her craniotomy incision is healed very well, and no evidence of cellulitic streaking bleeding or drainage is noted. PERRLA, oropharynx is intact without any focal abnormalities. Bilateral ears are assessed in the external auditory canals bilaterally are negative for cerumen or lesion. Her TMs are intact. No fluid is noted posterior to these.   ECOG = 0  0 - Asymptomatic (Fully active, able to carry on all predisease activities without restriction)  1 - Symptomatic but completely ambulatory (Restricted in physically strenuous activity but ambulatory and able to carry out work of a light or sedentary nature. For  example, light housework, office work)  2 - Symptomatic, <50% in bed during the day (Ambulatory and capable of all self care but unable to carry out any work activities. Up and about more than 50% of waking hours)  3 - Symptomatic, >50% in bed, but not bedbound (Capable of only limited self-care, confined to bed or chair 50% or more of waking hours)  4 - Bedbound (Completely disabled. Cannot carry on any self-care. Totally confined to bed or chair)  5 - Death   Eustace Pen MM, Creech RH, Tormey DC, et al. [phone]). "Toxicity and response criteria of the [facility]". [place] Oncol. 5 (6): 649-55     LABORATORY DATA:  Lab Results  Component Value Date   WBC 5.7 06/07/2015   HGB 14.6 06/07/2015   HCT 43.8 06/07/2015   MCV 90.9 06/07/2015   PLT 186 06/07/2015   Lab Results  Component Value Date   NA 140 06/07/2015   K 4.2 06/07/2015   CL 104 06/07/2015   CO2 27 06/07/2015   Lab Results  Component Value Date   ALT 20 10/01/2014   AST 18 10/01/2014   ALKPHOS 90 10/01/2014   BILITOT 0.6 10/01/2014      RADIOGRAPHY: Ct Chest W Contrast  07/12/2015  CLINICAL DATA:  Stage IV non-small cell lung cancer. EXAM: CT CHEST WITH CONTRAST TECHNIQUE: Multidetector CT imaging of the chest was performed during intravenous contrast administration. CONTRAST:  37m OMNIPAQUE IOHEXOL 300 MG/ML  SOLN COMPARISON:  PET-CT from 04/07/2015.  Chest CT from 03/24/2015. FINDINGS: Mediastinum / Lymph Nodes: Stable right thyroid nodules. There is no axillary lymphadenopathy. 7 mm short axis right paratracheal lymph node seen on the PET-CT measures 6 mm short axis today. 11 mm short axis precarinal lymph node on today's study was 11 mm previously. No left hilar lymphadenopathy. 10 mm short axis right hilar lymph node was not clearly visualized on either of the 2 prior exams but was seen to be hypermetabolic on the previous PET-CT. The heart size is normal. No pericardial effusion. Coronary  artery calcification is noted. The esophagus has normal imaging features. Lungs / Pleura: Biapical pleural-parenchymal scarring again noted. Index 9 x 12 mm right middle lobe nodule is 9 x 11 mm on image 31 series 5 today. The 16 x 14 mm posterior right upper lobe pulmonary nodule measured on the previous PET-CT has decreased in the interval now measuring 14 x 10 mm. 9 mm left upper lobe ground-glass nodule on image 29 is unchanged. Upper Abdomen: No evidence for adrenal nodule or mass. Tiny probable cyst upper pole left kidney. MSK / Soft Tissues: Bone windows reveal no worrisome lytic or sclerotic osseous lesions. IMPRESSION: 1.  No substantial change in mediastinal lymphadenopathy with 10 mm short axis right hilar lymph node visible on today's exam (hypermetabolic right hilar disease was described on the previous PET-CT). 2. Index nodules in the right lung measure stable to slightly smaller in the interval. The 9 mm left lung ground-glass nodule is unchanged. 3. Stable appearance of nodular enlargement of the right thyroid gland. 4. Atherosclerosis. Electronically Signed   By: Misty Stanley M.D.   On: 07/12/2015 14:22      IMPRESSION: Stage IV (T1a, N3, M1 B) non-small cell lung cancer, high-grade poorly differentiated carcinoma with neuroendocrine differentiation of lung primary.  PLAN: We will proceed with palliative stereotactic radiosurgery to the brain in 1 fraction. Radiation planning scan is scheduled for today, 07/14/2015, at 11:00 am. Following completion of radiation treatment, we will proceed with active surveillance of the brain via MRI scans every 3 months. Dr. Lisbeth Renshaw discusses the risks benefits and recovery time and the patient is interested in moving forward.     The above documentation reflects my direct findings during this shared patient visit. Please see the separate note by Dr. Lisbeth Renshaw on this date for the remainder of the patient's plan of care.  Carola Rhine, PAC    This  document serves as a record of services personally performed by Shona Simpson, PA and Kyung Rudd, MD. It was created on their behalf by Jenell Milliner, a trained medical scribe. The creation of this record is based on the scribe's personal observations and the provider's statements to them. This document has been checked and approved by the attending provider.

## 2015-07-20 ENCOUNTER — Telehealth: Payer: Self-pay | Admitting: Radiation Therapy

## 2015-07-20 ENCOUNTER — Other Ambulatory Visit: Payer: Self-pay | Admitting: Internal Medicine

## 2015-07-20 NOTE — Telephone Encounter (Signed)
I spoke with Dr. Saralyn Pilar in pathology this morning about Ms. Sisneros's recent biopsy result. When we discussed this case in Conference last Monday, Dr. Lyndon Code presented this as a small cell lung primary, but the path report only read poorly differentiated carcinoma with neuroendocrine features.         Dr. Saralyn Pilar is the physician that read the initial path and agrees that this is small cell lung cancer. He said that he will make an addendum to the report to clarify that.   Dr. Julien Nordmann, Dr. Lisbeth Renshaw, Hinton Dyer and Bryson Ha have been informed via Epic in-basket.    Mont Dutton R.T. (R) (T) Radiation Special Procedures [place] [phone] Office [phone] Pager [phone] Fax [email or web address]

## 2015-07-21 ENCOUNTER — Ambulatory Visit: Payer: Medicare Other | Admitting: Radiation Oncology

## 2015-07-21 NOTE — Progress Notes (Signed)
Addendum: The patient's pathology was re-reviewed and her biopsy is felt to be consistent with small cell carcinoma of the lung. She is currently seeking a second opinion for her treatment.  Shona Simpson, [facility]

## 2015-07-21 NOTE — Addendum Note (Signed)
Encounter addended by: Hayden Pedro, PA-C on: 07/21/2015  7:37 AM<BR>     Documentation filed: Notes Section

## 2015-07-22 ENCOUNTER — Ambulatory Visit: Payer: Medicare Other

## 2015-07-22 ENCOUNTER — Telehealth: Payer: Self-pay | Admitting: Radiation Oncology

## 2015-07-22 NOTE — Telephone Encounter (Signed)
Tried to [facility] for pt to call me back at her convenience, but VM is not set up. I called her son, and today she's at [facility] having a PET scan. They will let us know how she wants to proceed but she is currently contemplating no radiation treatments.

## 2015-07-23 ENCOUNTER — Ambulatory Visit: Payer: Medicare Other

## 2015-07-23 ENCOUNTER — Telehealth: Payer: Self-pay | Admitting: Radiation Oncology

## 2015-07-26 ENCOUNTER — Ambulatory Visit
Admission: RE | Admit: 2015-07-26 | Discharge: 2015-07-26 | Disposition: A | Discharge status: Home / Self Care | Payer: Medicare Other | Source: Ambulatory Visit | Attending: Radiation Oncology | Admitting: Radiation Oncology

## 2015-07-26 ENCOUNTER — Ambulatory Visit: Payer: Medicare Other | Admitting: Radiation Oncology

## 2015-07-26 ENCOUNTER — Encounter: Payer: Self-pay | Admitting: Radiation Oncology

## 2015-07-26 ENCOUNTER — Ambulatory Visit: Payer: Medicare Other

## 2015-07-26 VITALS — BP 186/87 | HR 55 | Temp 97.8°F | Resp 20 | Ht 64.0 in | Wt 174.0 lb

## 2015-07-26 DIAGNOSIS — C7931 Secondary malignant neoplasm of brain: Secondary | ICD-10-CM

## 2015-07-26 DIAGNOSIS — C3411 Malignant neoplasm of upper lobe, right bronchus or lung: Secondary | ICD-10-CM

## 2015-07-26 DIAGNOSIS — C7949 Secondary malignant neoplasm of other parts of nervous system: Principal | ICD-10-CM

## 2015-07-26 MED ORDER — MEMANTINE HCL 10 MG PO TABS: 20.0000 mg | ORAL_TABLET | Freq: Every day | ORAL | Status: DC

## 2015-07-26 NOTE — Progress Notes (Signed)
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 She had an initial consultation in radiation oncology on 07/14/15. Initially, her cancer was felt to be non-small cell, however pathology addended the diagnosis to a small cell carcinoma. Initially, SRS was discussed, however after this addendum, recommendations were made for whole brain radiation. In the meantime, she sought an assessment at [facility] to determine if she was a candidate for a clinical trial. Due to the changes in pathology diagnosis, she does not qualify for any upfront treatment on clinical trial. Since consultation, her PD-L1 Tumor Proportion Score came back as 5% (low expression). CT simulation was conducted on 07/14/2015. She did have PET scan at [facility], but the results were not available at the time of dictation. She is upset about her diagnosis of cancer and is worried about cognitive changes from radiation treatment. She comes with her son today to discuss these concerns and recommendations of care moving forward.  ALLERGIES:  is allergic to food; lipitor; and pantoprazole sodium.  Meds: Current Outpatient Prescriptions  Medication Sig Dispense Refill  . amLODipine (NORVASC) 10 MG tablet Take 10 mg by mouth every evening.     Marland Kitchen aspirin 81 MG tablet Take 81 mg by mouth daily.    . hydrochlorothiazide (HYDRODIURIL) 25 MG tablet Take 25 mg by mouth daily.    . metoprolol succinate (TOPROL-XL) 100 MG 24 hr tablet Take 100 mg by mouth daily. Take with or immediately following a meal.    . Omega-3 Fatty Acids (FISH OIL PO) Take 4,000 Units by mouth 2 (two) times daily.    Marland Kitchen  rOPINIRole (REQUIP) 1 MG tablet Take 1 mg by mouth at bedtime.    . valsartan (DIOVAN) 320 MG tablet Take 320 mg by mouth daily.    Marland Kitchen docusate sodium (COLACE) 100 MG capsule Take 1 capsule (100 mg total) by mouth 2 (two) times daily. (Patient not taking: Reported on 07/05/2015) 60 capsule 0  . HYDROcodone-acetaminophen (NORCO/VICODIN) 5-325 MG tablet Take 1 tablet by mouth every 4 (four) hours as needed for moderate pain. (Patient not taking: Reported on 07/05/2015) 50 tablet 0   No current facility-administered medications for this encounter.    Physical Findings:  height is '5\' 4"'$  (1.626 m) and weight is 174 lb (78.926 kg). Her oral temperature is 97.8 F (36.6 C). Her blood pressure is 186/87 and her pulse is 55. Her respiration is 20 and oxygen saturation is 98%. .   Pain scale 0/10 Well appearing [place] female in no acute distress. She is alert and oriented x4 and appropriate during the exam. Cardiopulmonary assessment does not reveal acute distress, and she exhibits normal effort.  Lab Findings: Lab Results  Component Value Date   WBC 5.7 06/07/2015   HGB 14.6 06/07/2015   HCT 43.8 06/07/2015   MCV 90.9 06/07/2015   PLT 186 06/07/2015    Radiographic Findings: Ct Chest W Contrast  07/12/2015  CLINICAL DATA:  Stage IV non-small cell lung cancer. EXAM: CT CHEST WITH CONTRAST TECHNIQUE: Multidetector CT imaging of the chest was performed during intravenous contrast administration. CONTRAST:  61m OMNIPAQUE IOHEXOL 300 MG/ML  SOLN  COMPARISON:  PET-CT from 04/07/2015.  Chest CT from 03/24/2015. FINDINGS: Mediastinum / Lymph Nodes: Stable right thyroid nodules. There is no axillary lymphadenopathy. 7 mm short axis right paratracheal lymph node seen on the PET-CT measures 6 mm short axis today. 11 mm short axis precarinal lymph node on today's study was 11 mm previously. No left hilar lymphadenopathy. 10 mm short axis right hilar lymph node was not clearly visualized on either of the 2  prior exams but was seen to be hypermetabolic on the previous PET-CT. The heart size is normal. No pericardial effusion. Coronary artery calcification is noted. The esophagus has normal imaging features. Lungs / Pleura: Biapical pleural-parenchymal scarring again noted. Index 9 x 12 mm right middle lobe nodule is 9 x 11 mm on image 31 series 5 today. The 16 x 14 mm posterior right upper lobe pulmonary nodule measured on the previous PET-CT has decreased in the interval now measuring 14 x 10 mm. 9 mm left upper lobe ground-glass nodule on image 29 is unchanged. Upper Abdomen: No evidence for adrenal nodule or mass. Tiny probable cyst upper pole left kidney. MSK / Soft Tissues: Bone windows reveal no worrisome lytic or sclerotic osseous lesions. IMPRESSION: 1. No substantial change in mediastinal lymphadenopathy with 10 mm short axis right hilar lymph node visible on today's exam (hypermetabolic right hilar disease was described on the previous PET-CT). 2. Index nodules in the right lung measure stable to slightly smaller in the interval. The 9 mm left lung ground-glass nodule is unchanged. 3. Stable appearance of nodular enlargement of the right thyroid gland. 4. Atherosclerosis. Electronically Signed   By: Misty Stanley M.D.   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  Plan: Dr. Lisbeth Renshaw again reviews her addended pathology and explains the rationale for whole brain radiation over [facility]. He recommends proceeding with a two week course of whole brain radiation. He discusses the risks, benefits, and treatment strategy. She does have concerns about the effects on her cognition, and after significant discussion, Dr. Lisbeth Renshaw offers the patient the opportunity to try Namenda to counteract these potential changes in congnition. He discusses that this is an off label use of the medication and the clinical trial looking at this did show benefit to the patients  who were using this, though it is not considered standard of care. He outlines the side effect profile, and the patient is interested in trying this if she can afford the medication, should her insurance not cover the cost. Regarding her treatment, as she has already had simulation, we will proceed with her port films and treatment on Wednesday of this week. She states agreeement and understanding of this.  In a visit lasting 25 minutes, greater than 50% of the time was spent discussing the patient's concerns about losing her ability to retain cognition and her personality during and following radiotherapy.  The above documentation reflects my direct findings during this shared patient visit. Please see the separate note by Dr. Lisbeth Renshaw on this date for the remainder of the patient's plan of care.  Carola Rhine, PAC  This document serves as a record of services personally performed by Shona Simpson, PAC and Kyung Rudd, MD. It was created on their behalf by Darcus Austin, a trained medical scribe. The creation of this record is based on the scribe's personal observations and the provider's statements to them. This document has been checked and approved by  the attending provider.

## 2015-07-26 NOTE — Progress Notes (Signed)
Please see the Nurse Progress Note in the MD Initial Consult Encounter for this patient. 

## 2015-07-26 NOTE — Telephone Encounter (Signed)
I spoke with the patient and the patient's son stated that they had been at [facility] but are going to be coming back to talk about radiation treatment. They await PET scan results Monday, and will come at 3:30 to discuss further xrt treatments with Dr. Lisbeth Renshaw.

## 2015-07-26 NOTE — Progress Notes (Signed)
Follow up New Consult  Patient has small cell carcinoma of the lung she had went for  Second  Opinion  Dr. Georgeanna Lea, MD @  [place] 07/15/15  Pet scan  Done last Thursday  Pending  No head aches, no nausea,  Or vision changes, no pain, here to discuss whole brain radiation, possibly BP 186/87 mmHg  Pulse 55  Temp(Src) 97.8 F (36.6 C) (Oral)  Resp 20  Ht '5\' 4"'$  (1.626 m)  Wt 174 lb (78.926 kg)  BMI 29.85 kg/m2  SpO2 98%  . Wt Readings from Last 3 Encounters:  07/26/15 174 lb (78.926 kg)  07/14/15 172 lb 11.2 oz (78.336 kg)  07/05/15 173 lb 9.6 oz (78.744 kg)

## 2015-07-27 ENCOUNTER — Ambulatory Visit: Payer: Medicare Other

## 2015-07-27 NOTE — Patient Instructions (Signed)
Contact our office if you have any questions following today's appointment: [phone].  

## 2015-07-28 ENCOUNTER — Telehealth: Payer: Self-pay | Admitting: *Deleted

## 2015-07-28 ENCOUNTER — Other Ambulatory Visit: Payer: Self-pay | Admitting: *Deleted

## 2015-07-28 ENCOUNTER — Telehealth: Payer: Self-pay | Admitting: Radiation Oncology

## 2015-07-28 ENCOUNTER — Ambulatory Visit: Payer: Medicare Other

## 2015-07-28 ENCOUNTER — Telehealth: Payer: Self-pay | Admitting: Oncology

## 2015-07-28 ENCOUNTER — Telehealth: Payer: Self-pay | Admitting: Medical Oncology

## 2015-07-28 ENCOUNTER — Other Ambulatory Visit: Payer: Self-pay | Admitting: Cardiothoracic Surgery

## 2015-07-28 ENCOUNTER — Ambulatory Visit: Admission: RE | Admit: 2015-07-28 | Payer: Medicare Other | Source: Ambulatory Visit

## 2015-07-28 NOTE — Telephone Encounter (Signed)
I spoke with the patient and her son and at this time they are wanting to hold off on any further radiation therapy. She will see Dr. Benay Spice for follow up and will discuss this further with him as well. Dr. Lisbeth Renshaw is notified.

## 2015-07-28 NOTE — Telephone Encounter (Signed)
Patient had called and cancelled today's radiation treatment, called patient and asked if she wasn't feeling well?"No I haven't made up my mind as to have the radiation treatment, my son cannot keep bringing me, I live alone he would lose his job, my friends all work", I asked if scat transportation  Would work for her, she said no, but wants Bryson Ha to call her today to discuss  This and said that Bryson Ha had direct line with [facility] helath system", she apologized but is adament of cancelling today  And will wait to speak with our PA-C Shona Simpson, thanked me for calling and listening to her 8:46 AM

## 2015-07-28 NOTE — Telephone Encounter (Signed)
Left message to inform her of new appt with [facility] per patient request and 3/1 pof

## 2015-07-28 NOTE — Telephone Encounter (Signed)
Call from [place], [place] in [place]. Pt's path has been amended, looks more like small cell. Asks if pt should be seen sooner than 3/21? Reviewed with Dr. Benay Spice: Will work pt in week of 3/6.

## 2015-07-28 NOTE — Telephone Encounter (Signed)
Patient called and stated that she would like to transfer her care to [facility]. Patient reason was due to the recommendation of her surgeon.

## 2015-07-28 NOTE — Telephone Encounter (Signed)
OK with me. Spoke to Dr. Benay Spice.

## 2015-07-28 NOTE — Telephone Encounter (Signed)
Note to [place]. I left message for pt to call back to clarify  her request

## 2015-07-28 NOTE — Telephone Encounter (Signed)
Somehow patient was put on my schedule in a 49mn. Slot 3/21 I was not asked about this Please cancel this appt. She will need new patient slot week of 3/21

## 2015-07-29 ENCOUNTER — Other Ambulatory Visit: Payer: Self-pay | Admitting: *Deleted

## 2015-07-29 ENCOUNTER — Ambulatory Visit: Payer: Medicare Other | Admitting: Radiation Oncology

## 2015-07-29 ENCOUNTER — Ambulatory Visit: Payer: Medicare Other

## 2015-07-30 ENCOUNTER — Ambulatory Visit: Payer: Medicare Other

## 2015-07-30 NOTE — Addendum Note (Signed)
Encounter addended by: Kyung Rudd, MD on: 07/30/2015 11:10 AM<BR>     Documentation filed: Follow-up Section

## 2015-08-02 ENCOUNTER — Other Ambulatory Visit: Payer: Medicare Other

## 2015-08-02 ENCOUNTER — Ambulatory Visit: Payer: Medicare Other | Admitting: Radiation Oncology

## 2015-08-02 ENCOUNTER — Ambulatory Visit: Payer: Medicare Other | Admitting: Oncology

## 2015-08-02 ENCOUNTER — Ambulatory Visit: Payer: Medicare Other

## 2015-08-03 ENCOUNTER — Telehealth: Payer: Self-pay | Admitting: Oncology

## 2015-08-03 ENCOUNTER — Telehealth: Payer: Self-pay | Admitting: *Deleted

## 2015-08-03 ENCOUNTER — Ambulatory Visit (HOSPITAL_BASED_OUTPATIENT_CLINIC_OR_DEPARTMENT_OTHER): Payer: Medicare Other | Admitting: Oncology

## 2015-08-03 ENCOUNTER — Ambulatory Visit: Payer: Medicare Other

## 2015-08-03 ENCOUNTER — Other Ambulatory Visit: Payer: Medicare Other

## 2015-08-03 VITALS — BP 176/66 | HR 52 | Temp 97.9°F | Resp 17 | Ht 64.0 in | Wt 173.8 lb

## 2015-08-03 DIAGNOSIS — C7A1 Malignant poorly differentiated neuroendocrine tumors: Secondary | ICD-10-CM | POA: Diagnosis not present

## 2015-08-03 DIAGNOSIS — C3491 Malignant neoplasm of unspecified part of right bronchus or lung: Secondary | ICD-10-CM | POA: Insufficient documentation

## 2015-08-03 DIAGNOSIS — C7B8 Other secondary neuroendocrine tumors: Secondary | ICD-10-CM

## 2015-08-03 DIAGNOSIS — C3411 Malignant neoplasm of upper lobe, right bronchus or lung: Secondary | ICD-10-CM

## 2015-08-03 DIAGNOSIS — I10 Essential (primary) hypertension: Secondary | ICD-10-CM

## 2015-08-03 DIAGNOSIS — R599 Enlarged lymph nodes, unspecified: Secondary | ICD-10-CM | POA: Diagnosis not present

## 2015-08-03 DIAGNOSIS — R911 Solitary pulmonary nodule: Secondary | ICD-10-CM

## 2015-08-03 NOTE — Telephone Encounter (Signed)
Called pt's son with plan to begin chemo 2/20. He reports her appt with holistic practitioner ran longer than expected today. Informed him chemo class will be rescheduled, schedulers will contact them with appts.

## 2015-08-03 NOTE — Progress Notes (Signed)
[facility] New Patient Consult   Referring MD: Cynthia White, Md [address] [place], [place] [address]   Margaret Hale 80 y.o.  05/01/1935    Reason for Referral:  Small cell lung cancer   HPI:  She underwent an evaluation for lung nodules and B-cell adenopathy 2013. A PET scan 07/07/2011 showed lingular and right middle lobe hypermetabolic nodules with hypermetabolic hilar or mediastinal adenopathy. There was a hypermetabolic left rib lesion with underlying fracture. She underwent a bronchoscopy and endobronchial ultrasound directed biopsies with negative cytology. She underwent a mediastinoscopy 08/03/2011 with negative cytology. There was evidence of granulomatous disease.   she was followed with serial CT scans. On 03/24/2015 a CT of the chest confirmed enlargement of a right upper lobe nodule. Other nodules were unchanged.    A PET scan 04/07/2015 was similar to a PET scan from 2013 other than a new right upper  Lobe hypermetabolic nodule. There is resolution of a right middle lobe hypermetabolic nodule. Some nodules have been present since 2007 and there is suspicion for granulomatous disease. A new hypodense and hypometabolic lesion was noted in the right temporal lobe.    an MRI of the brain 04/29/2015 revealed a 5.6 cm cystic mass with neural nodules in the right temporal lobe with mild surrounding edema.  She was referred to Dr. Jenkins and was taken the operating room on 06/09/2015 for resection of the right temporal tumor. 2 nodular lesions were removed from the cyst cavity.   the pathology ([id number] ) confirmed a metastatic high-grade poorly temperature to carcinoma with neuroendocrine differentiation consistent with a lung primary. The morphology and immunophenotype are consistent with small cell carcinoma. The PD-L1 score returned at 5%.     she saw Dr. Mohammad and Dr. Moody. Dr. Moody recommends whole brain radiation. She has decided  against brain radiation. She is concerned about the potential toxicities.    she saw Dr. Ready at [facility]. A PET scan and due to 23 2017 was compared to the 04/07/2015 PET scan from [place]. The PET scan revealed a metabolically active right upper lobe nodule, multiple hypermetabolic mediastinal and hilar nodes. Decreased metabolic activity of a right middle lobe nodule , now with an SUV of 3.9. Decreased activity associated with a left anterior second rib lesion.   review of the brain resection pathology at [facility] is consistent with metastatic small cell carcinoma.   [name] has a follow-up on the scheduled with Dr. Ready on 08/06/2015.    she feels well at present.   Past Medical History  Diagnosis Date  . Hyperlipidemia   . CAD (coronary artery disease) 2007    PCI of RCA, bare metal stent.....Dr. Turner  . Thyroid disease     nodules,stable on u/s 2/12  . Baker's cyst     L KNEE  . Vitamin D deficiency   . Disc disorder of lumbar region     2 BULDGING DISCS DX 1990 BY DR. DALLDORF  . No pertinent past medical history     lung mass  occas cough   . Dizzy   . Hypertension     Takes Novasc ,Clonidine, & Losartan  .  small cell lung cancer, resection of right temporal mass  06/09/2015      . Anxiety     Past Surgical History  Procedure Laterality Date  . Carpal tunnel release  06/26/08        04/20/09    RIGHT HAND            LEFT HAND  . Finger contracture release  8/12    Dr. Gramig    right hand trigger finger release  . Eye surgery  01/07/09    LEFT CATARACT  . Eye surgery  01/21/09    RIGHT CATARACT  . Coronary angioplasty      dr h smith [phone] with one stent  . Mediastinoscopy  08/02/2011    Procedure: MEDIASTINOSCOPY;  Surgeon: Donald P Burney, MD;  Location: [facility] OR;  Service: Thoracic;  Laterality: N/A;  . Craniotomy Right 06/09/2015    Procedure: CRANIOTOMY TUMOR EXCISION WITH APPLICATION OF CRANIAL NAVIGATION;  Surgeon: Jeffrey Jenkins, MD;  Location: [facility] NEURO  ORS;  Service: Neurosurgery;  Laterality: Right;  Right temporal craniotomy for resection of tumor    Medications: Reviewed  Allergies:  Allergies  Allergen Reactions  . Food     Sausage, hot dogs, salami (rash)  . Lipitor [Atorvastatin Calcium] Other (See Comments)    MYALGIAS  . Pantoprazole Sodium Rash    Family history:  No family history of cancer  Social History:    she is is alone in [place]. 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 She quit smoking cigarettes in 2007. No alcohol use. No risk factor for HIV or hepatitis.    ROS:   Positives include: occasional cough  A complete ROS was otherwise negative.  Physical Exam:  Blood pressure 176/66, pulse 52, temperature 97.9 F (36.6 C), temperature source Oral, resp. rate 17, height 5' 4" (1.626 m), weight 173 lb 12.8 oz (78.835 kg), SpO2 96 %.  HEENT:  Upper and lower denture plate Lungs:  Scattered end inspiratory rhonchi, no respiratory distress Cardiac:  Regular rate and rhythm Abdomen:  No hepatosplenomegaly, no mass, nontender  Vascular:   No leg edema Lymph nodes:  No cervical, supraclavicular, axillary, or inguinal nodes Neurologic:  Alert and oriented, the motor exam appears intact in the upper and lower extremities Skin:  Healed craniotomy incision at the right parietal scalp Musculoskeletal:  No spine tenderness   LAB:  CBC  Lab Results  Component Value Date   WBC 5.7 06/07/2015   HGB 14.6 06/07/2015   HCT 43.8 06/07/2015   MCV 90.9 06/07/2015   PLT 186 06/07/2015     CMP      Component Value Date/Time   NA 140 06/07/2015 0948   K 4.2 06/07/2015 0948   CL 104 06/07/2015 0948   CO2 27 06/07/2015 0948   GLUCOSE 115* 06/07/2015 0948   BUN 14 06/07/2015 0948   CREATININE 0.66 06/07/2015 0948   CREATININE 0.66 07/13/2011 1714   CALCIUM 9.2 06/07/2015 0948   PROT 7.1 10/01/2014 1500   ALBUMIN 4.0 10/01/2014 1500   AST 18 10/01/2014 1500   ALT 20 10/01/2014 1500    ALKPHOS 90 10/01/2014 1500   BILITOT 0.6 10/01/2014 1500   GFRNONAA >60 06/07/2015 0948   GFRAA >60 06/07/2015 0948      Imaging:   04/07/2015 PET and 03/24/2015 CT chest images reviewed with the patient and her son  Assessment/Plan:   1.  extensive stage small cell lung cancer   resection of right temporal brain mass 06/09/2015 confirmed metastatic small cell carcinoma              Staging PET scan at [facility] 07/22/2015 revealed decreased activity associated with previously seen pulmonary nodules ,                           decreased activity of hypermetabolic mediastinal and bilateral hilar nodes , no new sites of FDG avid metastatic disease              compared to a [facility] PET scan from November 2016  2.    Chronic history of mediastinal lymphadenopathy and pulmonary nodules- diagnostic workup in 2013 including bronchoscopy/          EBUS , and mediastinoscopy -negative for malignancy, , granulomatous changes noted  3.     Hypertension  4.      History of CAD    Disposition:    Ms. Huck has been diagnosed with extensive stage small cell lung cancer. She underwent resection of a cystic mass in the right temporal brain. She now appears asymptomatic.   I recommended she completed whole brain radiation as suggested by Dr. Moody. She declines radiation.   We discussed systemic treatment options. I recommend standard chemotherapy with etoposide/carboplatin.  She asked about immunotherapy. I explained this is not yet a standard first-line treatment for small cell lung cancer , though she may be eligible for a clinical trial in the future. We discussed the expected survival benefit associated with systemic chemotherapy in patients with extensive stage small cell lung cancer.   it is unclear how much of the hypermetabolic disease in the chest is related to granulomatous disease versus small cell lung cancer. The dominant right lung nodule is most likely a primary tumor site.   She agrees  to proceed with a trial of etoposide/carboplatin if Dr. Ready is in agreement.  We reviewed the potential toxicities associated with the etoposide/carboplatin regimen including the chance for nausea/vomiting, mucositis, alopecia, diarrhea, and hematologic toxicity. We discussed the potential for an allergic reaction.   she will attend a chemotherapy teaching class today. We will schedule a first cycle of chemotherapy to begin within the next one to 2 weeks.   Approximately 50 minutes were spent with the patient today. The majority of the time was used for counseling and coordination of care.  , , MD  08/03/2015, 11:53 AM    

## 2015-08-03 NOTE — Telephone Encounter (Signed)
appt made and avs printed °

## 2015-08-03 NOTE — Telephone Encounter (Signed)
No additional note

## 2015-08-03 NOTE — Patient Instructions (Addendum)
Etoposide, VP-16 injection  What is this medicine?  ETOPOSIDE, VP-16 (e toe POE side) is a chemotherapy drug. It is used to treat testicular cancer, lung cancer, and other cancers.  This medicine may be used for other purposes; ask your health care provider or pharmacist if you have questions.  What should I tell my health care provider before I take this medicine?  They need to know if you have any of these conditions:  -infection  -kidney disease  -low blood counts, like low white cell, platelet, or red cell counts  -an unusual or allergic reaction to etoposide, other chemotherapeutic agents, other medicines, foods, dyes, or preservatives  -pregnant or trying to get pregnant  -breast-feeding  How should I use this medicine?  This medicine is for infusion into a vein. It is administered in a hospital or clinic by a specially trained health care professional.  Talk to your pediatrician regarding the use of this medicine in children. Special care may be needed.  Overdosage: If you think you have taken too much of this medicine contact a poison control center or emergency room at once.  NOTE: This medicine is only for you. Do not share this medicine with others.  What if I miss a dose?  It is important not to miss your dose. Call your doctor or health care professional if you are unable to keep an appointment.  What may interact with this medicine?  -aspirin  -certain medications for seizures like carbamazepine, phenobarbital, phenytoin, valproic acid  -cyclosporine  -levamisole  -warfarin  This list may not describe all possible interactions. Give your health care provider a list of all the medicines, herbs, non-prescription drugs, or dietary supplements you use. Also tell them if you smoke, drink alcohol, or use illegal drugs. Some items may interact with your medicine.  What should I watch for while using this medicine?  Visit your doctor for checks on your progress. This drug may make you feel generally unwell.  This is not uncommon, as chemotherapy can affect healthy cells as well as cancer cells. Report any side effects. Continue your course of treatment even though you feel ill unless your doctor tells you to stop.  In some cases, you may be given additional medicines to help with side effects. Follow all directions for their use.  Call your doctor or health care professional for advice if you get a fever, chills or sore throat, or other symptoms of a cold or flu. Do not treat yourself. This drug decreases your body's ability to fight infections. Try to avoid being around people who are sick.  This medicine may increase your risk to bruise or bleed. Call your doctor or health care professional if you notice any unusual bleeding.  Be careful brushing and flossing your teeth or using a toothpick because you may get an infection or bleed more easily. If you have any dental work done, tell your dentist you are receiving this medicine.  Avoid taking products that contain aspirin, acetaminophen, ibuprofen, naproxen, or ketoprofen unless instructed by your doctor. These medicines may hide a fever.  Do not become pregnant while taking this medicine or for at least 6 months after stopping it. Women should inform their doctor if they wish to become pregnant or think they might be pregnant. Women of child-bearing potential will need to have a negative pregnancy test before starting this medicine. There is a potential for serious side effects to an unborn child. Talk to your health care professional or   pharmacist for more information. Do not breast-feed an infant while taking this medicine.  Men must use a latex condom during sexual contact with a woman while taking this medicine and for at least 4 months after stopping it. A latex condom is needed even if you have had a vasectomy. Contact your doctor right away if your partner becomes pregnant. Do not donate sperm while taking this medicine and for at least 4 months after you stop  taking this medicine. Men should inform their doctors if they wish to father a child. This medicine may lower sperm counts.  What side effects may I notice from receiving this medicine?  Side effects that you should report to your doctor or health care professional as soon as possible:  -allergic reactions like skin rash, itching or hives, swelling of the face, lips, or tongue  -low blood counts - this medicine may decrease the number of white blood cells, red blood cells and platelets. You may be at increased risk for infections and bleeding.  -signs of infection - fever or chills, cough, sore throat, pain or difficulty passing urine  -signs of decreased platelets or bleeding - bruising, pinpoint red spots on the skin, black, tarry stools, blood in the urine  -signs of decreased red blood cells - unusually weak or tired, fainting spells, lightheadedness  -breathing problems  -changes in vision  -mouth or throat sores or ulcers  -pain, redness, swelling or irritation at the injection site  -pain, tingling, numbness in the hands or feet  -redness, blistering, peeling or loosening of the skin, including inside the mouth  -seizures  -vomiting  Side effects that usually do not require medical attention (report to your doctor or health care professional if they continue or are bothersome):  -diarrhea  -hair loss  -loss of appetite  -nausea  -stomach pain  This list may not describe all possible side effects. Call your doctor for medical advice about side effects. You may report side effects to FDA at 1-800-FDA-1088.  Where should I keep my medicine?  This drug is given in a hospital or clinic and will not be stored at home.  NOTE: This sheet is a summary. It may not cover all possible information. If you have questions about this medicine, talk to your doctor, pharmacist, or health care provider.      2016, Elsevier/Gold Standard. (2014-01-08 12:32:50)  Carboplatin injection  What is this medicine?  CARBOPLATIN (KAR boe  pla tin) is a chemotherapy drug. It targets fast dividing cells, like cancer cells, and causes these cells to die. This medicine is used to treat ovarian cancer and many other cancers.  This medicine may be used for other purposes; ask your health care provider or pharmacist if you have questions.  What should I tell my health care provider before I take this medicine?  They need to know if you have any of these conditions:  -blood disorders  -hearing problems  -kidney disease  -recent or ongoing radiation therapy  -an unusual or allergic reaction to carboplatin, cisplatin, other chemotherapy, other medicines, foods, dyes, or preservatives  -pregnant or trying to get pregnant  -breast-feeding  How should I use this medicine?  This drug is usually given as an infusion into a vein. It is administered in a hospital or clinic by a specially trained health care professional.  Talk to your pediatrician regarding the use of this medicine in children. Special care may be needed.  Overdosage: If you think you have taken   too much of this medicine contact a poison control center or emergency room at once.  NOTE: This medicine is only for you. Do not share this medicine with others.  What if I miss a dose?  It is important not to miss a dose. Call your doctor or health care professional if you are unable to keep an appointment.  What may interact with this medicine?  -medicines for seizures  -medicines to increase blood counts like filgrastim, pegfilgrastim, sargramostim  -some antibiotics like amikacin, gentamicin, neomycin, streptomycin, tobramycin  -vaccines  Talk to your doctor or health care professional before taking any of these medicines:  -acetaminophen  -aspirin  -ibuprofen  -ketoprofen  -naproxen  This list may not describe all possible interactions. Give your health care provider a list of all the medicines, herbs, non-prescription drugs, or dietary supplements you use. Also tell them if you smoke, drink alcohol, or  use illegal drugs. Some items may interact with your medicine.  What should I watch for while using this medicine?  Your condition will be monitored carefully while you are receiving this medicine. You will need important blood work done while you are taking this medicine.  This drug may make you feel generally unwell. This is not uncommon, as chemotherapy can affect healthy cells as well as cancer cells. Report any side effects. Continue your course of treatment even though you feel ill unless your doctor tells you to stop.  In some cases, you may be given additional medicines to help with side effects. Follow all directions for their use.  Call your doctor or health care professional for advice if you get a fever, chills or sore throat, or other symptoms of a cold or flu. Do not treat yourself. This drug decreases your body's ability to fight infections. Try to avoid being around people who are sick.  This medicine may increase your risk to bruise or bleed. Call your doctor or health care professional if you notice any unusual bleeding.  Be careful brushing and flossing your teeth or using a toothpick because you may get an infection or bleed more easily. If you have any dental work done, tell your dentist you are receiving this medicine.  Avoid taking products that contain aspirin, acetaminophen, ibuprofen, naproxen, or ketoprofen unless instructed by your doctor. These medicines may hide a fever.  Do not become pregnant while taking this medicine. Women should inform their doctor if they wish to become pregnant or think they might be pregnant. There is a potential for serious side effects to an unborn child. Talk to your health care professional or pharmacist for more information. Do not breast-feed an infant while taking this medicine.  What side effects may I notice from receiving this medicine?  Side effects that you should report to your doctor or health care professional as soon as possible:  -allergic  reactions like skin rash, itching or hives, swelling of the face, lips, or tongue  -signs of infection - fever or chills, cough, sore throat, pain or difficulty passing urine  -signs of decreased platelets or bleeding - bruising, pinpoint red spots on the skin, black, tarry stools, nosebleeds  -signs of decreased red blood cells - unusually weak or tired, fainting spells, lightheadedness  -breathing problems  -changes in hearing  -changes in vision  -chest pain  -high blood pressure  -low blood counts - This drug may decrease the number of white blood cells, red blood cells and platelets. You may be at increased risk for   infections and bleeding.  -nausea and vomiting  -pain, swelling, redness or irritation at the injection site  -pain, tingling, numbness in the hands or feet  -problems with balance, talking, walking  -trouble passing urine or change in the amount of urine  Side effects that usually do not require medical attention (report to your doctor or health care professional if they continue or are bothersome):  -hair loss  -loss of appetite  -metallic taste in the mouth or changes in taste  This list may not describe all possible side effects. Call your doctor for medical advice about side effects. You may report side effects to FDA at 1-800-FDA-1088.  Where should I keep my medicine?  This drug is given in a hospital or clinic and will not be stored at home.  NOTE: This sheet is a summary. It may not cover all possible information. If you have questions about this medicine, talk to your doctor, pharmacist, or health care provider.      2016, Elsevier/Gold Standard. (2007-08-20 14:38:05)

## 2015-08-04 ENCOUNTER — Telehealth: Payer: Self-pay | Admitting: *Deleted

## 2015-08-04 ENCOUNTER — Ambulatory Visit: Payer: Medicare Other

## 2015-08-04 ENCOUNTER — Telehealth: Payer: Self-pay | Admitting: Oncology

## 2015-08-04 ENCOUNTER — Encounter: Payer: Self-pay | Admitting: Oncology

## 2015-08-04 NOTE — Telephone Encounter (Signed)
TC from patient requesting to change chemo education class appt that is currently scheduled for 08/06/15. Pt has an appt at [facility] that day and wants to re-schedule to hopefully the morning of 08/11/15.

## 2015-08-04 NOTE — Progress Notes (Signed)
Called pt to discuss copay assistance and [employer].  Her english isn't very good so she would like to speak in person so I will see her when she comes for her next appointment.

## 2015-08-04 NOTE — Telephone Encounter (Signed)
Spoke with patient to confirm appt change to 3/15 at 945 am

## 2015-08-04 NOTE — Telephone Encounter (Signed)
per pof  to sch pt appt-cld pt and left message of appt time & date on 3/10 @ 9

## 2015-08-05 ENCOUNTER — Ambulatory Visit: Payer: Medicare Other

## 2015-08-05 ENCOUNTER — Other Ambulatory Visit: Payer: Medicare Other

## 2015-08-06 ENCOUNTER — Encounter: Payer: Self-pay | Admitting: Radiation Oncology

## 2015-08-06 ENCOUNTER — Ambulatory Visit: Payer: Medicare Other

## 2015-08-06 ENCOUNTER — Other Ambulatory Visit: Payer: Medicare Other

## 2015-08-06 ENCOUNTER — Ambulatory Visit: Payer: Medicare Other | Admitting: Radiation Oncology

## 2015-08-09 ENCOUNTER — Ambulatory Visit: Payer: Medicare Other

## 2015-08-10 ENCOUNTER — Ambulatory Visit: Payer: Medicare Other | Admitting: Radiation Oncology

## 2015-08-10 ENCOUNTER — Ambulatory Visit: Payer: Medicare Other

## 2015-08-11 ENCOUNTER — Other Ambulatory Visit: Payer: Self-pay | Admitting: Nurse Practitioner

## 2015-08-11 ENCOUNTER — Encounter: Payer: Self-pay | Admitting: *Deleted

## 2015-08-11 ENCOUNTER — Ambulatory Visit: Payer: Medicare Other

## 2015-08-11 ENCOUNTER — Other Ambulatory Visit (HOSPITAL_BASED_OUTPATIENT_CLINIC_OR_DEPARTMENT_OTHER): Payer: Medicare Other

## 2015-08-11 ENCOUNTER — Other Ambulatory Visit: Payer: Medicare Other

## 2015-08-11 ENCOUNTER — Encounter: Payer: Self-pay | Admitting: Oncology

## 2015-08-11 DIAGNOSIS — C3491 Malignant neoplasm of unspecified part of right bronchus or lung: Secondary | ICD-10-CM

## 2015-08-11 DIAGNOSIS — C3411 Malignant neoplasm of upper lobe, right bronchus or lung: Secondary | ICD-10-CM | POA: Diagnosis not present

## 2015-08-11 DIAGNOSIS — C7931 Secondary malignant neoplasm of brain: Secondary | ICD-10-CM

## 2015-08-11 LAB — CBC WITH DIFFERENTIAL/PLATELET
BASO%: 0.6 % (ref 0.0–2.0)
BASOS ABS: 0 10*3/uL (ref 0.0–0.1)
EOS ABS: 0.4 10*3/uL (ref 0.0–0.5)
EOS%: 8.7 % — ABNORMAL HIGH (ref 0.0–7.0)
HCT: 43.4 % (ref 34.8–46.6)
HEMOGLOBIN: 14.6 g/dL (ref 11.6–15.9)
LYMPH%: 19.4 % (ref 14.0–49.7)
MCH: 30.5 pg (ref 25.1–34.0)
MCHC: 33.6 g/dL (ref 31.5–36.0)
MCV: 90.6 fL (ref 79.5–101.0)
MONO#: 0.7 10*3/uL (ref 0.1–0.9)
MONO%: 14.1 % — AB (ref 0.0–14.0)
NEUT#: 2.8 10*3/uL (ref 1.5–6.5)
NEUT%: 57.2 % (ref 38.4–76.8)
PLATELETS: 174 10*3/uL (ref 145–400)
RBC: 4.79 10*6/uL (ref 3.70–5.45)
RDW: 14 % (ref 11.2–14.5)
WBC: 5 10*3/uL (ref 3.9–10.3)
lymph#: 1 10*3/uL (ref 0.9–3.3)

## 2015-08-11 LAB — COMPREHENSIVE METABOLIC PANEL
ALT: 59 U/L — AB (ref 0–55)
ANION GAP: 10 meq/L (ref 3–11)
AST: 25 U/L (ref 5–34)
Albumin: 3.8 g/dL (ref 3.5–5.0)
Alkaline Phosphatase: 117 U/L (ref 40–150)
BUN: 17.7 mg/dL (ref 7.0–26.0)
CHLORIDE: 103 meq/L (ref 98–109)
CO2: 31 meq/L — AB (ref 22–29)
CREATININE: 0.9 mg/dL (ref 0.6–1.1)
Calcium: 9.2 mg/dL (ref 8.4–10.4)
EGFR: 64 mL/min/{1.73_m2} — ABNORMAL LOW (ref >90)
Glucose: 114 mg/dl (ref 70–140)
Potassium: 3.3 mEq/L — ABNORMAL LOW (ref 3.5–5.1)
Sodium: 144 mEq/L (ref 136–145)
Total Bilirubin: 0.44 mg/dL (ref 0.20–1.20)
Total Protein: 6.8 g/dL (ref 6.4–8.3)

## 2015-08-11 MED ORDER — PROCHLORPERAZINE MALEATE 5 MG PO TABS: 5.0000 mg | ORAL_TABLET | Freq: Four times a day (QID) | ORAL | Status: DC | PRN

## 2015-08-11 NOTE — Progress Notes (Signed)
Met w/ pt regarding copay assistance.  Unfortunately there aren't any foundations offering copay assistance for her Dx so I offered the [employer], went over what it will & will not cover & gave her an expense sheet.  She will bring her proof of income on 08/16/15 to see if she qualifies.

## 2015-08-12 ENCOUNTER — Ambulatory Visit: Payer: Medicare Other | Admitting: Oncology

## 2015-08-12 ENCOUNTER — Ambulatory Visit: Payer: Medicare Other

## 2015-08-12 ENCOUNTER — Telehealth: Payer: Self-pay | Admitting: *Deleted

## 2015-08-12 NOTE — Telephone Encounter (Signed)
No additional note

## 2015-08-13 ENCOUNTER — Ambulatory Visit: Payer: Medicare Other

## 2015-08-13 ENCOUNTER — Ambulatory Visit: Payer: Medicare Other | Admitting: Radiation Oncology

## 2015-08-13 ENCOUNTER — Other Ambulatory Visit: Payer: Self-pay | Admitting: *Deleted

## 2015-08-13 ENCOUNTER — Telehealth: Payer: Self-pay | Admitting: Oncology

## 2015-08-13 MED ORDER — ONDANSETRON HCL 8 MG PO TABS: 8.0000 mg | ORAL_TABLET | Freq: Three times a day (TID) | ORAL | Status: DC | PRN

## 2015-08-13 NOTE — Telephone Encounter (Signed)
returned call and lvm for pt confirming that 3.22 appt moved to afternoon per the pt request

## 2015-08-13 NOTE — Telephone Encounter (Signed)
Received fax from [employer] notifying MD of possible interaction between Compazine and Requip. Reviewed by Dr. Benay Spice. Order received for Zofran. Rx called to pharmacy, Compazine order canceled. Pt notified. She had not picked up med yet, voiced appreciation for call.

## 2015-08-15 ENCOUNTER — Other Ambulatory Visit: Payer: Self-pay | Admitting: Oncology

## 2015-08-15 DIAGNOSIS — C3491 Malignant neoplasm of unspecified part of right bronchus or lung: Secondary | ICD-10-CM

## 2015-08-16 ENCOUNTER — Encounter: Payer: Self-pay | Admitting: Oncology

## 2015-08-16 ENCOUNTER — Ambulatory Visit (HOSPITAL_BASED_OUTPATIENT_CLINIC_OR_DEPARTMENT_OTHER): Payer: Medicare Other

## 2015-08-16 DIAGNOSIS — Z5111 Encounter for antineoplastic chemotherapy: Secondary | ICD-10-CM | POA: Diagnosis not present

## 2015-08-16 DIAGNOSIS — C3491 Malignant neoplasm of unspecified part of right bronchus or lung: Secondary | ICD-10-CM

## 2015-08-16 MED ORDER — PROCHLORPERAZINE MALEATE 10 MG PO TABS: 5.0000 mg | ORAL_TABLET | Freq: Four times a day (QID) | ORAL | Status: DC | PRN

## 2015-08-16 MED ORDER — SODIUM CHLORIDE 0.9 % IV SOLN
Freq: Once | INTRAVENOUS | Status: AC
  Administered 2015-08-16: 15:00:00 via INTRAVENOUS

## 2015-08-16 MED ORDER — PALONOSETRON HCL INJECTION 0.25 MG/5ML
INTRAVENOUS | Status: AC
  Filled 2015-08-16: qty 5

## 2015-08-16 MED ORDER — PALONOSETRON HCL INJECTION 0.25 MG/5ML
0.2500 mg | Freq: Once | INTRAVENOUS | Status: AC
  Administered 2015-08-16: 0.25 mg via INTRAVENOUS

## 2015-08-16 MED ORDER — SODIUM CHLORIDE 0.9 % IV SOLN
10.0000 mg | Freq: Once | INTRAVENOUS | Status: AC
  Administered 2015-08-16: 10 mg via INTRAVENOUS
  Filled 2015-08-16: qty 1

## 2015-08-16 MED ORDER — SODIUM CHLORIDE 0.9 % IV SOLN
80.0000 mg/m2 | Freq: Once | INTRAVENOUS | Status: AC
  Administered 2015-08-16: 150 mg via INTRAVENOUS
  Filled 2015-08-16: qty 7.5

## 2015-08-16 MED ORDER — CARBOPLATIN CHEMO INJECTION 600 MG/60ML
413.5000 mg | Freq: Once | INTRAVENOUS | Status: AC
  Administered 2015-08-16: 410 mg via INTRAVENOUS
  Filled 2015-08-16: qty 41

## 2015-08-16 NOTE — Patient Instructions (Addendum)
[facility] Discharge Instructions for Patients Receiving Chemotherapy  Today you received the following chemotherapy agents: Carboplatin & Etoposide (VP-16).  To help prevent nausea and vomiting after your treatment, we encourage you to take your nausea medication, Zofran, 1 tablet (8 mg total) by mouth every 8 (eight) hours as needed.     If you develop nausea and vomiting that is not controlled by your nausea medication, call the clinic.   BELOW ARE SYMPTOMS THAT SHOULD BE REPORTED IMMEDIATELY:  *FEVER GREATER THAN 100.5 F  *CHILLS WITH OR WITHOUT FEVER  NAUSEA AND VOMITING THAT IS NOT CONTROLLED WITH YOUR NAUSEA MEDICATION  *UNUSUAL SHORTNESS OF BREATH  *UNUSUAL BRUISING OR BLEEDING  TENDERNESS IN MOUTH AND THROAT WITH OR WITHOUT PRESENCE OF ULCERS  *URINARY PROBLEMS  *BOWEL PROBLEMS  UNUSUAL RASH Items with * indicate a potential emergency and should be followed up as soon as possible.  Feel free to call the clinic you have any questions or concerns. The clinic phone number is [phone].  Please show the [facility] at check-in to the Emergency Department and triage nurse.  Etoposide, VP-16 injection What is this medicine? ETOPOSIDE, VP-16 (e toe POE side) is a chemotherapy drug. It is used to treat testicular cancer, lung cancer, and other cancers. This medicine may be used for other purposes; ask your health care provider or pharmacist if you have questions. What should I tell my health care provider before I take this medicine? They need to know if you have any of these conditions: -infection -kidney disease -low blood counts, like low white cell, platelet, or red cell counts -an unusual or allergic reaction to etoposide, other chemotherapeutic agents, other medicines, foods, dyes, or preservatives -pregnant or trying to get pregnant -breast-feeding How should I use this medicine? This medicine is for infusion into a vein. It is  administered in a hospital or clinic by a specially trained health care professional. Talk to your pediatrician regarding the use of this medicine in children. Special care may be needed. Overdosage: If you think you have taken too much of this medicine contact a poison control center or emergency room at once. NOTE: This medicine is only for you. Do not share this medicine with others. What if I miss a dose? It is important not to miss your dose. Call your doctor or health care professional if you are unable to keep an appointment. What may interact with this medicine? -aspirin -certain medications for seizures like carbamazepine, phenobarbital, phenytoin, valproic acid -cyclosporine -levamisole -warfarin This list may not describe all possible interactions. Give your health care provider a list of all the medicines, herbs, non-prescription drugs, or dietary supplements you use. Also tell them if you smoke, drink alcohol, or use illegal drugs. Some items may interact with your medicine. What should I watch for while using this medicine? Visit your doctor for checks on your progress. This drug may make you feel generally unwell. This is not uncommon, as chemotherapy can affect healthy cells as well as cancer cells. Report any side effects. Continue your course of treatment even though you feel ill unless your doctor tells you to stop. In some cases, you may be given additional medicines to help with side effects. Follow all directions for their use. Call your doctor or health care professional for advice if you get a fever, chills or sore throat, or other symptoms of a cold or flu. Do not treat yourself. This drug decreases your body's ability to fight  infections. Try to avoid being around people who are sick. This medicine may increase your risk to bruise or bleed. Call your doctor or health care professional if you notice any unusual bleeding. Be careful brushing and flossing your teeth or using  a toothpick because you may get an infection or bleed more easily. If you have any dental work done, tell your dentist you are receiving this medicine. Avoid taking products that contain aspirin, acetaminophen, ibuprofen, naproxen, or ketoprofen unless instructed by your doctor. These medicines may hide a fever. Do not become pregnant while taking this medicine or for at least 6 months after stopping it. Women should inform their doctor if they wish to become pregnant or think they might be pregnant. Women of child-bearing potential will need to have a negative pregnancy test before starting this medicine. There is a potential for serious side effects to an unborn child. Talk to your health care professional or pharmacist for more information. Do not breast-feed an infant while taking this medicine. Men must use a latex condom during sexual contact with a woman while taking this medicine and for at least 4 months after stopping it. A latex condom is needed even if you have had a vasectomy. Contact your doctor right away if your partner becomes pregnant. Do not donate sperm while taking this medicine and for at least 4 months after you stop taking this medicine. Men should inform their doctors if they wish to father a child. This medicine may lower sperm counts. What side effects may I notice from receiving this medicine? Side effects that you should report to your doctor or health care professional as soon as possible: -allergic reactions like skin rash, itching or hives, swelling of the face, lips, or tongue -low blood counts - this medicine may decrease the number of white blood cells, red blood cells and platelets. You may be at increased risk for infections and bleeding. -signs of infection - fever or chills, cough, sore throat, pain or difficulty passing urine -signs of decreased platelets or bleeding - bruising, pinpoint red spots on the skin, black, tarry stools, blood in the urine -signs of  decreased red blood cells - unusually weak or tired, fainting spells, lightheadedness -breathing problems -changes in vision -mouth or throat sores or ulcers -pain, redness, swelling or irritation at the injection site -pain, tingling, numbness in the hands or feet -redness, blistering, peeling or loosening of the skin, including inside the mouth -seizures -vomiting Side effects that usually do not require medical attention (report to your doctor or health care professional if they continue or are bothersome): -diarrhea -hair loss -loss of appetite -nausea -stomach pain This list may not describe all possible side effects. Call your doctor for medical advice about side effects. You may report side effects to FDA at 1-800-FDA-1088. Where should I keep my medicine? This drug is given in a hospital or clinic and will not be stored at home. NOTE: This sheet is a summary. It may not cover all possible information. If you have questions about this medicine, talk to your doctor, pharmacist, or health care provider.    2016, Elsevier/Gold Standard. (2014-01-08 12:32:50)  Carboplatin injection What is this medicine? CARBOPLATIN (KAR boe pla tin) is a chemotherapy drug. It targets fast dividing cells, like cancer cells, and causes these cells to die. This medicine is used to treat ovarian cancer and many other cancers. This medicine may be used for other purposes; ask your health care provider or pharmacist if you  have questions. What should I tell my health care provider before I take this medicine? They need to know if you have any of these conditions: -blood disorders -hearing problems -kidney disease -recent or ongoing radiation therapy -an unusual or allergic reaction to carboplatin, cisplatin, other chemotherapy, other medicines, foods, dyes, or preservatives -pregnant or trying to get pregnant -breast-feeding How should I use this medicine? This drug is usually given as an infusion  into a vein. It is administered in a hospital or clinic by a specially trained health care professional. Talk to your pediatrician regarding the use of this medicine in children. Special care may be needed. Overdosage: If you think you have taken too much of this medicine contact a poison control center or emergency room at once. NOTE: This medicine is only for you. Do not share this medicine with others. What if I miss a dose? It is important not to miss a dose. Call your doctor or health care professional if you are unable to keep an appointment. What may interact with this medicine? -medicines for seizures -medicines to increase blood counts like filgrastim, pegfilgrastim, sargramostim -some antibiotics like amikacin, gentamicin, neomycin, streptomycin, tobramycin -vaccines Talk to your doctor or health care professional before taking any of these medicines: -acetaminophen -aspirin -ibuprofen -ketoprofen -naproxen This list may not describe all possible interactions. Give your health care provider a list of all the medicines, herbs, non-prescription drugs, or dietary supplements you use. Also tell them if you smoke, drink alcohol, or use illegal drugs. Some items may interact with your medicine. What should I watch for while using this medicine? Your condition will be monitored carefully while you are receiving this medicine. You will need important blood work done while you are taking this medicine. This drug may make you feel generally unwell. This is not uncommon, as chemotherapy can affect healthy cells as well as cancer cells. Report any side effects. Continue your course of treatment even though you feel ill unless your doctor tells you to stop. In some cases, you may be given additional medicines to help with side effects. Follow all directions for their use. Call your doctor or health care professional for advice if you get a fever, chills or sore throat, or other symptoms of a cold  or flu. Do not treat yourself. This drug decreases your body's ability to fight infections. Try to avoid being around people who are sick. This medicine may increase your risk to bruise or bleed. Call your doctor or health care professional if you notice any unusual bleeding. Be careful brushing and flossing your teeth or using a toothpick because you may get an infection or bleed more easily. If you have any dental work done, tell your dentist you are receiving this medicine. Avoid taking products that contain aspirin, acetaminophen, ibuprofen, naproxen, or ketoprofen unless instructed by your doctor. These medicines may hide a fever. Do not become pregnant while taking this medicine. Women should inform their doctor if they wish to become pregnant or think they might be pregnant. There is a potential for serious side effects to an unborn child. Talk to your health care professional or pharmacist for more information. Do not breast-feed an infant while taking this medicine. What side effects may I notice from receiving this medicine? Side effects that you should report to your doctor or health care professional as soon as possible: -allergic reactions like skin rash, itching or hives, swelling of the face, lips, or tongue -signs of infection - fever or  chills, cough, sore throat, pain or difficulty passing urine -signs of decreased platelets or bleeding - bruising, pinpoint red spots on the skin, black, tarry stools, nosebleeds -signs of decreased red blood cells - unusually weak or tired, fainting spells, lightheadedness -breathing problems -changes in hearing -changes in vision -chest pain -high blood pressure -low blood counts - This drug may decrease the number of white blood cells, red blood cells and platelets. You may be at increased risk for infections and bleeding. -nausea and vomiting -pain, swelling, redness or irritation at the injection site -pain, tingling, numbness in the hands or  feet -problems with balance, talking, walking -trouble passing urine or change in the amount of urine Side effects that usually do not require medical attention (report to your doctor or health care professional if they continue or are bothersome): -hair loss -loss of appetite -metallic taste in the mouth or changes in taste This list may not describe all possible side effects. Call your doctor for medical advice about side effects. You may report side effects to FDA at 1-800-FDA-1088. Where should I keep my medicine? This drug is given in a hospital or clinic and will not be stored at home. NOTE: This sheet is a summary. It may not cover all possible information. If you have questions about this medicine, talk to your doctor, pharmacist, or health care provider.    2016, Elsevier/Gold Standard. (2007-08-20 14:38:05)

## 2015-08-16 NOTE — Progress Notes (Signed)
Pt is approved for the $400 [facility] grant.  °

## 2015-08-17 ENCOUNTER — Other Ambulatory Visit: Payer: Medicare Other

## 2015-08-17 ENCOUNTER — Ambulatory Visit: Payer: Medicare Other | Admitting: Oncology

## 2015-08-17 ENCOUNTER — Ambulatory Visit (HOSPITAL_BASED_OUTPATIENT_CLINIC_OR_DEPARTMENT_OTHER): Payer: Medicare Other

## 2015-08-17 ENCOUNTER — Ambulatory Visit: Payer: Medicare Other

## 2015-08-17 VITALS — BP 162/91 | HR 63 | Temp 98.2°F | Resp 16

## 2015-08-17 DIAGNOSIS — C3411 Malignant neoplasm of upper lobe, right bronchus or lung: Secondary | ICD-10-CM | POA: Diagnosis not present

## 2015-08-17 DIAGNOSIS — Z5111 Encounter for antineoplastic chemotherapy: Secondary | ICD-10-CM

## 2015-08-17 DIAGNOSIS — C3491 Malignant neoplasm of unspecified part of right bronchus or lung: Secondary | ICD-10-CM

## 2015-08-17 MED ORDER — SODIUM CHLORIDE 0.9 % IV SOLN
Freq: Once | INTRAVENOUS | Status: AC
  Administered 2015-08-17: 09:00:00 via INTRAVENOUS

## 2015-08-17 MED ORDER — SODIUM CHLORIDE 0.9 % IV SOLN
80.0000 mg/m2 | Freq: Once | INTRAVENOUS | Status: AC
  Administered 2015-08-17: 150 mg via INTRAVENOUS
  Filled 2015-08-17: qty 7.5

## 2015-08-17 MED ORDER — SODIUM CHLORIDE 0.9 % IV SOLN
10.0000 mg | Freq: Once | INTRAVENOUS | Status: AC
  Administered 2015-08-17: 10 mg via INTRAVENOUS
  Filled 2015-08-17: qty 1

## 2015-08-17 NOTE — Patient Instructions (Signed)
[facility] Discharge Instructions for Patients Receiving Chemotherapy  Today you received the following chemotherapy agents Etoposide.   To help prevent nausea and vomiting after your treatment, we encourage you to take your nausea medication as prescribed.   If you develop nausea and vomiting that is not controlled by your nausea medication, call the clinic.   BELOW ARE SYMPTOMS THAT SHOULD BE REPORTED IMMEDIATELY:  *FEVER GREATER THAN 100.5 F  *CHILLS WITH OR WITHOUT FEVER  NAUSEA AND VOMITING THAT IS NOT CONTROLLED WITH YOUR NAUSEA MEDICATION  *UNUSUAL SHORTNESS OF BREATH  *UNUSUAL BRUISING OR BLEEDING  TENDERNESS IN MOUTH AND THROAT WITH OR WITHOUT PRESENCE OF ULCERS  *URINARY PROBLEMS  *BOWEL PROBLEMS  UNUSUAL RASH Items with * indicate a potential emergency and should be followed up as soon as possible.  Feel free to call the clinic you have any questions or concerns. The clinic phone number is [phone].  Please show the CHEMO ALERT CARD at check-in to the Emergency Department and triage nurse.   

## 2015-08-18 ENCOUNTER — Ambulatory Visit: Payer: Medicare Other

## 2015-08-18 ENCOUNTER — Telehealth: Payer: Self-pay | Admitting: *Deleted

## 2015-08-18 ENCOUNTER — Ambulatory Visit (HOSPITAL_BASED_OUTPATIENT_CLINIC_OR_DEPARTMENT_OTHER): Payer: Medicare Other

## 2015-08-18 VITALS — BP 171/71 | HR 53 | Temp 96.8°F | Resp 20

## 2015-08-18 DIAGNOSIS — C3491 Malignant neoplasm of unspecified part of right bronchus or lung: Secondary | ICD-10-CM

## 2015-08-18 DIAGNOSIS — Z5111 Encounter for antineoplastic chemotherapy: Secondary | ICD-10-CM

## 2015-08-18 MED ORDER — SODIUM CHLORIDE 0.9 % IV SOLN
80.0000 mg/m2 | Freq: Once | INTRAVENOUS | Status: AC
  Administered 2015-08-18: 150 mg via INTRAVENOUS
  Filled 2015-08-18: qty 7.5

## 2015-08-18 MED ORDER — SODIUM CHLORIDE 0.9 % IV SOLN
Freq: Once | INTRAVENOUS | Status: AC
  Administered 2015-08-18: 15:00:00 via INTRAVENOUS

## 2015-08-18 MED ORDER — SODIUM CHLORIDE 0.9 % IV SOLN
10.0000 mg | Freq: Once | INTRAVENOUS | Status: AC
  Administered 2015-08-18: 10 mg via INTRAVENOUS
  Filled 2015-08-18: qty 1

## 2015-08-18 NOTE — Patient Instructions (Signed)
[facility] Discharge Instructions for Patients Receiving Chemotherapy  Today you received the following chemotherapy agents Etoposide.   To help prevent nausea and vomiting after your treatment, we encourage you to take your nausea medication as prescribed.   If you develop nausea and vomiting that is not controlled by your nausea medication, call the clinic.   BELOW ARE SYMPTOMS THAT SHOULD BE REPORTED IMMEDIATELY:  *FEVER GREATER THAN 100.5 F  *CHILLS WITH OR WITHOUT FEVER  NAUSEA AND VOMITING THAT IS NOT CONTROLLED WITH YOUR NAUSEA MEDICATION  *UNUSUAL SHORTNESS OF BREATH  *UNUSUAL BRUISING OR BLEEDING  TENDERNESS IN MOUTH AND THROAT WITH OR WITHOUT PRESENCE OF ULCERS  *URINARY PROBLEMS  *BOWEL PROBLEMS  UNUSUAL RASH Items with * indicate a potential emergency and should be followed up as soon as possible.  Feel free to call the clinic you have any questions or concerns. The clinic phone number is [phone].  Please show the CHEMO ALERT CARD at check-in to the Emergency Department and triage nurse.   

## 2015-08-18 NOTE — Telephone Encounter (Signed)
Received Drug-Drug interaction report from [employer]. Possible interaction between Compazine and Requip. Discussed with Theadora Rama, PharmD: Compazine can decrease efficacy of Requip. Taking both will not endanger pt.

## 2015-08-19 ENCOUNTER — Other Ambulatory Visit: Payer: Medicare Other

## 2015-08-19 ENCOUNTER — Ambulatory Visit: Payer: Medicare Other | Admitting: Oncology

## 2015-08-20 ENCOUNTER — Ambulatory Visit (HOSPITAL_BASED_OUTPATIENT_CLINIC_OR_DEPARTMENT_OTHER): Payer: Medicare Other

## 2015-08-20 VITALS — BP 136/70 | HR 47 | Temp 97.9°F

## 2015-08-20 DIAGNOSIS — C3491 Malignant neoplasm of unspecified part of right bronchus or lung: Secondary | ICD-10-CM

## 2015-08-20 DIAGNOSIS — Z5189 Encounter for other specified aftercare: Secondary | ICD-10-CM | POA: Diagnosis not present

## 2015-08-20 MED ORDER — PEGFILGRASTIM INJECTION 6 MG/0.6ML ~~LOC~~
6.0000 mg | PREFILLED_SYRINGE | Freq: Once | SUBCUTANEOUS | Status: AC
  Administered 2015-08-20: 6 mg via SUBCUTANEOUS
  Filled 2015-08-20: qty 0.6

## 2015-08-26 ENCOUNTER — Ambulatory Visit (HOSPITAL_BASED_OUTPATIENT_CLINIC_OR_DEPARTMENT_OTHER): Payer: Medicare Other | Admitting: Oncology

## 2015-08-26 ENCOUNTER — Other Ambulatory Visit (HOSPITAL_BASED_OUTPATIENT_CLINIC_OR_DEPARTMENT_OTHER): Payer: Medicare Other

## 2015-08-26 ENCOUNTER — Ambulatory Visit: Payer: Medicare Other | Admitting: Cardiothoracic Surgery

## 2015-08-26 VITALS — BP 138/64 | HR 58 | Temp 98.4°F | Resp 18 | Ht 64.0 in | Wt 174.8 lb

## 2015-08-26 DIAGNOSIS — C3491 Malignant neoplasm of unspecified part of right bronchus or lung: Secondary | ICD-10-CM | POA: Diagnosis not present

## 2015-08-26 DIAGNOSIS — M545 Low back pain: Secondary | ICD-10-CM

## 2015-08-26 DIAGNOSIS — C3490 Malignant neoplasm of unspecified part of unspecified bronchus or lung: Secondary | ICD-10-CM

## 2015-08-26 DIAGNOSIS — I10 Essential (primary) hypertension: Secondary | ICD-10-CM | POA: Diagnosis not present

## 2015-08-26 LAB — CBC WITH DIFFERENTIAL/PLATELET
BASO%: 0.7 % (ref 0.0–2.0)
BASOS ABS: 0.1 10*3/uL (ref 0.0–0.1)
EOS ABS: 0.2 10*3/uL (ref 0.0–0.5)
EOS%: 1.7 % (ref 0.0–7.0)
HCT: 40.2 % (ref 34.8–46.6)
HEMOGLOBIN: 13.1 g/dL (ref 11.6–15.9)
LYMPH%: 9.7 % — AB (ref 14.0–49.7)
MCH: 29.5 pg (ref 25.1–34.0)
MCHC: 32.6 g/dL (ref 31.5–36.0)
MCV: 90.5 fL (ref 79.5–101.0)
MONO#: 1.1 10*3/uL — AB (ref 0.1–0.9)
MONO%: 10.9 % (ref 0.0–14.0)
NEUT%: 77 % — ABNORMAL HIGH (ref 38.4–76.8)
NEUTROS ABS: 8.1 10*3/uL — AB (ref 1.5–6.5)
PLATELETS: 126 10*3/uL — AB (ref 145–400)
RBC: 4.44 10*6/uL (ref 3.70–5.45)
RDW: 14.1 % (ref 11.2–14.5)
WBC: 10.5 10*3/uL — AB (ref 3.9–10.3)
lymph#: 1 10*3/uL (ref 0.9–3.3)

## 2015-08-26 NOTE — Progress Notes (Signed)
  [facility] OFFICE PROGRESS NOTE   Diagnosis: Small cell lung cancer  INTERVAL HISTORY:   Margaret Hale completed a first cycle of etoposide/carboplatin beginning 08/16/2015. She received Neulasta 08/20/2015. She developed pain at the low back and abdomen on the evening of 08/20/2015 and 08/21/2015. She did not take pain medication. No further pain.   no nausea/vomiting, mouth sores, or symptoms of an allergic reaction. She feels well. Good appetite.  Objective:  Vital signs in last 24 hours:  Blood pressure 138/64, pulse 58, temperature 98.4 F (36.9 C), temperature source Oral, resp. rate 18, height '5\' 4"'$  (1.626 m), weight 174 lb 12.8 oz (79.289 kg), SpO2 96 %.    HEENT:  No thrush or ulcers Resp:  Scattered coarse rhonchi  , no respiratory distress Cardio:  Regular rate and rhythm GI:  No hepatomegaly, nontender Vascular:  No leg edema    Lab Results:  Lab Results  Component Value Date   WBC 10.5* 08/26/2015   HGB 13.1 08/26/2015   HCT 40.2 08/26/2015   MCV 90.5 08/26/2015   PLT 126* 08/26/2015   NEUTROABS 8.1* 08/26/2015     Medications: I have reviewed the patient's current medications.  Asessment/Plan:  1. Extensive stage small cell lung cancer  resection of right temporal brain mass 06/09/2015 confirmed metastatic small cell carcinoma  Staging PET scan at [facility] 07/22/2015 revealed decreased activity associated with previously seen pulmonary nodules , decreased activity of hypermetabolic mediastinal and bilateral hilar nodes , no new sites of FDG avid metastatic disease compared to a [facility] PET scan from November 2016   cycle 1 etoposide/carboplatin 08/16/2015  2. Chronic history of mediastinal lymphadenopathy and pulmonary nodules- diagnostic workup in 2013 including bronchoscopy/ EBUS , and mediastinoscopy -negative for malignancy, , granulomatous changes noted  3. Hypertension  4.  History of CAD   Disposition:   she tolerated the first cycle of chemotherapy well. The plan is to proceed with cycle 2 beginning 09/14/2015. She will be scheduled for a restaging chest CT after 3 cycles of chemotherapy. We discussed the unusual nature of the small cell cancer in her case. She understands it is likely the small cell carcinoma originated in the lung , though it is unusual that she does not have a dominant site of disease on imaging studies.   She will return for cycle 2 chemotherapy 09/14/2015. We will see her for an office visit and nadir CBC on 09/24/2015.  Betsy Coder, MD  08/26/2015  11:30 AM

## 2015-09-02 ENCOUNTER — Encounter: Payer: Medicare Other | Admitting: Cardiothoracic Surgery

## 2015-09-03 ENCOUNTER — Ambulatory Visit (INDEPENDENT_AMBULATORY_CARE_PROVIDER_SITE_OTHER): Payer: Medicare Other | Admitting: Cardiothoracic Surgery

## 2015-09-03 ENCOUNTER — Encounter: Payer: Self-pay | Admitting: Cardiothoracic Surgery

## 2015-09-03 VITALS — BP 157/77 | HR 60 | Resp 20 | Ht 64.0 in | Wt 174.0 lb

## 2015-09-03 DIAGNOSIS — C3490 Malignant neoplasm of unspecified part of unspecified bronchus or lung: Secondary | ICD-10-CM | POA: Diagnosis not present

## 2015-09-03 DIAGNOSIS — R918 Other nonspecific abnormal finding of lung field: Secondary | ICD-10-CM | POA: Diagnosis not present

## 2015-09-03 DIAGNOSIS — G939 Disorder of brain, unspecified: Secondary | ICD-10-CM

## 2015-09-03 DIAGNOSIS — G9389 Other specified disorders of brain: Secondary | ICD-10-CM

## 2015-09-03 NOTE — Progress Notes (Signed)
[address]       [place] [address]             [phone]         Margaret Hale Medical Record #[id number] Date of Birth: April 07, 1935  Referring: Ladell Pier, MD Primary Care: [name], MD  Chief Complaint:    Chief Complaint  Patient presents with  . Lung Lesion    3 month f/u with CT Chest- 07/12/15- [facility], PET Scan 07/22/15 @ [facility], Dr Benay Spice giving chemotherapy    History of Present Illness:    She had a previous biopsy of the left upper lobe lesion that was showed though some reactive changes and atypical cyst and some questionable atypical cells. Followup low-dose CT scan today showed that the lesions had not changed. The left upper lobe lesion was still 16 mm in the right upper lobe lesion was 10 mm. Her mediastinoscopy was negative and it showed granulomatous disease. The patient was seen by Dr [name] in May 2013 and was  not interested in any further biopsies or removal at the present time. Dr [name]  did explain to her that if  these lesions get bigger than resection will need to be done. She has had serial ct scans since that time. The left upper lobe lesion resolved. Recent CT scan done last week showed resolution of some lesions and increasing size of a right upper lobe lesion.   No anginal type pains, even with daily exercise denies angina. She denies cough hemoptysis or recurrent pulmonary infections. She stay off cigarettes since 2007 She does note some early morning sputum production.  Patient was  been seen by neurosurgery and resection of brain lesion right temporal lobe was done, confirming small cell cancer. Has had first cycle of chemo and declined having whole brain radiation   Current Activity/ Functional Status: Patient is independent with mobility/ambulation, transfers, ADL's, IADL's.   Past Medical History  Diagnosis Date  . Hyperlipidemia   . CAD (coronary artery disease) 2007    PCI of RCA, bare  metal stent.....Dr. Radford Pax  . Thyroid disease     nodules,stable on u/s 2/12  . Baker's cyst     L KNEE  . Vitamin D deficiency   . Disc disorder of lumbar region     2 BULDGING DISCS DX 1990 BY DR. DALLDORF  . No pertinent past medical history     lung mass  occas cough   . Dizzy   . Hypertension     Takes Novasc ,Clonidine, & Losartan  . Cancer (HCC)     lung mass  . Anxiety     Past Surgical History  Procedure Laterality Date  . Carpal tunnel release  06/26/08        04/20/09    RIGHT HAND          LEFT HAND  . Finger contracture release  8/12    Dr. Amedeo Plenty    right hand trigger finger release  . Eye surgery  01/07/09    LEFT CATARACT  . Eye surgery  01/21/09    RIGHT CATARACT  . Coronary angioplasty      dr h Tamala Julian [phone] with one stent  . Mediastinoscopy  08/02/2011    Procedure: MEDIASTINOSCOPY;  Surgeon: Nicanor Alcon, MD;  Location: [facility];  Service: Thoracic;  Laterality: N/A;  . Craniotomy Right 06/09/2015    Procedure: CRANIOTOMY TUMOR EXCISION WITH APPLICATION OF CRANIAL NAVIGATION;  Surgeon: Newman Pies, MD;  Location: [facility] NEURO ORS;  Service: Neurosurgery;  Laterality: Right;  Right temporal craniotomy for resection of tumor    Family History  Problem Relation Age of Onset  . Anesthesia problems Neg Hx   . Hypotension Neg Hx   . Malignant hyperthermia Neg Hx   . Pseudochol deficiency Neg Hx     Social History   Social History  . Marital Status: Widowed    Spouse Name: N/A  . Number of Children: 2  . Years of Education: N/A   Occupational History  . Retired    Social History Main Topics  . Smoking status: Former Smoker -- 0.50 packs/day for 10 years    Types: Cigarettes    Quit date: 07/12/2005  . Smokeless tobacco: Never Used  . Alcohol Use: No  . Drug Use: No  . Sexual Activity: Not on file   Other Topics Concern  . Not on file   Social History Narrative    History  Smoking status  . Former Smoker -- 0.50 packs/day for 10 years    . Types: Cigarettes  . Quit date: 07/12/2005  Smokeless tobacco  . Never Used    History  Alcohol Use No     Allergies  Allergen Reactions  . Food     Sausage, hot dogs, salami (rash)  . Lipitor [Atorvastatin Calcium] Other (See Comments)    MYALGIAS  . Pantoprazole Sodium Rash    Current Outpatient Prescriptions  Medication Sig Dispense Refill  . amLODipine (NORVASC) 10 MG tablet Take 10 mg by mouth every evening.     Marland Kitchen aspirin 81 MG tablet Take 81 mg by mouth daily.    . hydrochlorothiazide (HYDRODIURIL) 25 MG tablet Take 25 mg by mouth daily.    . metoprolol succinate (TOPROL-XL) 100 MG 24 hr tablet Take 100 mg by mouth daily. Take with or immediately following a meal.    . Omega-3 Fatty Acids (FISH OIL PO) Take 4,000 Units by mouth 2 (two) times daily.    Marland Kitchen rOPINIRole (REQUIP) 1 MG tablet Take 1 mg by mouth at bedtime.    . valsartan (DIOVAN) 320 MG tablet Take 320 mg by mouth daily.    . memantine (NAMENDA) 10 MG tablet Take 2 tablets (20 mg total) by mouth daily. (Patient not taking: Reported on 08/26/2015) 336 tablet 0  . ondansetron (ZOFRAN) 8 MG tablet Take 1 tablet (8 mg total) by mouth every 8 (eight) hours as needed for nausea or vomiting. (Patient not taking: Reported on 09/03/2015) 20 tablet 2  . prochlorperazine (COMPAZINE) 10 MG tablet Take 0.5 tablets (5 mg total) by mouth every 6 (six) hours as needed for nausea or vomiting. (Patient not taking: Reported on 09/03/2015) 20 tablet 1   No current facility-administered medications for this visit.       Review of Systems:     Cardiac Review of Systems: Y or N  Chest Pain [  n  ]  Resting SOB [n   ] Exertional SOB  [ n ]  Orthopnea [n  ]   Pedal Edema [ n  ]    Palpitations [ n ] Syncope  [n  ]   Presyncope [ n  ]  General Review of Systems: [Y] = yes [  ]=no Constitional: recent weight change [  ]; anorexia [  ]; fatigue [ n ]; nausea [n  ]; night sweats [ n ]; fever [ n ]; or chills [n  ];  Dental: poor dentition[n  ];   Eye : blurred vision [  ]; diplopia [   ]; vision changes [  ];  Amaurosis fugax[  ]; Resp: cough [ y ];  wheezing[ n ];  hemoptysis[n  ]; shortness of breath[n ]; paroxysmal nocturnal dyspnea[ n]; dyspnea on exertion[  ]; or orthopnea[  ];  GI:  gallstones[  ], vomiting[  ];  dysphagia[  ]; melena[  ];  hematochezia [  ]; heartburn[  ];   Hx of  Colonoscopy[ yes last year ]; GU: kidney stones [  ]; hematuria[  ];   dysuria [  ];  nocturia[  ];  history of     obstruction [  ];             Skin: rash, swelling[  ];, hair loss[  ];  peripheral edema[  ];  or itching[  ]; Musculosketetal: myalgias[  ];  joint swelling[  ];  joint erythema[  ];  joint pain[  ];  back pain[  ];  Heme/Lymph: bruising[  ];  bleeding[  ];  anemia[  ];  Neuro: TIA[  ];  headaches[  ];  stroke[no history of stroke  ];  vertigo[  ];  seizures[  ];   paresthesias[  ];  difficulty walking[  ]; restless legs at night better on medsy  Psych:depression[  ]; anxiety[  ];  Endocrine: diabetes[  ];  thyroid dysfunction[  ];  Immunizations: Flu [ yes ]; Pneumococcal[unknown  ];  Other:  Physical Exam: BP 157/77 mmHg  Pulse 60  Resp 20  Ht _0  (1.626 m)  Wt 174 lb (78.926 kg)  BMI 29.85 kg/m2  SpO2 98%  General appearance: alert, cooperative, appears stated age and no distress Neurologic: intact Heart: regular rate and rhythm, S1, S2 normal, no murmur, click, rub or gallop and normal apical impulse Lungs: clear to auscultation bilaterally and normal percussion bilaterally Abdomen: soft, non-tender; bowel sounds normal; no masses,  no organomegaly Extremities: extremities normal, atraumatic, no cyanosis or edema, Homans sign is negative, no sign of DVT and no edema, redness or tenderness in the calves or thighs no cervicall or axillary adenopathy No carotid bruits  Diagnostic  Studies & Laboratory data:     Recent Radiology Findings: CLINICAL DATA: Stage IV non-small cell lung cancer.  EXAM: CT CHEST WITH CONTRAST  TECHNIQUE: Multidetector CT imaging of the chest was performed during intravenous contrast administration.  CONTRAST: 20m OMNIPAQUE IOHEXOL 300 MG/ML SOLN  COMPARISON: PET-CT from 04/07/2015. Chest CT from 03/24/2015.  FINDINGS: Mediastinum / Lymph Nodes: Stable right thyroid nodules. There is no axillary lymphadenopathy. 7 mm short axis right paratracheal lymph node seen on the PET-CT measures 6 mm short axis today. 11 mm short axis precarinal lymph node on today's study was 11 mm previously. No left hilar lymphadenopathy. 10 mm short axis right hilar lymph node was not clearly visualized on either of the 2 prior exams but was seen to be hypermetabolic on the previous PET-CT. The heart size is normal. No pericardial effusion. Coronary artery calcification is noted. The esophagus has normal imaging features.  Lungs / Pleura: Biapical pleural-parenchymal scarring again noted. Index 9 x 12 mm right middle lobe nodule is 9 x 11 mm on image 31 series 5 today. The 16 x 14 mm posterior right upper lobe pulmonary nodule measured on the previous PET-CT has decreased in the interval now measuring 14 x 10 mm. 9 mm left upper lobe ground-glass nodule on image  29 is unchanged.  Upper Abdomen: No evidence for adrenal nodule or mass. Tiny probable cyst upper pole left kidney.  MSK / Soft Tissues: Bone windows reveal no worrisome lytic or sclerotic osseous lesions.  IMPRESSION: 1. No substantial change in mediastinal lymphadenopathy with 10 mm short axis right hilar lymph node visible on today's exam (hypermetabolic right hilar disease was described on the previous PET-CT). 2. Index nodules in the right lung measure stable to slightly smaller in the interval. The 9 mm left lung ground-glass nodule is unchanged. 3. Stable  appearance of nodular enlargement of the right thyroid gland. 4. Atherosclerosis.   Electronically Signed  By: Misty Stanley M.D.  On: 07/12/2015 14:22   .Nm Pet Image Initial (pi) Skull Base To Thigh  04/07/2015  CLINICAL DATA:  Initial treatment strategy for lung nodules. EXAM: NUCLEAR MEDICINE PET SKULL BASE TO THIGH TECHNIQUE: 7.7 mCi F-18 FDG was injected intravenously. Full-ring PET imaging was performed from the skull base to thigh after the radiotracer. CT data was obtained and used for attenuation correction and anatomic localization. FASTING BLOOD GLUCOSE:  Value: 112 mg/dl COMPARISON:  03/24/2015 ; 07/07/2011 FINDINGS: NECK Abnormal hypodense lesion inferiorly in the right temporal lobe with associated hypometabolism. This lesion is only partially included. Visualized hypodense portion measures 4.2 by 3.1 cm on image 1 of series 4. Symmetric physiologic salivary and glottic activity. Right thyroid nodules are similar to prior exams and not hypermetabolic. CHEST Hypermetabolic paratracheal, bilateral hilar, and AP window lymph nodes are present along with a hypermetabolic right infrahilar lymph node. An index paratracheal node measures 7 mm on thickness on image 55 series 4 with maximum standard uptake value 8.3. This node was previously not appreciably hypermetabolic. A right hilar node is difficult to measure in size but has a maximum standard uptake value of 16.3 (formerly 11.5 Bilateral pulmonary nodules are present. A right middle lobe nodule measuring 0.9 by 1.2 cm on image 40 of series 6 (previously the same size on 10/11/2011) has a maximum standard uptake value of 6.4 (previously 5.2 back on 07/07/2011). A previous left upper lobe nodule from 10/11/2011 which was hypermetabolic has resolved in the antrum. Other hypermetabolic nodules are present. The prior hypermetabolic left rib lesion is no longer hypermetabolic and has healed. A 1.6 by 1.4 cm right upper lobe nodule with solid  and sub solid components has maximum standard uptake value 4.2, measured on image 22 series 6. This nodule was not present in 2013. Dense mitral calcification. Coronary, aortic arch, and branch vessel atherosclerotic vascular disease. ABDOMEN/PELVIS Dense calcifications in the right lower quadrant are extraluminal and probably vascular. No associated hypermetabolic activity. Aortoiliac atherosclerotic vascular disease. SKELETON No focal hypermetabolic activity to suggest skeletal metastasis. IMPRESSION: 1. Generally similar appearance of hypermetabolic thoracic adenopathy and scattered hypermetabolic pulmonary nodules, compared to the 2013 prior PET-CT. There is new right upper lobe hypermetabolic nodule since that time, but also interval resolution of a right middle lobe hypermetabolic nodule since that time. Looking back, some of these nodules have been present since 2007, along with some of the thoracic adenopathy. Suspicion is those raised for sarcoid or similar granulomatous process. It is difficult to exclude a new synchronous right upper lobe lung cancer, but the hypermetabolic mixed density nodule could easily be due to presumed underlying granulomatous process. Biopsy or observation suggested. 2. There is a new 4.2 by 3.1 cm hypodense and hypometabolic lesion in the right temporal lobe. If the patient has had a stroke over the past 3 years  than that might explain this. Otherwise, this could represent a cystic mass or other lesion, and I would recommend MRI brain with and without contrast. 3. Multinodular goiter primarily involving the right lobe. 4. Coronary, aortic arch, and branch vessel atherosclerotic vascular disease. Dense mitral valve calcification. Electronically Signed   By: Van Clines M.D.   On: 04/07/2015 15:23    Ct Chest Wo Contrast  03/24/2015  CLINICAL DATA:  Followup lung nodules EXAM: CT CHEST WITHOUT CONTRAST TECHNIQUE: Multidetector CT imaging of the chest was performed  following the standard protocol without IV contrast. COMPARISON:  02/12/2014 FINDINGS: Mediastinum: The heart size appears within normal limits. No pericardial effusion identified. Calcified atherosclerotic disease involves the thoracic aorta as well as the RCA, LAD and left circumflex coronary artery. 9 mm right paratracheal lymph node is stable. There is a calcified sub- carinal lymph node noted. Lungs/Pleura: The right upper lobe part solid nodule measures 1.5 cm and has a central solid component which measures 6 mm. On the previous exam this nodule measured 1.2 cm. Solid right upper lobe lung nodule is stable measuring 3 mm, image 26/series 4. Small ground-glass attenuating nodule within the right upper lobe measures 5 mm, image 30 of series 4. Previously 3 mm. Solid right middle lobe lung nodule measures 11 mm and is unchanged from previous exam, image 34 of series 4. Stable left upper lobe ground-glass attenuating nodule measuring 7 mm, image 31 of series 4. Stable solid nodule within the left upper lobe measuring 4 mm. Upper Abdomen: There is no focal liver abnormality identified. The adrenal glands are within normal limits. Normal appearance of the spleen. The visualized portions of the pancreas and left kidney are normal. Musculoskeletal: Spondylosis noted within the thoracic spine. No aggressive lytic or sclerotic bone lesions. IMPRESSION: 1. Multiple solid and part solid nodules are again identified within both lungs. Within the right upper lobe there is a part solid nodule which has increased in size from previous exam. This now measures 1.5 cm and has a central solid component of 6 mm. 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 Biopsy or surgical resection of this nodule is recommended. 2. The other small nodules are unchanged. 3. Aortic atherosclerosis as well as 3 vessel coronary artery calcification. Electronically Signed   By: Kerby Moors M.D.   On: 03/24/2015 15:53   Ct Chest  Wo Contrast  02/12/2014   CLINICAL DATA:  Followup right upper lobe pulmonary nodule. Other nodules have been stable for more than 2 years, compatible with a benign etiology.  EXAM: CT CHEST WITHOUT CONTRAST  TECHNIQUE: Multidetector CT imaging of the chest was performed following the standard protocol without IV contrast.  COMPARISON:  08/18/2013  FINDINGS: Biapical pleural thickening noted. Tracheal diverticulum in the 7 o'clock position image 12 is reidentified. Areas of subpleural scarring are stable. 5 mm right upper lobe pulmonary nodule image 18 is stable. Other pulmonary nodules identified elsewhere are stable. Intrapulmonary lymph node abutting the minor fissure image 34 is stable. Left lower lobe calcified granuloma image 38 is stable.  Multilevel disc degenerative change noted. No compression deformity.  Thyroid nodularity is stable. Stable small pretracheal nodes. Assessment for central lymphadenopathy is suboptimal due to lack of contrast. Stable 1 cm dominant pretracheal node image 26. Moderate atheromatous aortic calcification identified. Heart size is normal. No pleural effusion or pericardial effusion. Aortic ectasia at the level of the distal arch is stable measuring 3.5 cm image 21.  IMPRESSION: Stable right upper lobe  pulmonary nodule for which followup was previously recommended due to nonvisualization on previous studies. Other previously evaluated nodules elsewhere are stable and do not require specific follow-up. Followup chest CT is recommended March 2016.   Electronically Signed   By: Conchita Paris M.D.   On: 02/12/2014 14:21  Ct Chest Wo Contrast  08/18/2013   CLINICAL DATA:  Followup of pulmonary nodules.  Ex-smoker.  EXAM: CT CHEST WITHOUT CONTRAST  TECHNIQUE: Multidetector CT imaging of the chest was performed following the standard protocol without IV contrast.  COMPARISON:  CT CHEST LOW DOSE PILOT W/O CM dated 08/15/2012; CT CHEST W/CM dated 06/29/2011; CT CHEST LOW DOSE PILOT W/O  CM dated 02/15/2012  FINDINGS: Lungs/Pleura: Probable tracheal diverticulum at the 7 o'clock position on image 12, unchanged.  Mild centrilobular emphysema. Suspicion of either a soft tissue or a ground-glass opacity nodule developing around a branching pulmonary artery. On the order of 5 mm on image 18/series 4 and 6 mm on image 57 coronal. This is not readily apparent comparing back to 1/30 /13.  Favor scarring in the subpleural medial right apex on image 14, unchanged.  A 3 mm right upper lobe lung nodule on image 26/series 4 is similar back to 06/29/2011, consistent with a benign etiology.  Right middle lobe subpleural nodule measures 1.0 x 0.9 cm on image 36 and is similar back to 2013, consistent with a benign etiology.  Stable 3 mm lingular nodule on image 34/series 4. This was present back to the 2013 exam.  No pleural fluid.  Heart/Mediastinum: Low-density right-sided thyroid nodules which are incompletely characterized but were grossly similar back to 2013. No mediastinal or definite hilar adenopathy, given limitations of unenhanced CT. Aortic and branch vessel atherosclerosis. Normal heart size with multivessel coronary artery atherosclerosis. No pericardial effusion.  Upper Abdomen:  Normal adrenal glands.  Bones/Musculoskeletal: Sclerosis involving the 6 posterior lateral left rib, likely posttraumatic.  IMPRESSION: 1. Similar appearance of right middle lobe, right upper lobe, and lingular nodules since 2013, consistent with a benign etiology. 2. 5-6 mm right upper lobe lung nodule. This is likely new when compared to remote prior exams. Given the smoking history, follow-up chest CT at 6-12 months is recommended. This recommendation follows the consensus statement: "Guidelines for Management of Small Pulmonary Nodules Detected on CT Scans: A Statement from the [employer]" as published in Radiology2005;237:395-400. Online at: https://www.arnold.com/. 3.   Atherosclerosis, including within the coronary arteries.   Electronically Signed   By: Margaret Miyamoto M.D.   On: 08/18/2013 16:54     Ct Chest Low Dose Pilot W/o Cm  08/15/2012  *RADIOLOGY REPORT*  Clinical Data: History of smoking.  Pulmonary nodules noted on prior chest CT 02/15/2012.  Follow-up examination.  CT CHEST LOW DOSE PILOT WITHOUT CONTRAST  Technique: Multidetector CT imaging of the chest using the standard low-dose protocol without administration of intravenous contrast.  Comparison: Chest CT 02/15/2012.  Findings:  Mediastinum: Heart size is normal. Small amount of pericardial fluid and/or thickening is unlikely to be of any hemodynamic significance of this time.  No associated pericardial calcification. There is atherosclerosis of the thoracic aorta, the great vessels of the mediastinum and the coronary arteries, including calcified atherosclerotic plaque in the left main, left anterior descending, left circumflex and right coronary arteries. Calcifications of the mitral annulus. No pathologically enlarged mediastinal or hilar lymph nodes. Please note that accurate exclusion of hilar adenopathy is limited on noncontrast CT scans. Esophagus is unremarkable in appearance.  Lungs/Pleura: Previously described  4 mm nodule in the left upper lobe is unchanged (image 122 of series 3) measuring only 4 mm on today's examination.  Previously noted right middle lobe nodule abutting the minor fissure is unchanged measuring 10 x 9 mm (image 138 of series 3).  No other definite new or enlarging suspicious appearing pulmonary nodules or masses are otherwise noted. Bilateral apical nodular areas of pleuroparenchymal thickening are unchanged compared to prior examinations and most compatible with areas of chronic scarring.  No acute consolidative airspace disease.  No pleural effusions.  There is a background of mild diffuse bronchial wall thickening with mild centrilobular and paraseptal emphysema.  Upper Abdomen:  Unremarkable.  Musculoskeletal: There are no aggressive appearing lytic or blastic lesions noted in the visualized portions of the skeleton.  IMPRESSION: 1.  No change in previously noted 10 x 9 mm right middle lobe nodule or 4 mm nodule in the lingula when compared to remote prior examination from 06/29/2011.  Based on the behavior of these nodules, these are favored to be benign, however, continued attention on follow-up studies is recommended. At this time, a 1 year follow-up chest CT in March 2015 is recommended. 2.  Atherosclerosis, including left main and three-vessel coronary artery disease.  Assessment for potential risk factor modification, dietary therapy or pharmacologic therapy may be warranted, if clinically indicated.  3.  Mild diffuse bronchial wall thickening with mild centrilobular paraseptal emphysema.   Original Report Authenticated By: Vinnie Langton, M.D.     Mr Jeri Cos Wo Contrast  04/29/2015  CLINICAL DATA:  Right temporal lobe lesion on recent PET-CT. EXAM: MRI HEAD WITHOUT AND WITH CONTRAST TECHNIQUE: Multiplanar, multiecho pulse sequences of the brain and surrounding structures were obtained without and with intravenous contrast. CONTRAST:  44m MULTIHANCE GADOBENATE DIMEGLUMINE 529 MG/ML IV SOLN COMPARISON:  PET-CT 04/07/2015.  Brain MRI 07/21/2011. FINDINGS: There is no evidence of acute infarct, intracranial hemorrhage, or extra-axial fluid collection. Mild-to-moderate global cerebral atrophy is again seen. Patchy to confluent T2 hyperintensities are again seen in the periventricular greater than subcortical cerebral white matter bilaterally, compatible with moderate chronic small vessel ischemic disease. As seen on recent PET-CT, there is an intra-axial mass in the right temporal lobe which is new from the prior MRI. The mass measures 5.6 x 4.1 x 3.9 cm and is largely cystic with thin peripheral enhancement. However, there are 2 enhancing mural soft tissue nodules. The largest  measures 1.9 x 1.0 cm along the anterosuperior aspect of the lesion, while the smaller measures 8 mm along the inferolateral aspect. There is very mild surrounding edema which slightly extends into the internal and external capsules. Minimal leftward midline shift measures 2-3 mm. No other enhancing brain lesions are seen. Prior bilateral cataract extraction is noted. The paranasal sinuses are clear. There is a trace right mastoid effusion which is chronic. Major intracranial vascular flow voids are preserved. IMPRESSION: 1. 5.6 cm cystic mass with mural nodules in the right temporal lobe with very mild surrounding edema. Primary considerations include low-grade primary CNS neoplasm and cystic metastasis, with the former favored. 2. Moderate chronic small vessel ischemic disease. Electronically Signed   By: ALogan BoresM.D.   On: 04/29/2015 16:40    Recent Lab Findings: Lab Results  Component Value Date   WBC 10.5* 08/26/2015   HGB 13.1 08/26/2015   HCT 40.2 08/26/2015   PLT 126* 08/26/2015   GLUCOSE 114 08/11/2015   CHOL 195 10/01/2014   TRIG 63.0 10/01/2014   HDL 74.70 10/01/2014  LDLCALC 108* 10/01/2014   ALT 59* 08/11/2015   AST 25 08/11/2015   NA 144 08/11/2015   K 3.3* 08/11/2015   CL 104 06/07/2015   CREATININE 0.9 08/11/2015   BUN 17.7 08/11/2015   CO2 31* 08/11/2015   INR 0.98 08/01/2011      Assessment / Plan:    Stable after resection of brain met, Atherosclerosis, including left main and three-vessel coronary artery disease, incidental noted on CT.  Known CAD with previous stent RCA, Followed by Dr Radford Pax   Will see back as needed or requested by Dr Leotis Shames MD  Beeper [phone] Office [phone] 09/03/2015 6:11 PM

## 2015-09-12 ENCOUNTER — Other Ambulatory Visit: Payer: Self-pay | Admitting: Oncology

## 2015-09-14 ENCOUNTER — Ambulatory Visit (HOSPITAL_BASED_OUTPATIENT_CLINIC_OR_DEPARTMENT_OTHER): Payer: Medicare Other

## 2015-09-14 ENCOUNTER — Other Ambulatory Visit (HOSPITAL_BASED_OUTPATIENT_CLINIC_OR_DEPARTMENT_OTHER): Payer: Medicare Other

## 2015-09-14 VITALS — BP 129/61 | HR 52 | Temp 98.3°F | Resp 20

## 2015-09-14 DIAGNOSIS — C3491 Malignant neoplasm of unspecified part of right bronchus or lung: Secondary | ICD-10-CM | POA: Diagnosis not present

## 2015-09-14 DIAGNOSIS — Z5111 Encounter for antineoplastic chemotherapy: Secondary | ICD-10-CM

## 2015-09-14 LAB — CBC WITH DIFFERENTIAL/PLATELET
BASO%: 1.3 % (ref 0.0–2.0)
Basophils Absolute: 0.1 10*3/uL (ref 0.0–0.1)
EOS ABS: 0.2 10*3/uL (ref 0.0–0.5)
EOS%: 3.9 % (ref 0.0–7.0)
HCT: 41.4 % (ref 34.8–46.6)
HEMOGLOBIN: 14 g/dL (ref 11.6–15.9)
LYMPH%: 18.4 % (ref 14.0–49.7)
MCH: 30.6 pg (ref 25.1–34.0)
MCHC: 33.7 g/dL (ref 31.5–36.0)
MCV: 90.8 fL (ref 79.5–101.0)
MONO#: 0.7 10*3/uL (ref 0.1–0.9)
MONO%: 14.9 % — AB (ref 0.0–14.0)
NEUT#: 3 10*3/uL (ref 1.5–6.5)
NEUT%: 61.5 % (ref 38.4–76.8)
Platelets: 221 10*3/uL (ref 145–400)
RBC: 4.57 10*6/uL (ref 3.70–5.45)
RDW: 15.2 % — AB (ref 11.2–14.5)
WBC: 4.9 10*3/uL (ref 3.9–10.3)
lymph#: 0.9 10*3/uL (ref 0.9–3.3)

## 2015-09-14 LAB — COMPREHENSIVE METABOLIC PANEL
ALBUMIN: 3.6 g/dL (ref 3.5–5.0)
ALT: 19 U/L (ref 0–55)
AST: 17 U/L (ref 5–34)
Alkaline Phosphatase: 97 U/L (ref 40–150)
Anion Gap: 8 mEq/L (ref 3–11)
BUN: 25.3 mg/dL (ref 7.0–26.0)
CHLORIDE: 106 meq/L (ref 98–109)
CO2: 28 mEq/L (ref 22–29)
Calcium: 9.1 mg/dL (ref 8.4–10.4)
Creatinine: 0.8 mg/dL (ref 0.6–1.1)
EGFR: 69 mL/min/{1.73_m2} — ABNORMAL LOW (ref >90)
GLUCOSE: 97 mg/dL (ref 70–140)
POTASSIUM: 3.7 meq/L (ref 3.5–5.1)
SODIUM: 142 meq/L (ref 136–145)
Total Bilirubin: 0.55 mg/dL (ref 0.20–1.20)
Total Protein: 6.6 g/dL (ref 6.4–8.3)

## 2015-09-14 MED ORDER — SODIUM CHLORIDE 0.9 % IV SOLN
10.0000 mg | Freq: Once | INTRAVENOUS | Status: AC
  Administered 2015-09-14: 10 mg via INTRAVENOUS
  Filled 2015-09-14: qty 1

## 2015-09-14 MED ORDER — SODIUM CHLORIDE 0.9 % IV SOLN
Freq: Once | INTRAVENOUS | Status: AC
  Administered 2015-09-14: 11:00:00 via INTRAVENOUS

## 2015-09-14 MED ORDER — SODIUM CHLORIDE 0.9 % IV SOLN
410.0000 mg | Freq: Once | INTRAVENOUS | Status: AC
  Administered 2015-09-14: 410 mg via INTRAVENOUS
  Filled 2015-09-14: qty 41

## 2015-09-14 MED ORDER — PALONOSETRON HCL INJECTION 0.25 MG/5ML
0.2500 mg | Freq: Once | INTRAVENOUS | Status: AC
  Administered 2015-09-14: 0.25 mg via INTRAVENOUS

## 2015-09-14 MED ORDER — SODIUM CHLORIDE 0.9 % IV SOLN
80.0000 mg/m2 | Freq: Once | INTRAVENOUS | Status: AC
  Administered 2015-09-14: 150 mg via INTRAVENOUS
  Filled 2015-09-14: qty 7.5

## 2015-09-14 MED ORDER — PALONOSETRON HCL INJECTION 0.25 MG/5ML
INTRAVENOUS | Status: AC
  Filled 2015-09-14: qty 5

## 2015-09-14 NOTE — Patient Instructions (Signed)
[facility] Discharge Instructions for Patients Receiving Chemotherapy  Today you received the following chemotherapy agents:  Etoposide and carboplatin.  To help prevent nausea and vomiting after your treatment, we encourage you to take your nausea medication as prescribed.   If you develop nausea and vomiting that is not controlled by your nausea medication, call the clinic.   BELOW ARE SYMPTOMS THAT SHOULD BE REPORTED IMMEDIATELY:  *FEVER GREATER THAN 100.5 F  *CHILLS WITH OR WITHOUT FEVER  NAUSEA AND VOMITING THAT IS NOT CONTROLLED WITH YOUR NAUSEA MEDICATION  *UNUSUAL SHORTNESS OF BREATH  *UNUSUAL BRUISING OR BLEEDING  TENDERNESS IN MOUTH AND THROAT WITH OR WITHOUT PRESENCE OF ULCERS  *URINARY PROBLEMS  *BOWEL PROBLEMS  UNUSUAL RASH Items with * indicate a potential emergency and should be followed up as soon as possible.  Feel free to call the clinic you have any questions or concerns. The clinic phone number is [phone].  Please show the [place] at check-in to the Emergency Department and triage nurse.

## 2015-09-15 ENCOUNTER — Ambulatory Visit (HOSPITAL_BASED_OUTPATIENT_CLINIC_OR_DEPARTMENT_OTHER): Payer: Medicare Other

## 2015-09-15 VITALS — BP 133/65 | HR 57 | Temp 97.5°F | Resp 18

## 2015-09-15 DIAGNOSIS — C3491 Malignant neoplasm of unspecified part of right bronchus or lung: Secondary | ICD-10-CM

## 2015-09-15 DIAGNOSIS — Z5111 Encounter for antineoplastic chemotherapy: Secondary | ICD-10-CM | POA: Diagnosis not present

## 2015-09-15 MED ORDER — SODIUM CHLORIDE 0.9% FLUSH
10.0000 mL | INTRAVENOUS | Status: DC | PRN
  Filled 2015-09-15: qty 10

## 2015-09-15 MED ORDER — SODIUM CHLORIDE 0.9 % IV SOLN
Freq: Once | INTRAVENOUS | Status: AC
  Administered 2015-09-15: 11:00:00 via INTRAVENOUS

## 2015-09-15 MED ORDER — SODIUM CHLORIDE 0.9 % IV SOLN
80.0000 mg/m2 | Freq: Once | INTRAVENOUS | Status: AC
  Administered 2015-09-15: 150 mg via INTRAVENOUS
  Filled 2015-09-15: qty 7.5

## 2015-09-15 MED ORDER — SODIUM CHLORIDE 0.9 % IV SOLN
10.0000 mg | Freq: Once | INTRAVENOUS | Status: AC
  Administered 2015-09-15: 10 mg via INTRAVENOUS
  Filled 2015-09-15: qty 1

## 2015-09-15 NOTE — Patient Instructions (Signed)
[facility] Discharge Instructions for Patients Receiving Chemotherapy  Today you received the following chemotherapy agents Etoposide.   To help prevent nausea and vomiting after your treatment, we encourage you to take your nausea medication as prescribed.   If you develop nausea and vomiting that is not controlled by your nausea medication, call the clinic.   BELOW ARE SYMPTOMS THAT SHOULD BE REPORTED IMMEDIATELY:  *FEVER GREATER THAN 100.5 F  *CHILLS WITH OR WITHOUT FEVER  NAUSEA AND VOMITING THAT IS NOT CONTROLLED WITH YOUR NAUSEA MEDICATION  *UNUSUAL SHORTNESS OF BREATH  *UNUSUAL BRUISING OR BLEEDING  TENDERNESS IN MOUTH AND THROAT WITH OR WITHOUT PRESENCE OF ULCERS  *URINARY PROBLEMS  *BOWEL PROBLEMS  UNUSUAL RASH Items with * indicate a potential emergency and should be followed up as soon as possible.  Feel free to call the clinic you have any questions or concerns. The clinic phone number is [phone].  Please show the CHEMO ALERT CARD at check-in to the Emergency Department and triage nurse.   

## 2015-09-16 ENCOUNTER — Other Ambulatory Visit: Payer: Self-pay

## 2015-09-16 ENCOUNTER — Ambulatory Visit (HOSPITAL_BASED_OUTPATIENT_CLINIC_OR_DEPARTMENT_OTHER): Payer: Medicare Other

## 2015-09-16 VITALS — BP 170/66 | HR 98 | Temp 98.2°F | Resp 18

## 2015-09-16 DIAGNOSIS — C3491 Malignant neoplasm of unspecified part of right bronchus or lung: Secondary | ICD-10-CM | POA: Diagnosis not present

## 2015-09-16 DIAGNOSIS — Z95828 Presence of other vascular implants and grafts: Secondary | ICD-10-CM | POA: Insufficient documentation

## 2015-09-16 DIAGNOSIS — Z5111 Encounter for antineoplastic chemotherapy: Secondary | ICD-10-CM

## 2015-09-16 MED ORDER — SODIUM CHLORIDE 0.9 % IV SOLN
80.0000 mg/m2 | Freq: Once | INTRAVENOUS | Status: AC
  Administered 2015-09-16: 150 mg via INTRAVENOUS
  Filled 2015-09-16: qty 7.5

## 2015-09-16 MED ORDER — DEXAMETHASONE SODIUM PHOSPHATE 100 MG/10ML IJ SOLN
10.0000 mg | Freq: Once | INTRAMUSCULAR | Status: AC
  Administered 2015-09-16: 10 mg via INTRAVENOUS
  Filled 2015-09-16: qty 1

## 2015-09-16 MED ORDER — SODIUM CHLORIDE 0.9 % IV SOLN
Freq: Once | INTRAVENOUS | Status: AC
  Administered 2015-09-16: 11:00:00 via INTRAVENOUS

## 2015-09-16 NOTE — Patient Instructions (Signed)
[facility] for Patients Receiving Chemotherapy  Today you received the following chemotherapy agents Etoposide.   To help prevent nausea and vomiting after your treatment, we encourage you to take your nausea medication as prescribed.   If you develop nausea and vomiting that is not controlled by your nausea medication, call the clinic.   BELOW ARE SYMPTOMS THAT SHOULD BE REPORTED IMMEDIATELY:  *FEVER GREATER THAN 100.5 F  *CHILLS WITH OR WITHOUT FEVER  NAUSEA AND VOMITING THAT IS NOT CONTROLLED WITH YOUR NAUSEA MEDICATION  *UNUSUAL SHORTNESS OF BREATH  *UNUSUAL BRUISING OR BLEEDING  TENDERNESS IN MOUTH AND THROAT WITH OR WITHOUT PRESENCE OF ULCERS  *URINARY PROBLEMS  *BOWEL PROBLEMS  UNUSUAL RASH Items with * indicate a potential emergency and should be followed up as soon as possible.  Feel free to call the clinic you have any questions or concerns. The clinic phone number is [phone].  Please show the CHEMO ALERT CARD at check-in to the Emergency Department and triage nurse.   

## 2015-09-17 ENCOUNTER — Ambulatory Visit (HOSPITAL_BASED_OUTPATIENT_CLINIC_OR_DEPARTMENT_OTHER): Payer: Medicare Other

## 2015-09-17 VITALS — BP 150/78 | HR 55 | Temp 98.2°F | Resp 20

## 2015-09-17 DIAGNOSIS — C3491 Malignant neoplasm of unspecified part of right bronchus or lung: Secondary | ICD-10-CM

## 2015-09-17 DIAGNOSIS — Z5189 Encounter for other specified aftercare: Secondary | ICD-10-CM

## 2015-09-17 MED ORDER — PEGFILGRASTIM INJECTION 6 MG/0.6ML ~~LOC~~
6.0000 mg | PREFILLED_SYRINGE | Freq: Once | SUBCUTANEOUS | Status: AC
  Administered 2015-09-17: 6 mg via SUBCUTANEOUS
  Filled 2015-09-17: qty 0.6

## 2015-09-17 NOTE — Patient Instructions (Signed)
Pegfilgrastim injection What is this medicine? PEGFILGRASTIM (PEG fil gra stim) is a long-acting granulocyte colony-stimulating factor that stimulates the growth of neutrophils, a type of white blood cell important in the body's fight against infection. It is used to reduce the incidence of fever and infection in patients with certain types of cancer who are receiving chemotherapy that affects the bone marrow, and to increase survival after being exposed to high doses of radiation. This medicine may be used for other purposes; ask your health care provider or pharmacist if you have questions. What should I tell my health care provider before I take this medicine? They need to know if you have any of these conditions: -kidney disease -latex allergy -ongoing radiation therapy -sickle cell disease -skin reactions to acrylic adhesives (On-Body Injector only) -an unusual or allergic reaction to pegfilgrastim, filgrastim, other medicines, foods, dyes, or preservatives -pregnant or trying to get pregnant -breast-feeding How should I use this medicine? This medicine is for injection under the skin. If you get this medicine at home, you will be taught how to prepare and give the pre-filled syringe or how to use the On-body Injector. Refer to the patient Instructions for Use for detailed instructions. Use exactly as directed. Take your medicine at regular intervals. Do not take your medicine more often than directed. It is important that you put your used needles and syringes in a special sharps container. Do not put them in a trash can. If you do not have a sharps container, call your pharmacist or healthcare provider to get one. Talk to your pediatrician regarding the use of this medicine in children. While this drug may be prescribed for selected conditions, precautions do apply. Overdosage: If you think you have taken too much of this medicine contact a poison control center or emergency room at  once. NOTE: This medicine is only for you. Do not share this medicine with others. What if I miss a dose? It is important not to miss your dose. Call your doctor or health care professional if you miss your dose. If you miss a dose due to an On-body Injector failure or leakage, a new dose should be administered as soon as possible using a single prefilled syringe for manual use. What may interact with this medicine? Interactions have not been studied. Give your health care provider a list of all the medicines, herbs, non-prescription drugs, or dietary supplements you use. Also tell them if you smoke, drink alcohol, or use illegal drugs. Some items may interact with your medicine. This list may not describe all possible interactions. Give your health care provider a list of all the medicines, herbs, non-prescription drugs, or dietary supplements you use. Also tell them if you smoke, drink alcohol, or use illegal drugs. Some items may interact with your medicine. What should I watch for while using this medicine? You may need blood work done while you are taking this medicine. If you are going to need a MRI, CT scan, or other procedure, tell your doctor that you are using this medicine (On-Body Injector only). What side effects may I notice from receiving this medicine? Side effects that you should report to your doctor or health care professional as soon as possible: -allergic reactions like skin rash, itching or hives, swelling of the face, lips, or tongue -dizziness -fever -pain, redness, or irritation at site where injected -pinpoint red spots on the skin -red or dark-brown urine -shortness of breath or breathing problems -stomach or side pain, or pain   at the shoulder -swelling -tiredness -trouble passing urine or change in the amount of urine Side effects that usually do not require medical attention (report to your doctor or health care professional if they continue or are  bothersome): -bone pain -muscle pain This list may not describe all possible side effects. Call your doctor for medical advice about side effects. You may report side effects to FDA at 1-800-FDA-1088. Where should I keep my medicine? Keep out of the reach of children. Store pre-filled syringes in a refrigerator between 2 and 8 degrees C (36 and 46 degrees F). Do not freeze. Keep in carton to protect from light. Throw away this medicine if it is left out of the refrigerator for more than 48 hours. Throw away any unused medicine after the expiration date. NOTE: This sheet is a summary. It may not cover all possible information. If you have questions about this medicine, talk to your doctor, pharmacist, or health care provider.    2016, Elsevier/Gold Standard. (2014-06-04 14:30:14)  

## 2015-09-24 ENCOUNTER — Other Ambulatory Visit (HOSPITAL_BASED_OUTPATIENT_CLINIC_OR_DEPARTMENT_OTHER): Payer: Medicare Other

## 2015-09-24 ENCOUNTER — Telehealth: Payer: Self-pay | Admitting: Oncology

## 2015-09-24 ENCOUNTER — Ambulatory Visit (HOSPITAL_BASED_OUTPATIENT_CLINIC_OR_DEPARTMENT_OTHER): Payer: Medicare Other | Admitting: Oncology

## 2015-09-24 VITALS — BP 152/57 | HR 54 | Temp 97.8°F | Resp 18 | Ht 64.0 in | Wt 182.6 lb

## 2015-09-24 DIAGNOSIS — C7931 Secondary malignant neoplasm of brain: Secondary | ICD-10-CM | POA: Diagnosis not present

## 2015-09-24 DIAGNOSIS — C3491 Malignant neoplasm of unspecified part of right bronchus or lung: Secondary | ICD-10-CM

## 2015-09-24 DIAGNOSIS — I10 Essential (primary) hypertension: Secondary | ICD-10-CM

## 2015-09-24 DIAGNOSIS — M545 Low back pain: Secondary | ICD-10-CM

## 2015-09-24 DIAGNOSIS — B379 Candidiasis, unspecified: Secondary | ICD-10-CM

## 2015-09-24 LAB — CBC WITH DIFFERENTIAL/PLATELET
BASO%: 0.5 % (ref 0.0–2.0)
Basophils Absolute: 0.1 10*3/uL (ref 0.0–0.1)
EOS%: 1.4 % (ref 0.0–7.0)
Eosinophils Absolute: 0.2 10*3/uL (ref 0.0–0.5)
HCT: 38.9 % (ref 34.8–46.6)
HGB: 12.7 g/dL (ref 11.6–15.9)
LYMPH%: 8.1 % — AB (ref 14.0–49.7)
MCH: 30 pg (ref 25.1–34.0)
MCHC: 32.7 g/dL (ref 31.5–36.0)
MCV: 91.8 fL (ref 79.5–101.0)
MONO#: 1.5 10*3/uL — ABNORMAL HIGH (ref 0.1–0.9)
MONO%: 11.1 % (ref 0.0–14.0)
NEUT%: 78.9 % — ABNORMAL HIGH (ref 38.4–76.8)
NEUTROS ABS: 10.4 10*3/uL — AB (ref 1.5–6.5)
Platelets: 143 10*3/uL — ABNORMAL LOW (ref 145–400)
RBC: 4.24 10*6/uL (ref 3.70–5.45)
RDW: 15.3 % — ABNORMAL HIGH (ref 11.2–14.5)
WBC: 13.2 10*3/uL — AB (ref 3.9–10.3)
lymph#: 1.1 10*3/uL (ref 0.9–3.3)

## 2015-09-24 NOTE — Telephone Encounter (Signed)
GAVE PT APPT & AVS

## 2015-09-24 NOTE — Progress Notes (Signed)
  [facility] OFFICE PROGRESS NOTE   Diagnosis: Extensive stage small cell lung cancer  INTERVAL HISTORY:   Margaret Hale returns as scheduled. She completed a second cycle of chemotherapy beginning 09/14/2015. She developed low back pain for 2-3 days after the Neulasta. She did not pain medication. She complains of weight gain. Her chief complaint today is "sweats "throughout the day and night.   Objective:  Vital signs in last 24 hours:  Blood pressure 152/57, pulse 54, temperature 97.8 F (36.6 C), temperature source Oral, resp. rate 18, height '5\' 4"'$  (1.626 m), weight 182 lb 9.6 oz (82.827 kg), SpO2 98 %.    HEENT: No thrush or ulcers Resp: Coarse rhonchi in the right posterior chest, no respiratory distress Cardio: Regular rate and rhythm GI: No hepatosplenomegaly, nontender Vascular: No leg edema  Skin: Yeast rash beneath the breast bilaterally, diffuse sweating     Lab Results:  Lab Results  Component Value Date   WBC 13.2* 09/24/2015   HGB 12.7 09/24/2015   HCT 38.9 09/24/2015   MCV 91.8 09/24/2015   PLT 143* 09/24/2015   NEUTROABS 10.4* 09/24/2015     Medications: I have reviewed the patient's current medications.  Assessment/Plan: 1. Extensive stage small cell lung cancer  resection of right temporal brain mass 06/09/2015 confirmed metastatic small cell carcinoma   Staging [facility] 07/22/2015 revealed decreased activity associated with previously seen pulmonary nodules , decreased activity of hypermetabolic mediastinal and bilateral hilar nodes , no new sites of FDG avid metastatic disease compared to a [facility] PET scan from November 2016  cycle 1 etoposide/carboplatin 08/16/2015  Cycle 2 etoposide/carboplatin 09/14/2015  2. Chronic history of mediastinal lymphadenopathy and pulmonary nodules- diagnostic workup in 2013 including bronchoscopy/ EBUS , and mediastinoscopy -negative for malignancy, , granulomatous  changes noted  3. Hypertension  4. History of CAD  5.     "Sweats "-most likely related to post menopausal hot flashes  Disposition: Ms.Wimmer appears to be tolerating the chemotherapy well. I suspect the "sweats "are related to hot flashes. She reports experiencing this in the past prior to being placed on estrogen replacement. She is no longer taking estrogen.  She will try powder on the yeast rash. She would like to delay the next cycle of chemotherapy until 10/19/2015. She will return for an office visit that day.      Betsy Coder, MD  09/24/2015  10:13 AM

## 2015-10-19 ENCOUNTER — Other Ambulatory Visit: Payer: Self-pay | Admitting: *Deleted

## 2015-10-19 ENCOUNTER — Ambulatory Visit (HOSPITAL_BASED_OUTPATIENT_CLINIC_OR_DEPARTMENT_OTHER): Payer: Medicare Other

## 2015-10-19 ENCOUNTER — Telehealth: Payer: Self-pay | Admitting: *Deleted

## 2015-10-19 ENCOUNTER — Ambulatory Visit (HOSPITAL_BASED_OUTPATIENT_CLINIC_OR_DEPARTMENT_OTHER): Payer: Medicare Other | Admitting: Oncology

## 2015-10-19 ENCOUNTER — Other Ambulatory Visit (HOSPITAL_BASED_OUTPATIENT_CLINIC_OR_DEPARTMENT_OTHER): Payer: Medicare Other

## 2015-10-19 ENCOUNTER — Telehealth: Payer: Self-pay | Admitting: Oncology

## 2015-10-19 VITALS — BP 139/60 | HR 50 | Temp 97.5°F | Resp 18 | Ht 64.0 in | Wt 184.4 lb

## 2015-10-19 DIAGNOSIS — C7931 Secondary malignant neoplasm of brain: Secondary | ICD-10-CM | POA: Diagnosis not present

## 2015-10-19 DIAGNOSIS — Z5111 Encounter for antineoplastic chemotherapy: Secondary | ICD-10-CM | POA: Diagnosis not present

## 2015-10-19 DIAGNOSIS — C3491 Malignant neoplasm of unspecified part of right bronchus or lung: Secondary | ICD-10-CM | POA: Diagnosis not present

## 2015-10-19 DIAGNOSIS — I10 Essential (primary) hypertension: Secondary | ICD-10-CM | POA: Diagnosis not present

## 2015-10-19 LAB — COMPREHENSIVE METABOLIC PANEL
ALT: 17 U/L (ref 0–55)
ANION GAP: 10 meq/L (ref 3–11)
AST: 16 U/L (ref 5–34)
Albumin: 3.7 g/dL (ref 3.5–5.0)
Alkaline Phosphatase: 90 U/L (ref 40–150)
BILIRUBIN TOTAL: 0.42 mg/dL (ref 0.20–1.20)
BUN: 27.2 mg/dL — ABNORMAL HIGH (ref 7.0–26.0)
CHLORIDE: 106 meq/L (ref 98–109)
CO2: 26 meq/L (ref 22–29)
Calcium: 9.2 mg/dL (ref 8.4–10.4)
Creatinine: 0.8 mg/dL (ref 0.6–1.1)
EGFR: 68 mL/min/{1.73_m2} — AB (ref >90)
Glucose: 105 mg/dl (ref 70–140)
Potassium: 3.9 mEq/L (ref 3.5–5.1)
Sodium: 142 mEq/L (ref 136–145)
Total Protein: 6.7 g/dL (ref 6.4–8.3)

## 2015-10-19 LAB — CBC WITH DIFFERENTIAL/PLATELET
BASO%: 1.6 % (ref 0.0–2.0)
BASOS ABS: 0.1 10*3/uL (ref 0.0–0.1)
EOS ABS: 0.2 10*3/uL (ref 0.0–0.5)
EOS%: 5 % (ref 0.0–7.0)
HCT: 40.3 % (ref 34.8–46.6)
HEMOGLOBIN: 13.5 g/dL (ref 11.6–15.9)
LYMPH%: 22.5 % (ref 14.0–49.7)
MCH: 30.3 pg (ref 25.1–34.0)
MCHC: 33.4 g/dL (ref 31.5–36.0)
MCV: 90.7 fL (ref 79.5–101.0)
MONO#: 0.5 10*3/uL (ref 0.1–0.9)
MONO%: 14.2 % — AB (ref 0.0–14.0)
NEUT%: 56.7 % (ref 38.4–76.8)
NEUTROS ABS: 2.2 10*3/uL (ref 1.5–6.5)
PLATELETS: 191 10*3/uL (ref 145–400)
RBC: 4.44 10*6/uL (ref 3.70–5.45)
RDW: 15.2 % — AB (ref 11.2–14.5)
WBC: 3.8 10*3/uL — AB (ref 3.9–10.3)
lymph#: 0.9 10*3/uL (ref 0.9–3.3)

## 2015-10-19 MED ORDER — SODIUM CHLORIDE 0.9 % IV SOLN
5.0000 mg | Freq: Once | INTRAVENOUS | Status: AC
  Administered 2015-10-19: 5 mg via INTRAVENOUS
  Filled 2015-10-19: qty 0.5

## 2015-10-19 MED ORDER — SODIUM CHLORIDE 0.9 % IV SOLN
80.0000 mg/m2 | Freq: Once | INTRAVENOUS | Status: AC
  Administered 2015-10-19: 150 mg via INTRAVENOUS
  Filled 2015-10-19: qty 7.5

## 2015-10-19 MED ORDER — SODIUM CHLORIDE 0.9 % IV SOLN
Freq: Once | INTRAVENOUS | Status: AC
  Administered 2015-10-19: 13:00:00 via INTRAVENOUS

## 2015-10-19 MED ORDER — SODIUM CHLORIDE 0.9 % IV SOLN
410.0000 mg | Freq: Once | INTRAVENOUS | Status: AC
  Administered 2015-10-19: 410 mg via INTRAVENOUS
  Filled 2015-10-19: qty 41

## 2015-10-19 MED ORDER — PALONOSETRON HCL INJECTION 0.25 MG/5ML
0.2500 mg | Freq: Once | INTRAVENOUS | Status: AC
  Administered 2015-10-19: 0.25 mg via INTRAVENOUS

## 2015-10-19 MED ORDER — PALONOSETRON HCL INJECTION 0.25 MG/5ML
INTRAVENOUS | Status: AC
  Filled 2015-10-19: qty 5

## 2015-10-19 NOTE — Progress Notes (Signed)
Per Dr. Benay Spice, [place] to leave NSL in place for treatment on 10/20/15 and 10/21/15 if site is WNL with positive blood return.

## 2015-10-19 NOTE — Progress Notes (Signed)
  Margaret Hale OFFICE PROGRESS NOTE   Diagnosis: Small cell lung cancer  INTERVAL HISTORY:   She returns as scheduled. She began another cycle of chemotherapy beginning 09/14/2015. She tolerated chemotherapy therapy well. No nausea/vomiting. Good appetite. She came to have hot flashes. She complains of insomnia following chemotherapy. She would like a Port-A-Cath secondary to difficulty with peripheral IV access.  Objective:  Vital signs in last 24 hours:  Blood pressure 139/60, pulse 50, temperature 97.5 F (36.4 C), temperature source Oral, resp. rate 18, height '5\' 4"'$  (1.626 m), weight 184 lb 6.4 oz (83.643 kg), SpO2 96 %.    HEENT: No thrush or ulcers Lymphatics: No cervical or supra-clavicular nodes Resp: Scattered end inspiratory coarse rhonchi, no respiratory distress Cardio: Regular rate and rhythm GI: No hepatomegaly Vascular: No leg edema   Lab Results:  Lab Results  Component Value Date   WBC 3.8* 10/19/2015   HGB 13.5 10/19/2015   HCT 40.3 10/19/2015   MCV 90.7 10/19/2015   PLT 191 10/19/2015   NEUTROABS 2.2 10/19/2015     Medications: I have reviewed the patient's current medications.  Assessment/Plan: 1. Extensive stage small cell lung cancer  resection of right temporal brain mass 06/09/2015 confirmed metastatic small cell carcinoma  Staging PET scan at [facility] 07/22/2015 revealed decreased activity associated with previously seen pulmonary nodules , decreased activity of hypermetabolic mediastinal and bilateral hilar nodes , no new sites of FDG avid metastatic disease compared to a [facility] PET scan from November 2016  cycle 1 etoposide/carboplatin 08/16/2015  Cycle 2 etoposide/carboplatin 09/14/2015  2. Chronic history of mediastinal lymphadenopathy and pulmonary nodules- diagnostic workup in 2013 including bronchoscopy/ EBUS , and mediastinoscopy -negative for malignancy, , granulomatous changes noted  3.  Hypertension  4. History of CAD  5. "Sweats "-most likely related to post menopausal hot flashes   Disposition:  She appears unchanged. The plan is to proceed with cycle 3 etoposide/carboplatin. She will undergo a restaging chest CT following this cycle. I contacted interventional radiology. We cannot get a Port-A-Cath placed within the next few days. We will arrange for a PICC for IV access.  She will return for an office visit after the restaging chest CT in one month. I will decrease the Decadron premedication in an attempt to help the insomnia.  Betsy Coder, MD  10/19/2015  11:01 AM

## 2015-10-19 NOTE — Patient Instructions (Signed)
[facility] Discharge Instructions for Patients Receiving Chemotherapy  Today you received the following chemotherapy agents Etoposide and Carboplatin. To help prevent nausea and vomiting after your treatment, we encourage you to take your nausea medication as directed - NO ZOFRAN FOR 3 DAYS  If you develop nausea and vomiting that is not controlled by your nausea medication, call the clinic.   BELOW ARE SYMPTOMS THAT SHOULD BE REPORTED IMMEDIATELY:  *FEVER GREATER THAN 100.5 F  *CHILLS WITH OR WITHOUT FEVER  NAUSEA AND VOMITING THAT IS NOT CONTROLLED WITH YOUR NAUSEA MEDICATION  *UNUSUAL SHORTNESS OF BREATH  *UNUSUAL BRUISING OR BLEEDING  TENDERNESS IN MOUTH AND THROAT WITH OR WITHOUT PRESENCE OF ULCERS  *URINARY PROBLEMS  *BOWEL PROBLEMS  UNUSUAL RASH Items with * indicate a potential emergency and should be followed up as soon as possible.  Feel free to call the clinic you have any questions or concerns. The clinic phone number is [phone].  Please show the [place] at check-in to the Emergency Department and triage nurse.

## 2015-10-19 NOTE — Telephone Encounter (Signed)
Pt will get new sched in tx room

## 2015-10-19 NOTE — Telephone Encounter (Signed)
Per staff message and POF I have scheduled appts. Advised scheduler of appts. JMW  

## 2015-10-20 ENCOUNTER — Telehealth: Payer: Self-pay | Admitting: Oncology

## 2015-10-20 ENCOUNTER — Ambulatory Visit (HOSPITAL_BASED_OUTPATIENT_CLINIC_OR_DEPARTMENT_OTHER): Payer: Medicare Other

## 2015-10-20 ENCOUNTER — Other Ambulatory Visit: Payer: Self-pay | Admitting: *Deleted

## 2015-10-20 DIAGNOSIS — Z5111 Encounter for antineoplastic chemotherapy: Secondary | ICD-10-CM | POA: Diagnosis not present

## 2015-10-20 DIAGNOSIS — C3491 Malignant neoplasm of unspecified part of right bronchus or lung: Secondary | ICD-10-CM | POA: Diagnosis not present

## 2015-10-20 MED ORDER — DEXAMETHASONE SODIUM PHOSPHATE 100 MG/10ML IJ SOLN
5.0000 mg | Freq: Once | INTRAMUSCULAR | Status: AC
  Administered 2015-10-20: 5 mg via INTRAVENOUS
  Filled 2015-10-20: qty 0.5

## 2015-10-20 MED ORDER — SODIUM CHLORIDE 0.9 % IV SOLN
Freq: Once | INTRAVENOUS | Status: AC
  Administered 2015-10-20: 13:00:00 via INTRAVENOUS

## 2015-10-20 MED ORDER — ETOPOSIDE CHEMO INJECTION 1 GM/50ML
80.0000 mg/m2 | Freq: Once | INTRAVENOUS | Status: AC
  Administered 2015-10-20: 150 mg via INTRAVENOUS
  Filled 2015-10-20: qty 7.5

## 2015-10-20 NOTE — Progress Notes (Signed)
R forearm angiocath discontinued. Mild edema noted below site. Pt instructed to keep pressure dressing intact x 2 hours. Continue pressure if swollen. Use ice to site only if painful. She voiced understanding.

## 2015-10-20 NOTE — Telephone Encounter (Signed)
Pt came in to r/s appt...gv pt new sched

## 2015-10-21 ENCOUNTER — Ambulatory Visit (HOSPITAL_BASED_OUTPATIENT_CLINIC_OR_DEPARTMENT_OTHER): Payer: Medicare Other

## 2015-10-21 VITALS — Temp 98.4°F

## 2015-10-21 DIAGNOSIS — Z5111 Encounter for antineoplastic chemotherapy: Secondary | ICD-10-CM | POA: Diagnosis not present

## 2015-10-21 DIAGNOSIS — C3491 Malignant neoplasm of unspecified part of right bronchus or lung: Secondary | ICD-10-CM | POA: Diagnosis not present

## 2015-10-21 MED ORDER — SODIUM CHLORIDE 0.9 % IV SOLN
Freq: Once | INTRAVENOUS | Status: AC
  Administered 2015-10-21: 14:00:00 via INTRAVENOUS

## 2015-10-21 MED ORDER — SODIUM CHLORIDE 0.9 % IV SOLN
80.0000 mg/m2 | Freq: Once | INTRAVENOUS | Status: AC
  Administered 2015-10-21: 150 mg via INTRAVENOUS
  Filled 2015-10-21: qty 7.5

## 2015-10-21 MED ORDER — SODIUM CHLORIDE 0.9 % IV SOLN
5.0000 mg | Freq: Once | INTRAVENOUS | Status: AC
  Administered 2015-10-21: 5 mg via INTRAVENOUS
  Filled 2015-10-21: qty 0.5

## 2015-10-21 NOTE — Patient Instructions (Signed)
[facility] Discharge Instructions for Patients Receiving Chemotherapy  Today you received the following chemotherapy agents Etoposide and Carboplatin. To help prevent nausea and vomiting after your treatment, we encourage you to take your nausea medication as directed - NO ZOFRAN FOR 3 DAYS  If you develop nausea and vomiting that is not controlled by your nausea medication, call the clinic.   BELOW ARE SYMPTOMS THAT SHOULD BE REPORTED IMMEDIATELY:  *FEVER GREATER THAN 100.5 F  *CHILLS WITH OR WITHOUT FEVER  NAUSEA AND VOMITING THAT IS NOT CONTROLLED WITH YOUR NAUSEA MEDICATION  *UNUSUAL SHORTNESS OF BREATH  *UNUSUAL BRUISING OR BLEEDING  TENDERNESS IN MOUTH AND THROAT WITH OR WITHOUT PRESENCE OF ULCERS  *URINARY PROBLEMS  *BOWEL PROBLEMS  UNUSUAL RASH Items with * indicate a potential emergency and should be followed up as soon as possible.  Feel free to call the clinic you have any questions or concerns. The clinic phone number is [phone].  Please show the [name] at check-in to the Emergency Department and triage nurse.

## 2015-10-22 ENCOUNTER — Ambulatory Visit (HOSPITAL_BASED_OUTPATIENT_CLINIC_OR_DEPARTMENT_OTHER): Payer: Medicare Other

## 2015-10-22 ENCOUNTER — Other Ambulatory Visit: Payer: Self-pay | Admitting: General Surgery

## 2015-10-22 ENCOUNTER — Other Ambulatory Visit: Payer: Self-pay | Admitting: Radiology

## 2015-10-22 VITALS — BP 163/71 | HR 50 | Temp 97.9°F | Resp 18

## 2015-10-22 DIAGNOSIS — Z5189 Encounter for other specified aftercare: Secondary | ICD-10-CM | POA: Diagnosis not present

## 2015-10-22 DIAGNOSIS — C3491 Malignant neoplasm of unspecified part of right bronchus or lung: Secondary | ICD-10-CM | POA: Diagnosis not present

## 2015-10-22 MED ORDER — PEGFILGRASTIM INJECTION 6 MG/0.6ML ~~LOC~~
6.0000 mg | PREFILLED_SYRINGE | Freq: Once | SUBCUTANEOUS | Status: AC
Start: 2015-10-22 — End: 2015-10-22
  Administered 2015-10-22: 6 mg via SUBCUTANEOUS
  Filled 2015-10-22: qty 0.6

## 2015-10-22 NOTE — Patient Instructions (Signed)
Pegfilgrastim injection What is this medicine? PEGFILGRASTIM (PEG fil gra stim) is a long-acting granulocyte colony-stimulating factor that stimulates the growth of neutrophils, a type of white blood cell important in the body's fight against infection. It is used to reduce the incidence of fever and infection in patients with certain types of cancer who are receiving chemotherapy that affects the bone marrow, and to increase survival after being exposed to high doses of radiation. This medicine may be used for other purposes; ask your health care provider or pharmacist if you have questions. What should I tell my health care provider before I take this medicine? They need to know if you have any of these conditions: -kidney disease -latex allergy -ongoing radiation therapy -sickle cell disease -skin reactions to acrylic adhesives (On-Body Injector only) -an unusual or allergic reaction to pegfilgrastim, filgrastim, other medicines, foods, dyes, or preservatives -pregnant or trying to get pregnant -breast-feeding How should I use this medicine? This medicine is for injection under the skin. If you get this medicine at home, you will be taught how to prepare and give the pre-filled syringe or how to use the On-body Injector. Refer to the patient Instructions for Use for detailed instructions. Use exactly as directed. Take your medicine at regular intervals. Do not take your medicine more often than directed. It is important that you put your used needles and syringes in a special sharps container. Do not put them in a trash can. If you do not have a sharps container, call your pharmacist or healthcare provider to get one. Talk to your pediatrician regarding the use of this medicine in children. While this drug may be prescribed for selected conditions, precautions do apply. Overdosage: If you think you have taken too much of this medicine contact a poison control center or emergency room at  once. NOTE: This medicine is only for you. Do not share this medicine with others. What if I miss a dose? It is important not to miss your dose. Call your doctor or health care professional if you miss your dose. If you miss a dose due to an On-body Injector failure or leakage, a new dose should be administered as soon as possible using a single prefilled syringe for manual use. What may interact with this medicine? Interactions have not been studied. Give your health care provider a list of all the medicines, herbs, non-prescription drugs, or dietary supplements you use. Also tell them if you smoke, drink alcohol, or use illegal drugs. Some items may interact with your medicine. This list may not describe all possible interactions. Give your health care provider a list of all the medicines, herbs, non-prescription drugs, or dietary supplements you use. Also tell them if you smoke, drink alcohol, or use illegal drugs. Some items may interact with your medicine. What should I watch for while using this medicine? You may need blood work done while you are taking this medicine. If you are going to need a MRI, CT scan, or other procedure, tell your doctor that you are using this medicine (On-Body Injector only). What side effects may I notice from receiving this medicine? Side effects that you should report to your doctor or health care professional as soon as possible: -allergic reactions like skin rash, itching or hives, swelling of the face, lips, or tongue -dizziness -fever -pain, redness, or irritation at site where injected -pinpoint red spots on the skin -red or dark-brown urine -shortness of breath or breathing problems -stomach or side pain, or pain   at the shoulder -swelling -tiredness -trouble passing urine or change in the amount of urine Side effects that usually do not require medical attention (report to your doctor or health care professional if they continue or are  bothersome): -bone pain -muscle pain This list may not describe all possible side effects. Call your doctor for medical advice about side effects. You may report side effects to FDA at 1-800-FDA-1088. Where should I keep my medicine? Keep out of the reach of children. Store pre-filled syringes in a refrigerator between 2 and 8 degrees C (36 and 46 degrees F). Do not freeze. Keep in carton to protect from light. Throw away this medicine if it is left out of the refrigerator for more than 48 hours. Throw away any unused medicine after the expiration date. NOTE: This sheet is a summary. It may not cover all possible information. If you have questions about this medicine, talk to your doctor, pharmacist, or health care provider.    2016, Elsevier/Gold Standard. (2014-06-04 14:30:14)  

## 2015-10-26 ENCOUNTER — Ambulatory Visit (HOSPITAL_COMMUNITY)
Admission: RE | Admit: 2015-10-26 | Discharge: 2015-10-26 | Disposition: A | Discharge status: Home / Self Care | Payer: Medicare Other | Source: Ambulatory Visit | Attending: Oncology | Admitting: Oncology

## 2015-10-26 ENCOUNTER — Other Ambulatory Visit: Payer: Self-pay | Admitting: Oncology

## 2015-10-26 ENCOUNTER — Other Ambulatory Visit (HOSPITAL_COMMUNITY): Payer: Self-pay | Admitting: Interventional Radiology

## 2015-10-26 ENCOUNTER — Encounter (HOSPITAL_COMMUNITY): Payer: Self-pay

## 2015-10-26 DIAGNOSIS — Z79899 Other long term (current) drug therapy: Secondary | ICD-10-CM | POA: Insufficient documentation

## 2015-10-26 DIAGNOSIS — Z7982 Long term (current) use of aspirin: Secondary | ICD-10-CM | POA: Insufficient documentation

## 2015-10-26 DIAGNOSIS — Z87891 Personal history of nicotine dependence: Secondary | ICD-10-CM | POA: Insufficient documentation

## 2015-10-26 DIAGNOSIS — C3491 Malignant neoplasm of unspecified part of right bronchus or lung: Secondary | ICD-10-CM

## 2015-10-26 DIAGNOSIS — I2510 Atherosclerotic heart disease of native coronary artery without angina pectoris: Secondary | ICD-10-CM | POA: Insufficient documentation

## 2015-10-26 DIAGNOSIS — I10 Essential (primary) hypertension: Secondary | ICD-10-CM | POA: Diagnosis not present

## 2015-10-26 DIAGNOSIS — E755 Other lipid storage disorders: Secondary | ICD-10-CM | POA: Diagnosis not present

## 2015-10-26 DIAGNOSIS — E785 Hyperlipidemia, unspecified: Secondary | ICD-10-CM | POA: Diagnosis not present

## 2015-10-26 DIAGNOSIS — C3490 Malignant neoplasm of unspecified part of unspecified bronchus or lung: Secondary | ICD-10-CM | POA: Diagnosis not present

## 2015-10-26 LAB — CBC
HEMATOCRIT: 36.8 % (ref 36.0–46.0)
Hemoglobin: 12.5 g/dL (ref 12.0–15.0)
MCH: 30.1 pg (ref 26.0–34.0)
MCHC: 34 g/dL (ref 30.0–36.0)
MCV: 88.7 fL (ref 78.0–100.0)
PLATELETS: 107 10*3/uL — AB (ref 150–400)
RBC: 4.15 MIL/uL (ref 3.87–5.11)
RDW: 14.6 % (ref 11.5–15.5)
WBC: 13.1 10*3/uL — AB (ref 4.0–10.5)

## 2015-10-26 LAB — PROTIME-INR
INR: 0.94 (ref 0.00–1.49)
Prothrombin Time: 12.8 seconds (ref 11.6–15.2)

## 2015-10-26 LAB — APTT: aPTT: 26 seconds (ref 24–37)

## 2015-10-26 MED ORDER — FENTANYL CITRATE (PF) 100 MCG/2ML IJ SOLN
INTRAMUSCULAR | Status: AC | PRN
  Administered 2015-10-26: 50 ug via INTRAVENOUS
  Administered 2015-10-26 (×2): 25 ug via INTRAVENOUS

## 2015-10-26 MED ORDER — MIDAZOLAM HCL 2 MG/2ML IJ SOLN
INTRAMUSCULAR | Status: AC
  Filled 2015-10-26: qty 6

## 2015-10-26 MED ORDER — SODIUM CHLORIDE 0.9 % IV SOLN: INTRAVENOUS | Status: DC

## 2015-10-26 MED ORDER — MIDAZOLAM HCL 2 MG/2ML IJ SOLN
INTRAMUSCULAR | Status: AC | PRN
  Administered 2015-10-26 (×4): 1 mg via INTRAVENOUS

## 2015-10-26 MED ORDER — HEPARIN SOD (PORK) LOCK FLUSH 100 UNIT/ML IV SOLN
INTRAVENOUS | Status: AC
  Filled 2015-10-26: qty 5

## 2015-10-26 MED ORDER — LIDOCAINE HCL 1 % IJ SOLN
INTRAMUSCULAR | Status: AC | PRN
  Administered 2015-10-26: 15 mL

## 2015-10-26 MED ORDER — CEFAZOLIN SODIUM-DEXTROSE 2-4 GM/100ML-% IV SOLN
2.0000 g | INTRAVENOUS | Status: AC
  Administered 2015-10-26: 2 g via INTRAVENOUS
  Filled 2015-10-26: qty 100

## 2015-10-26 MED ORDER — HEPARIN SOD (PORK) LOCK FLUSH 100 UNIT/ML IV SOLN
INTRAVENOUS | Status: AC | PRN
  Administered 2015-10-26: 500 [IU]

## 2015-10-26 MED ORDER — LIDOCAINE HCL 1 % IJ SOLN
INTRAMUSCULAR | Status: AC
  Filled 2015-10-26: qty 20

## 2015-10-26 MED ORDER — FENTANYL CITRATE (PF) 100 MCG/2ML IJ SOLN
INTRAMUSCULAR | Status: AC
  Filled 2015-10-26: qty 4

## 2015-10-26 NOTE — Procedures (Signed)
RIJV PAC SVC RA No comp/EBL

## 2015-10-26 NOTE — H&P (Signed)
Chief Complaint: poor venous access, lung cancer  Referring Physician:Dr. Dominica Severin B. Sherrill  Supervising Physician: Marybelle Killings  Patient Status:  Out-pt  HPI: Margaret Hale is an 81 y.o. female who has a history of lung cancer.  She has been undergoing chemotherapy treatments.  She just received a Neulasta injection 4 days ago which has caused her some body aches, but other than that, she has been feeling well.  She presents today for [facility] placement to ease in her chemo treatments.  Past Medical History:  Past Medical History  Diagnosis Date  . Hyperlipidemia   . CAD (coronary artery disease) 2007    PCI of RCA, bare metal stent.....Dr. Radford Pax  . Thyroid disease     nodules,stable on u/s 2/12  . Baker's cyst     L KNEE  . Vitamin D deficiency   . Disc disorder of lumbar region     2 BULDGING DISCS DX 1990 BY DR. DALLDORF  . No pertinent past medical history     lung mass  occas cough   . Dizzy   . Hypertension     Takes Novasc ,Clonidine, & Losartan  . Cancer (HCC)     lung mass  . Anxiety     Past Surgical History:  Past Surgical History  Procedure Laterality Date  . Carpal tunnel release  06/26/08        04/20/09    RIGHT HAND          LEFT HAND  . Finger contracture release  8/12    Dr. Amedeo Plenty    right hand trigger finger release  . Eye surgery  01/07/09    LEFT CATARACT  . Eye surgery  01/21/09    RIGHT CATARACT  . Coronary angioplasty      dr h Tamala Julian [phone] with one stent  . Mediastinoscopy  08/02/2011    Procedure: MEDIASTINOSCOPY;  Surgeon: Nicanor Alcon, MD;  Location: [facility] OR;  Service: Thoracic;  Laterality: N/A;  . Craniotomy Right 06/09/2015    Procedure: CRANIOTOMY TUMOR EXCISION WITH APPLICATION OF CRANIAL NAVIGATION;  Surgeon: Newman Pies, MD;  Location: [facility] NEURO ORS;  Service: Neurosurgery;  Laterality: Right;  Right temporal craniotomy for resection of tumor    Family History:  Family History  Problem Relation Age of Onset  . Anesthesia  problems Neg Hx   . Hypotension Neg Hx   . Malignant hyperthermia Neg Hx   . Pseudochol deficiency Neg Hx     Social History:  reports that she quit smoking about 10 years ago. Her smoking use included Cigarettes. She has a 5 pack-year smoking history. She has never used smokeless tobacco. She reports that she does not drink alcohol or use illicit drugs.  Allergies:  Allergies  Allergen Reactions  . Food     Sausage, hot dogs, salami (rash)  . Lipitor [Atorvastatin Calcium] Other (See Comments)    MYALGIAS  . Pantoprazole Sodium Rash    Medications:   Medication List    ASK your doctor about these medications        amLODipine 10 MG tablet  Commonly known as:  NORVASC  Take 10 mg by mouth every evening.     aspirin 81 MG tablet  Take 81 mg by mouth daily.     FISH OIL PO  Take 4,000 Units by mouth 2 (two) times daily.     hydrochlorothiazide 25 MG tablet  Commonly known as:  HYDRODIURIL  Take 25 mg by mouth  daily.     memantine 10 MG tablet  Commonly known as:  NAMENDA  Take 2 tablets (20 mg total) by mouth daily.     metoprolol succinate 100 MG 24 hr tablet  Commonly known as:  TOPROL-XL  Take 100 mg by mouth daily. Take with or immediately following a meal.     ondansetron 8 MG tablet  Commonly known as:  ZOFRAN  Take 1 tablet (8 mg total) by mouth every 8 (eight) hours as needed for nausea or vomiting.     prochlorperazine 10 MG tablet  Commonly known as:  COMPAZINE  Take 0.5 tablets (5 mg total) by mouth every 6 (six) hours as needed for nausea or vomiting.     rOPINIRole 1 MG tablet  Commonly known as:  REQUIP  Take 1 mg by mouth at bedtime.     valsartan 320 MG tablet  Commonly known as:  DIOVAN  Take 320 mg by mouth daily.        Please HPI for pertinent positives, otherwise complete 10 system ROS negative.  Mallampati Score: MD Evaluation Airway: WNL Heart: WNL Abdomen: WNL Chest/ Lungs: WNL ASA  Classification: 3 Mallampati/Airway  Score: Two  Physical Exam: Temp (F)   97.7  97.7 (36.5)  05/30 1128   Pulse Rate   51   51  05/30 1128   Resp   16  16  05/30 1128   BP   128/56   128/56  05/30 1128   SpO2 (%)   95  95  05/30 1128   Weight (lb)   183  183 lb 9.6 oz (83.28 kg)  05/      General: pleasant, WD, WN white female who is laying in bed in NAD HEENT: head is normocephalic, atraumatic.  Sclera are noninjected.  PERRL.  Ears and nose without any masses or lesions.  Mouth is pink and moist Heart: regular, rate, and rhythm.  Normal s1,s2. No obvious murmurs, gallops, or rubs noted.  Palpable radial and pedal pulses bilaterally Lungs: CTAB, no wheezes, rhonchi, or rales noted.  Respiratory effort nonlabored Abd: soft, NT, ND, +BS, no masses, hernias, or organomegaly MS: all 4 extremities are symmetrical with no cyanosis, clubbing, or edema. Psych: A&Ox3 with an appropriate affect.   Labs: Results for orders placed or performed during the hospital encounter of 10/26/15 (from the past 48 hour(s))  APTT     Status: None   Collection Time: 10/26/15 11:40 AM  Result Value Ref Range   aPTT 26 24 - 37 seconds  CBC     Status: Abnormal   Collection Time: 10/26/15 11:40 AM  Result Value Ref Range   WBC 13.1 (H) 4.0 - 10.5 K/uL   RBC 4.15 3.87 - 5.11 MIL/uL   Hemoglobin 12.5 12.0 - 15.0 g/dL   HCT 36.8 36.0 - 46.0 %   MCV 88.7 78.0 - 100.0 fL   MCH 30.1 26.0 - 34.0 pg   MCHC 34.0 30.0 - 36.0 g/dL   RDW 14.6 11.5 - 15.5 %   Platelets 107 (L) 150 - 400 K/uL    Comment: REPEATED TO VERIFY SPECIMEN CHECKED FOR CLOTS PLATELET COUNT CONFIRMED BY SMEAR   Protime-INR     Status: None   Collection Time: 10/26/15 11:40 AM  Result Value Ref Range   Prothrombin Time 12.8 11.6 - 15.2 seconds   INR 0.94 0.00 - 1.49    Imaging: No results found.  Assessment/Plan 1. Lung cancer with poor venous access -  we will plan to pursue PAC placement today -her WBC is 13K.  She denies any infectious symptoms.  I have  spoken with Dr. Benay Spice and confirmed in the computer that she received her Neulasta injection on 5/26 and her WBC is from this. -labs and vitals otherwise are normal.   -Risks and Benefits discussed with the patient including, but not limited to bleeding, infection, pneumothorax, or fibrin sheath development and need for additional procedures. All of the patient's questions were answered, patient is agreeable to proceed. Consent signed and in chart.  Thank you for this interesting consult.  I greatly enjoyed meeting Margaret Hale and look forward to participating in their care.  A copy of this report was sent to the requesting provider on this date.  Electronically Signed: Henreitta Cea 10/26/2015, 1:36 PM   I spent a total of  30 Minutes   in face to face in clinical consultation, greater than 50% of which was counseling/coordinating care for poor venous access

## 2015-10-26 NOTE — Discharge Instructions (Signed)
Implanted Port Home Guide °An implanted port is a type of central line that is placed under the skin. Central lines are used to provide IV access when treatment or nutrition needs to be given through a person's veins. Implanted ports are used for long-term IV access. An implanted port may be placed because:  °· You need IV medicine that would be irritating to the small veins in your hands or arms.   °· You need long-term IV medicines, such as antibiotics.   °· You need IV nutrition for a long period.   °· You need frequent blood draws for lab tests.   °· You need dialysis.   °Implanted ports are usually placed in the chest area, but they can also be placed in the upper arm, the abdomen, or the leg. An implanted port has two main parts:  °· Reservoir. The reservoir is round and will appear as a small, raised area under your skin. The reservoir is the part where a needle is inserted to give medicines or draw blood.   °· Catheter. The catheter is a thin, flexible tube that extends from the reservoir. The catheter is placed into a large vein. Medicine that is inserted into the reservoir goes into the catheter and then into the vein.   °HOW WILL I CARE FOR MY INCISION SITE? °Do not get the incision site wet. Bathe or shower as directed by your health care provider.  °HOW IS MY PORT ACCESSED? °Special steps must be taken to access the port:  °· Before the port is accessed, a numbing cream can be placed on the skin. This helps numb the skin over the port site.   °· Your health care provider uses a sterile technique to access the port. °· Your health care provider must put on a mask and sterile gloves. °· The skin over your port is cleaned carefully with an antiseptic and allowed to dry. °· The port is gently pinched between sterile gloves, and a needle is inserted into the port. °· Only "non-coring" port needles should be used to access the port. Once the port is accessed, a blood return should be checked. This helps  ensure that the port is in the vein and is not clogged.   °· If your port needs to remain accessed for a constant infusion, a clear (transparent) bandage will be placed over the needle site. The bandage and needle will need to be changed every week, or as directed by your health care provider.   °· Keep the bandage covering the needle clean and dry. Do not get it wet. Follow your health care provider's instructions on how to take a shower or bath while the port is accessed.   °· If your port does not need to stay accessed, no bandage is needed over the port.   °WHAT IS FLUSHING? °Flushing helps keep the port from getting clogged. Follow your health care provider's instructions on how and when to flush the port. Ports are usually flushed with saline solution or a medicine called heparin. The need for flushing will depend on how the port is used.  °· If the port is used for intermittent medicines or blood draws, the port will need to be flushed:   °· After medicines have been given.   °· After blood has been drawn.   °· As part of routine maintenance.   °· If a constant infusion is running, the port may not need to be flushed.   °HOW LONG WILL MY PORT STAY IMPLANTED? °The port can stay in for as long as your health care   provider thinks it is needed. When it is time for the port to come out, surgery will be done to remove it. The procedure is similar to the one performed when the port was put in.  WHEN SHOULD I SEEK IMMEDIATE MEDICAL CARE? When you have an implanted port, you should seek immediate medical care if:   You notice a bad smell coming from the incision site.   You have swelling, redness, or drainage at the incision site.   You have more swelling or pain at the port site or the surrounding area.   You have a fever that is not controlled with medicine.   This information is not intended to replace advice given to you by your health care provider. Make sure you discuss any questions you have with  your health care provider.   Document Released: 05/15/2005 Document Revised: 03/05/2013 Document Reviewed: 01/20/2013 Elsevier Interactive Patient Education 2016 [place] Insertion, Care After Refer to this sheet in the next few weeks. These instructions provide you with information on caring for yourself after your procedure. Your health care provider may also give you more specific instructions. Your treatment has been planned according to current medical practices, but problems sometimes occur. Call your health care provider if you have any problems or questions after your procedure. WHAT TO EXPECT AFTER THE PROCEDURE After your procedure, it is typical to have the following:   Discomfort at the port insertion site. Ice packs to the area will help.  Bruising on the skin over the port. This will subside in 3-4 days. HOME CARE INSTRUCTIONS  After your port is placed, you will get a manufacturer's information card. The card has information about your port. Keep this card with you at all times.   Know what kind of port you have. There are many types of ports available.   Wear a medical alert bracelet in case of an emergency. This can help alert health care workers that you have a port.   The port can stay in for as long as your health care provider believes it is necessary.   A home health care nurse may give medicines and take care of the port.   You or a family member can get special training and directions for giving medicine and taking care of the port at home.  SEEK MEDICAL CARE IF:   Your port does not flush or you are unable to get a blood return.   You have a fever or chills. SEEK IMMEDIATE MEDICAL CARE IF:  You have new fluid or pus coming from your incision.   You notice a bad smell coming from your incision site.   You have swelling, pain, or more redness at the incision or port site.   You have chest pain or shortness of breath.   This  information is not intended to replace advice given to you by your health care provider. Make sure you discuss any questions you have with your health care provider.   Document Released: 03/05/2013 Document Revised: 05/20/2013 Document Reviewed: 03/05/2013 Elsevier Interactive Patient Education 2016 [employer] Moderate Conscious Sedation, Adult, Care After Refer to this sheet in the next few weeks. These instructions provide you with information on caring for yourself after your procedure. Your health care provider may also give you more specific instructions. Your treatment has been planned according to current medical practices, but problems sometimes occur. Call your health care provider if you have any problems or questions after your  procedure. WHAT TO EXPECT AFTER THE PROCEDURE  After your procedure:  You may feel sleepy, clumsy, and have poor balance for several hours.  Vomiting may occur if you eat too soon after the procedure. HOME CARE INSTRUCTIONS  Do not participate in any activities where you could become injured for at least 24 hours. Do not:  Drive.  Swim.  Ride a bicycle.  Operate heavy machinery.  Cook.  Use power tools.  Climb ladders.  Work from a high place.  Do not make important decisions or sign legal documents until you are improved.  If you vomit, drink water, juice, or soup when you can drink without vomiting. Make sure you have little or no nausea before eating solid foods.  Only take over-the-counter or prescription medicines for pain, discomfort, or fever as directed by your health care provider.  Make sure you and your family fully understand everything about the medicines given to you, including what side effects may occur.  You should not drink alcohol, take sleeping pills, or take medicines that cause drowsiness for at least 24 hours.  If you smoke, do not smoke without supervision.  If you are feeling better, you may resume normal  activities 24 hours after you were sedated.  Keep all appointments with your health care provider. SEEK MEDICAL CARE IF:  Your skin is pale or bluish in color.  You continue to feel nauseous or vomit.  Your pain is getting worse and is not helped by medicine.  You have bleeding or swelling.  You are still sleepy or feeling clumsy after 24 hours. SEEK IMMEDIATE MEDICAL CARE IF:  You develop a rash.  You have difficulty breathing.  You develop any type of allergic problem.  You have a fever. MAKE SURE YOU:  Understand these instructions.  Will watch your condition.  Will get help right away if you are not doing well or get worse.   This information is not intended to replace advice given to you by your health care provider. Make sure you discuss any questions you have with your health care provider.   Document Released: 03/05/2013 Document Revised: 06/05/2014 Document Reviewed: 03/05/2013 Elsevier Interactive Patient Education [employer].

## 2015-10-29 ENCOUNTER — Other Ambulatory Visit (HOSPITAL_COMMUNITY): Payer: Self-pay | Admitting: Interventional Radiology

## 2015-10-29 ENCOUNTER — Ambulatory Visit (HOSPITAL_COMMUNITY)
Admission: RE | Admit: 2015-10-29 | Discharge: 2015-10-29 | Disposition: A | Discharge status: Home / Self Care | Payer: Medicare Other | Source: Ambulatory Visit | Attending: Interventional Radiology | Admitting: Interventional Radiology

## 2015-10-29 DIAGNOSIS — Z95828 Presence of other vascular implants and grafts: Secondary | ICD-10-CM

## 2015-10-29 NOTE — Progress Notes (Signed)
Patient ID: Margaret Hale, female   DOB: Sep 04, 1934, 81 y.o.   MRN: [id number] Patient with history of lung cancer and poor venous access. She is status  post placement of a right internal jugular port a cath by IR on 10/26/15. She presented to IR department today secondary to concern over pain at port site. On exam Port-A-Cath site has ecchymotic regions at both IJ access point as well as above and below and on port pocket. There is no significant fluctuation, edema or drainage at either site. Area is mildly tender to palpation. Patient is afebrile. The port has not been accessed since placement. Patient was also seen by Dr. Kathlene Cote. At this time recommend alternating cold / warm compresses to site. Patient did not wish to have prescription for pain medication at this time. She was instructed to monitor for fever or persistent redness or swelling at port site. If this occurs she was told to contact our service or oncology for further evaluation.

## 2015-11-02 ENCOUNTER — Other Ambulatory Visit: Payer: Self-pay | Admitting: *Deleted

## 2015-11-02 ENCOUNTER — Ambulatory Visit (HOSPITAL_BASED_OUTPATIENT_CLINIC_OR_DEPARTMENT_OTHER): Payer: Medicare Other | Admitting: Oncology

## 2015-11-02 ENCOUNTER — Telehealth: Payer: Self-pay | Admitting: Oncology

## 2015-11-02 VITALS — BP 127/60 | HR 57 | Temp 98.0°F | Resp 18 | Wt 184.1 lb

## 2015-11-02 DIAGNOSIS — C7931 Secondary malignant neoplasm of brain: Secondary | ICD-10-CM

## 2015-11-02 DIAGNOSIS — I10 Essential (primary) hypertension: Secondary | ICD-10-CM

## 2015-11-02 DIAGNOSIS — C3491 Malignant neoplasm of unspecified part of right bronchus or lung: Secondary | ICD-10-CM | POA: Diagnosis not present

## 2015-11-02 MED ORDER — LIDOCAINE-PRILOCAINE 2.5-2.5 % EX CREA: 1.0000 "application " | TOPICAL_CREAM | CUTANEOUS | Status: DC | PRN

## 2015-11-02 NOTE — Telephone Encounter (Signed)
Received message from patient asking for a return call.  Patient's message stated that she needed "help".  Return call placed to patient and patient very anxious and concerned that her port is infected. Port was placed on 10/26/15.  Pt states that there is no drainage or redness to site and no pain other then at the port site.  Pt requesting to come in this afternoon to have port site assessed.  Dr. Benay Spice notified and OK for patient to come in this afternoon at 3:00PM to see Dr. Benay Spice. Will notify patient of time.

## 2015-11-02 NOTE — Progress Notes (Signed)
[facility] OFFICE PROGRESS NOTE   Diagnosis: Small cell cancer  INTERVAL HISTORY:   Mrs. Caputo underwent placement of a right IJ Port-A-Cath on 10/26/2015. She is concerned the Port-A-Cath site may be infected. She was seen for a follow-up visit in interventional radiology on 10/29/2015 with pain at the Port-A-Cath site.  No fever.    Objective:  Vital signs in last 24 hours:  Blood pressure 127/60, pulse 57, temperature 98 F (36.7 C), temperature source Oral, resp. rate 18, weight 184 lb 1.6 oz (83.507 kg), SpO2 97 %.     Portacath/at the right chest with a surrounding ecchymosis that appears to be resolving. No erythema. No fluctuance or tenderness. There is a linear area of superficial ulceration inferior to the Port-A-Cath consistent with a tape reaction. No erythema or tenderness along the subcutaneous tract.  Lab Results:  Lab Results  Component Value Date   WBC 13.1* 10/26/2015   HGB 12.5 10/26/2015   HCT 36.8 10/26/2015   MCV 88.7 10/26/2015   PLT 107* 10/26/2015   NEUTROABS 2.2 10/19/2015     Imaging:  Ir Patient Eval Tech 0-60 Mins  10/29/2015  Patient Information  Patient Name Sex DOB SSN  Jeidi, Gilles Female [birth year] [id number]  Progress Notes by Nicki Reaper, PA-C at 10/29/2015 4:50 PM  Author: Nicki Reaper, PA-C Service: Radiology Author Type: Physician Assistant  Filed: 10/29/2015 4:55 PM Note Time: 10/29/2015 4:50 PM Status: VOJJKK Needed  Editor: Nicki Reaper, PA-C (Physician Assistant) Cosign Required: Yes  Expand All Collapse All  Patient ID: Glennie Isle, female DOB: Oct 17, 1934, 81 y.o. MRN: [id number] Patient with history of lung cancer and poor venous access. She is status post placement of a right internal jugular port a cath by IR on 10/26/15. She presented to IR department today secondary to concern over pain at port site. On exam Port-A-Cath site has ecchymotic regions at both IJ access point as  well as above and below and on port pocket. There is no significant fluctuation, edema or drainage at either site. Area is mildly tender to palpation. Patient is afebrile. The port has not been accessed since placement. Patient was also seen by Dr. Kathlene Cote. At this time recommend alternating cold / warm compresses to site. Patient did not wish to have prescription for pain medication at this time. She was instructed to monitor for fever or persistent redness or swelling at port site. If this occurs she was told to contact our service or oncology for further evaluation.      Medications: I have reviewed the patient's current medications.  Assessment/Plan: 1. Extensive stage small cell lung cancer  resection of right temporal brain mass 06/09/2015 confirmed metastatic small cell carcinoma  Staging PET scan at [facility] 07/22/2015 revealed decreased activity associated with previously seen pulmonary nodules , decreased activity of hypermetabolic mediastinal and bilateral hilar nodes , no new sites of FDG avid metastatic disease compared to a [facility] PET scan from November 2016  cycle 1 etoposide/carboplatin 08/16/2015  Cycle 2 etoposide/carboplatin 09/14/2015  2. Chronic history of mediastinal lymphadenopathy and pulmonary nodules- diagnostic workup in 2013 including bronchoscopy/ EBUS , and mediastinoscopy -negative for malignancy, , granulomatous changes noted  3. Hypertension  4. History of CAD  5. "Sweats "-most likely related to post menopausal hot flashes  6.     Port-A-Cath placement 10/26/2015    Disposition:  Ms. Paolillo underwent placement of a Port-A-Cath 10/26/2015. There is a resolving ecchymosis at the Port-A-Cath site. There is  a linear area of erythema/superficial ulceration consistent with a tape reaction. No evidence of infection.  She will return as scheduled after a restaging chest CT. She will contact us in the interim as  needed.  Betsy Coder, MD  11/02/2015  3:21 PM

## 2015-11-02 NOTE — Telephone Encounter (Signed)
spoke w/ pt confirmed added apt today

## 2015-11-10 ENCOUNTER — Other Ambulatory Visit: Payer: Self-pay | Admitting: Radiation Therapy

## 2015-11-10 DIAGNOSIS — C7949 Secondary malignant neoplasm of other parts of nervous system: Principal | ICD-10-CM

## 2015-11-10 DIAGNOSIS — C7931 Secondary malignant neoplasm of brain: Secondary | ICD-10-CM

## 2015-11-12 ENCOUNTER — Ambulatory Visit (HOSPITAL_COMMUNITY)
Admission: RE | Admit: 2015-11-12 | Discharge: 2015-11-12 | Disposition: A | Discharge status: Home / Self Care | Payer: Medicare Other | Source: Ambulatory Visit | Attending: Oncology | Admitting: Oncology

## 2015-11-12 DIAGNOSIS — J984 Other disorders of lung: Secondary | ICD-10-CM | POA: Insufficient documentation

## 2015-11-12 DIAGNOSIS — I7090 Unspecified atherosclerosis: Secondary | ICD-10-CM | POA: Insufficient documentation

## 2015-11-12 DIAGNOSIS — R590 Localized enlarged lymph nodes: Secondary | ICD-10-CM | POA: Insufficient documentation

## 2015-11-12 DIAGNOSIS — C3491 Malignant neoplasm of unspecified part of right bronchus or lung: Secondary | ICD-10-CM | POA: Diagnosis present

## 2015-11-16 ENCOUNTER — Other Ambulatory Visit: Payer: Self-pay | Admitting: Oncology

## 2015-11-16 ENCOUNTER — Telehealth: Payer: Self-pay | Admitting: Nurse Practitioner

## 2015-11-16 ENCOUNTER — Ambulatory Visit: Payer: Medicare Other

## 2015-11-16 ENCOUNTER — Ambulatory Visit: Payer: Medicare Other | Admitting: Oncology

## 2015-11-16 ENCOUNTER — Ambulatory Visit (HOSPITAL_BASED_OUTPATIENT_CLINIC_OR_DEPARTMENT_OTHER): Payer: Medicare Other | Admitting: Nurse Practitioner

## 2015-11-16 ENCOUNTER — Other Ambulatory Visit: Payer: Medicare Other

## 2015-11-16 ENCOUNTER — Ambulatory Visit (HOSPITAL_BASED_OUTPATIENT_CLINIC_OR_DEPARTMENT_OTHER): Payer: Medicare Other

## 2015-11-16 ENCOUNTER — Other Ambulatory Visit (HOSPITAL_BASED_OUTPATIENT_CLINIC_OR_DEPARTMENT_OTHER): Payer: Medicare Other

## 2015-11-16 VITALS — BP 169/68 | HR 51

## 2015-11-16 VITALS — BP 175/74 | HR 57 | Temp 97.7°F | Resp 18 | Ht 64.0 in | Wt 182.2 lb

## 2015-11-16 DIAGNOSIS — C7931 Secondary malignant neoplasm of brain: Secondary | ICD-10-CM | POA: Diagnosis not present

## 2015-11-16 DIAGNOSIS — C3491 Malignant neoplasm of unspecified part of right bronchus or lung: Secondary | ICD-10-CM

## 2015-11-16 DIAGNOSIS — Z5111 Encounter for antineoplastic chemotherapy: Secondary | ICD-10-CM

## 2015-11-16 DIAGNOSIS — R5383 Other fatigue: Secondary | ICD-10-CM | POA: Diagnosis not present

## 2015-11-16 DIAGNOSIS — I10 Essential (primary) hypertension: Secondary | ICD-10-CM

## 2015-11-16 DIAGNOSIS — N951 Menopausal and female climacteric states: Secondary | ICD-10-CM

## 2015-11-16 LAB — COMPREHENSIVE METABOLIC PANEL
ALT: 15 U/L (ref 0–55)
ANION GAP: 8 meq/L (ref 3–11)
AST: 15 U/L (ref 5–34)
Albumin: 3.7 g/dL (ref 3.5–5.0)
Alkaline Phosphatase: 88 U/L (ref 40–150)
BILIRUBIN TOTAL: 0.5 mg/dL (ref 0.20–1.20)
BUN: 17.6 mg/dL (ref 7.0–26.0)
CALCIUM: 9.5 mg/dL (ref 8.4–10.4)
CHLORIDE: 104 meq/L (ref 98–109)
CO2: 29 mEq/L (ref 22–29)
CREATININE: 0.8 mg/dL (ref 0.6–1.1)
EGFR: 72 mL/min/{1.73_m2} — AB (ref >90)
Glucose: 103 mg/dl (ref 70–140)
Potassium: 3.9 mEq/L (ref 3.5–5.1)
Sodium: 142 mEq/L (ref 136–145)
TOTAL PROTEIN: 7.1 g/dL (ref 6.4–8.3)

## 2015-11-16 LAB — CBC WITH DIFFERENTIAL/PLATELET
BASO%: 0.6 % (ref 0.0–2.0)
Basophils Absolute: 0 10*3/uL (ref 0.0–0.1)
EOS%: 2.1 % (ref 0.0–7.0)
Eosinophils Absolute: 0.1 10*3/uL (ref 0.0–0.5)
HEMATOCRIT: 41.9 % (ref 34.8–46.6)
HGB: 13.9 g/dL (ref 11.6–15.9)
LYMPH%: 15.6 % (ref 14.0–49.7)
MCH: 30.1 pg (ref 25.1–34.0)
MCHC: 33.1 g/dL (ref 31.5–36.0)
MCV: 91 fL (ref 79.5–101.0)
MONO#: 0.6 10*3/uL (ref 0.1–0.9)
MONO%: 10.6 % (ref 0.0–14.0)
NEUT%: 71.1 % (ref 38.4–76.8)
NEUTROS ABS: 4 10*3/uL (ref 1.5–6.5)
PLATELETS: 223 10*3/uL (ref 145–400)
RBC: 4.61 10*6/uL (ref 3.70–5.45)
RDW: 15 % — ABNORMAL HIGH (ref 11.2–14.5)
WBC: 5.6 10*3/uL (ref 3.9–10.3)
lymph#: 0.9 10*3/uL (ref 0.9–3.3)

## 2015-11-16 MED ORDER — SODIUM CHLORIDE 0.9 % IV SOLN
410.0000 mg | Freq: Once | INTRAVENOUS | Status: AC
  Administered 2015-11-16: 410 mg via INTRAVENOUS
  Filled 2015-11-16: qty 41

## 2015-11-16 MED ORDER — SODIUM CHLORIDE 0.9 % IV SOLN
Freq: Once | INTRAVENOUS | Status: AC
  Administered 2015-11-16: 16:00:00 via INTRAVENOUS

## 2015-11-16 MED ORDER — SODIUM CHLORIDE 0.9 % IV SOLN
80.0000 mg/m2 | Freq: Once | INTRAVENOUS | Status: AC
  Administered 2015-11-16: 150 mg via INTRAVENOUS
  Filled 2015-11-16: qty 7.5

## 2015-11-16 MED ORDER — SODIUM CHLORIDE 0.9% FLUSH
10.0000 mL | INTRAVENOUS | Status: DC | PRN
  Administered 2015-11-16: 10 mL
  Filled 2015-11-16: qty 10

## 2015-11-16 MED ORDER — PALONOSETRON HCL INJECTION 0.25 MG/5ML
INTRAVENOUS | Status: AC
  Filled 2015-11-16: qty 5

## 2015-11-16 MED ORDER — SODIUM CHLORIDE 0.9 % IV SOLN
5.0000 mg | Freq: Once | INTRAVENOUS | Status: AC
  Administered 2015-11-16: 5 mg via INTRAVENOUS
  Filled 2015-11-16: qty 0.5

## 2015-11-16 MED ORDER — PALONOSETRON HCL INJECTION 0.25 MG/5ML
0.2500 mg | Freq: Once | INTRAVENOUS | Status: AC
  Administered 2015-11-16: 0.25 mg via INTRAVENOUS

## 2015-11-16 MED ORDER — HEPARIN SOD (PORK) LOCK FLUSH 100 UNIT/ML IV SOLN
500.0000 [IU] | Freq: Once | INTRAVENOUS | Status: AC | PRN
  Administered 2015-11-16: 500 [IU]
  Filled 2015-11-16: qty 5

## 2015-11-16 NOTE — Telephone Encounter (Signed)
per pof to sch pt appt-sch trmt-pt to get updated copy '@6'$ /21 appt

## 2015-11-16 NOTE — Progress Notes (Addendum)
  [place] OFFICE PROGRESS NOTE   Diagnosis:  Small cell cancer  INTERVAL HISTORY:   Margaret Hale returns as scheduled. She completed cycle 3 carboplatin/etoposide beginning 10/19/2015. She has no significant nausea following treatment. No mouth sores. No diarrhea. She has intermittent occasional shortness of breath. She continues to have hot flashes. Main complaint is fatigue.  Objective:  Vital signs in last 24 hours:  Blood pressure 175/74, pulse 57, temperature 97.7 F (36.5 C), temperature source Oral, resp. rate 18, height '5\' 4"'$  (1.626 m), weight 182 lb 3.2 oz (82.645 kg), SpO2 99 %.    HEENT: No thrush or ulcers. Resp: Scattered rhonchi. No respiratory distress. Cardio: Regular rate and rhythm. GI: Abdomen soft and nontender. No hepatomegaly. Vascular: No leg edema.  Port-A-Cath without erythema.  Lab Results:  Lab Results  Component Value Date   WBC 5.6 11/16/2015   HGB 13.9 11/16/2015   HCT 41.9 11/16/2015   MCV 91.0 11/16/2015   PLT 223 11/16/2015   NEUTROABS 4.0 11/16/2015    Imaging:  No results found.  Medications: I have reviewed the patient's current medications.  Assessment/Plan: 1. Extensive stage small cell lung cancer  resection of right temporal brain mass 06/09/2015 confirmed metastatic small cell carcinoma  Staging PET scan at [facility] 07/22/2015 revealed decreased activity associated with previously seen pulmonary nodules , decreased activity of hypermetabolic mediastinal and bilateral hilar nodes , no new sites of FDG avid metastatic disease compared to a [facility] PET scan from November 2016  Cycle 1 etoposide/carboplatin 08/16/2015  Cycle 2 etoposide/carboplatin 09/14/2015  Cycle 3 etoposide/carboplatin 10/19/2015  Restaging chest CT 11/12/2015 with generally stable appearance of solid, sub-solid and ground glass pulmonary nodules bilaterally. Stable mildly prominent mediastinal lymph nodes. No progressive adenopathy  or new findings.  2. Chronic history of mediastinal lymphadenopathy and pulmonary nodules- diagnostic workup in 2013 including bronchoscopy/ EBUS , and mediastinoscopy -negative for malignancy, , granulomatous changes noted  3. Hypertension  4. History of CAD  5. "Sweats "-most likely related to post menopausal hot flashes  6. Port-A-Cath placement 10/26/2015   Disposition: Margaret Hale has completed 3 cycles of carboplatin/etoposide. The recent restaging chest CT showed no change in the pulmonary nodules. Dr. Benay Spice recommends continuation of carboplatin/etoposide for 3 additional cycles. Plan to proceed with cycle 4 today as scheduled. She will return for a follow-up visit and cycle 5 in 4 weeks. She will contact the office in the interim with any problems.  Patient seen with Dr. Benay Spice.    Ned Card ANP/GNP-BC   11/16/2015  2:21 PM   This was a shared visit with Ned Card. Margaret Hale appears stable. We reviewed the CT findings with her. The lung nodules have not changed and there was no evidence of disease progression. She will complete a fourth cycle of etoposide/carboplatin today.  I will contact Dr. Ready to discuss the indication for completing 3 more cycles of chemotherapy or considering a maintenance trial.  Julieanne Manson, M.D.

## 2015-11-16 NOTE — Telephone Encounter (Signed)
per pof to sch pt appt-sent staff message to Lattie Haw T no openings on [facility] on 7/18-advised to reply w.date and time of appt-wills ch and call pt after reply

## 2015-11-16 NOTE — Patient Instructions (Signed)
[facility] Discharge Instructions for Patients Receiving Chemotherapy  Today you received the following chemotherapy agents Etoposide and Carboplatin. To help prevent nausea and vomiting after your treatment, we encourage you to take your nausea medication as directed - NO ZOFRAN FOR 3 DAYS  If you develop nausea and vomiting that is not controlled by your nausea medication, call the clinic.   BELOW ARE SYMPTOMS THAT SHOULD BE REPORTED IMMEDIATELY:  *FEVER GREATER THAN 100.5 F  *CHILLS WITH OR WITHOUT FEVER  NAUSEA AND VOMITING THAT IS NOT CONTROLLED WITH YOUR NAUSEA MEDICATION  *UNUSUAL SHORTNESS OF BREATH  *UNUSUAL BRUISING OR BLEEDING  TENDERNESS IN MOUTH AND THROAT WITH OR WITHOUT PRESENCE OF ULCERS  *URINARY PROBLEMS  *BOWEL PROBLEMS  UNUSUAL RASH Items with * indicate a potential emergency and should be followed up as soon as possible.  Feel free to call the clinic you have any questions or concerns. The clinic phone number is [phone].  Please show the [place] at check-in to the Emergency Department and triage nurse.

## 2015-11-16 NOTE — Progress Notes (Signed)
Talked with patient about BP, states she takes all her BP meds and checks her BP at home. Informed patient to continue monitoring and call her PCP if it remains high. Pt verbalized understanding.

## 2015-11-17 ENCOUNTER — Ambulatory Visit: Payer: Medicare Other

## 2015-11-17 ENCOUNTER — Ambulatory Visit (HOSPITAL_BASED_OUTPATIENT_CLINIC_OR_DEPARTMENT_OTHER): Payer: Medicare Other

## 2015-11-17 ENCOUNTER — Telehealth: Payer: Self-pay | Admitting: Nurse Practitioner

## 2015-11-17 ENCOUNTER — Telehealth: Payer: Self-pay | Admitting: *Deleted

## 2015-11-17 VITALS — BP 160/64 | HR 65 | Temp 97.6°F | Resp 20

## 2015-11-17 DIAGNOSIS — Z5111 Encounter for antineoplastic chemotherapy: Secondary | ICD-10-CM | POA: Diagnosis not present

## 2015-11-17 DIAGNOSIS — C3491 Malignant neoplasm of unspecified part of right bronchus or lung: Secondary | ICD-10-CM

## 2015-11-17 DIAGNOSIS — C7931 Secondary malignant neoplasm of brain: Secondary | ICD-10-CM | POA: Diagnosis not present

## 2015-11-17 MED ORDER — SODIUM CHLORIDE 0.9 % IV SOLN
Freq: Once | INTRAVENOUS | Status: AC
  Administered 2015-11-17: 12:00:00 via INTRAVENOUS

## 2015-11-17 MED ORDER — SODIUM CHLORIDE 0.9 % IV SOLN
5.0000 mg | Freq: Once | INTRAVENOUS | Status: AC
  Administered 2015-11-17: 5 mg via INTRAVENOUS
  Filled 2015-11-17: qty 0.5

## 2015-11-17 MED ORDER — SODIUM CHLORIDE 0.9 % IV SOLN
80.0000 mg/m2 | Freq: Once | INTRAVENOUS | Status: AC
  Administered 2015-11-17: 150 mg via INTRAVENOUS
  Filled 2015-11-17: qty 7.5

## 2015-11-17 MED ORDER — HEPARIN SOD (PORK) LOCK FLUSH 100 UNIT/ML IV SOLN
500.0000 [IU] | Freq: Once | INTRAVENOUS | Status: AC | PRN
  Administered 2015-11-17: 500 [IU]
  Filled 2015-11-17: qty 5

## 2015-11-17 MED ORDER — SODIUM CHLORIDE 0.9% FLUSH
10.0000 mL | INTRAVENOUS | Status: DC | PRN
  Administered 2015-11-17: 10 mL
  Filled 2015-11-17: qty 10

## 2015-11-17 NOTE — Telephone Encounter (Signed)
per pof to sch pt appt-cld & spoke topt and gave pt time & date for 7/18appt '@1'$ :45

## 2015-11-17 NOTE — Telephone Encounter (Signed)
appt for moved to later in the afternoon per patient request

## 2015-11-17 NOTE — Patient Instructions (Signed)
[facility] Discharge Instructions for Patients Receiving Chemotherapy  Today you received the following chemotherapy agents Etoposide.   To help prevent nausea and vomiting after your treatment, we encourage you to take your nausea medication as prescribed.   If you develop nausea and vomiting that is not controlled by your nausea medication, call the clinic.   BELOW ARE SYMPTOMS THAT SHOULD BE REPORTED IMMEDIATELY:  *FEVER GREATER THAN 100.5 F  *CHILLS WITH OR WITHOUT FEVER  NAUSEA AND VOMITING THAT IS NOT CONTROLLED WITH YOUR NAUSEA MEDICATION  *UNUSUAL SHORTNESS OF BREATH  *UNUSUAL BRUISING OR BLEEDING  TENDERNESS IN MOUTH AND THROAT WITH OR WITHOUT PRESENCE OF ULCERS  *URINARY PROBLEMS  *BOWEL PROBLEMS  UNUSUAL RASH Items with * indicate a potential emergency and should be followed up as soon as possible.  Feel free to call the clinic you have any questions or concerns. The clinic phone number is [phone].  Please show the CHEMO ALERT CARD at check-in to the Emergency Department and triage nurse.   

## 2015-11-18 ENCOUNTER — Other Ambulatory Visit: Payer: Self-pay | Admitting: *Deleted

## 2015-11-18 ENCOUNTER — Ambulatory Visit (HOSPITAL_BASED_OUTPATIENT_CLINIC_OR_DEPARTMENT_OTHER): Payer: Medicare Other

## 2015-11-18 ENCOUNTER — Ambulatory Visit (HOSPITAL_BASED_OUTPATIENT_CLINIC_OR_DEPARTMENT_OTHER): Payer: Medicare Other | Admitting: Nurse Practitioner

## 2015-11-18 VITALS — BP 158/88 | HR 50 | Temp 98.3°F | Resp 18

## 2015-11-18 DIAGNOSIS — Z5111 Encounter for antineoplastic chemotherapy: Secondary | ICD-10-CM

## 2015-11-18 DIAGNOSIS — C3491 Malignant neoplasm of unspecified part of right bronchus or lung: Secondary | ICD-10-CM

## 2015-11-18 DIAGNOSIS — C7931 Secondary malignant neoplasm of brain: Secondary | ICD-10-CM

## 2015-11-18 DIAGNOSIS — N390 Urinary tract infection, site not specified: Secondary | ICD-10-CM

## 2015-11-18 DIAGNOSIS — R319 Hematuria, unspecified: Principal | ICD-10-CM

## 2015-11-18 LAB — URINALYSIS, MICROSCOPIC - CHCC
BILIRUBIN (URINE): NEGATIVE
Glucose: NEGATIVE mg/dL
KETONES: NEGATIVE mg/dL
Nitrite: NEGATIVE
PH: 6.5 (ref 4.6–8.0)
Protein: 100 mg/dL
SPECIFIC GRAVITY, URINE: 1.015 (ref 1.003–1.035)
Urobilinogen, UR: 0.2 mg/dL (ref 0.2–1)

## 2015-11-18 MED ORDER — CIPROFLOXACIN HCL 500 MG PO TABS
500.0000 mg | ORAL_TABLET | Freq: Two times a day (BID) | ORAL | Status: DC
Start: 2015-11-18 — End: 2015-12-28

## 2015-11-18 MED ORDER — DEXAMETHASONE SODIUM PHOSPHATE 100 MG/10ML IJ SOLN
5.0000 mg | Freq: Once | INTRAMUSCULAR | Status: AC
  Administered 2015-11-18: 5 mg via INTRAVENOUS
  Filled 2015-11-18: qty 0.5

## 2015-11-18 MED ORDER — SODIUM CHLORIDE 0.9 % IV SOLN
Freq: Once | INTRAVENOUS | Status: AC
  Administered 2015-11-18: 16:00:00 via INTRAVENOUS

## 2015-11-18 MED ORDER — SODIUM CHLORIDE 0.9% FLUSH
10.0000 mL | INTRAVENOUS | Status: DC | PRN
  Administered 2015-11-18: 10 mL
  Filled 2015-11-18: qty 10

## 2015-11-18 MED ORDER — SODIUM CHLORIDE 0.9 % IV SOLN
80.0000 mg/m2 | Freq: Once | INTRAVENOUS | Status: AC
  Administered 2015-11-18: 150 mg via INTRAVENOUS
  Filled 2015-11-18: qty 7.5

## 2015-11-18 MED ORDER — HEPARIN SOD (PORK) LOCK FLUSH 100 UNIT/ML IV SOLN
500.0000 [IU] | Freq: Once | INTRAVENOUS | Status: AC | PRN
  Administered 2015-11-18: 500 [IU]
  Filled 2015-11-18: qty 5

## 2015-11-18 NOTE — Patient Instructions (Signed)
[facility] Discharge Instructions for Patients Receiving Chemotherapy  Today you received the following chemotherapy agents: Etoposide   To help prevent nausea and vomiting after your treatment, we encourage you to take your nausea medication as directed.    If you develop nausea and vomiting that is not controlled by your nausea medication, call the clinic.   BELOW ARE SYMPTOMS THAT SHOULD BE REPORTED IMMEDIATELY:  *FEVER GREATER THAN 100.5 F  *CHILLS WITH OR WITHOUT FEVER  NAUSEA AND VOMITING THAT IS NOT CONTROLLED WITH YOUR NAUSEA MEDICATION  *UNUSUAL SHORTNESS OF BREATH  *UNUSUAL BRUISING OR BLEEDING  TENDERNESS IN MOUTH AND THROAT WITH OR WITHOUT PRESENCE OF ULCERS  *URINARY PROBLEMS  *BOWEL PROBLEMS  UNUSUAL RASH Items with * indicate a potential emergency and should be followed up as soon as possible.  Feel free to call the clinic you have any questions or concerns. The clinic phone number is [phone].  Please show the CHEMO ALERT CARD at check-in to the Emergency Department and triage nurse.   

## 2015-11-19 ENCOUNTER — Ambulatory Visit (HOSPITAL_BASED_OUTPATIENT_CLINIC_OR_DEPARTMENT_OTHER): Payer: Medicare Other

## 2015-11-19 ENCOUNTER — Telehealth: Payer: Self-pay | Admitting: Oncology

## 2015-11-19 ENCOUNTER — Encounter: Payer: Self-pay | Admitting: Nurse Practitioner

## 2015-11-19 ENCOUNTER — Telehealth: Payer: Self-pay | Admitting: *Deleted

## 2015-11-19 VITALS — BP 145/70 | HR 54 | Temp 98.9°F | Resp 16

## 2015-11-19 DIAGNOSIS — N390 Urinary tract infection, site not specified: Secondary | ICD-10-CM | POA: Insufficient documentation

## 2015-11-19 DIAGNOSIS — C3491 Malignant neoplasm of unspecified part of right bronchus or lung: Secondary | ICD-10-CM | POA: Diagnosis not present

## 2015-11-19 DIAGNOSIS — C7931 Secondary malignant neoplasm of brain: Secondary | ICD-10-CM

## 2015-11-19 DIAGNOSIS — Z5189 Encounter for other specified aftercare: Secondary | ICD-10-CM

## 2015-11-19 MED ORDER — PEGFILGRASTIM INJECTION 6 MG/0.6ML ~~LOC~~
6.0000 mg | PREFILLED_SYRINGE | Freq: Once | SUBCUTANEOUS | Status: AC
  Administered 2015-11-19: 6 mg via SUBCUTANEOUS
  Filled 2015-11-19: qty 0.6

## 2015-11-19 NOTE — Assessment & Plan Note (Signed)
Patient initiated cycle 4, day 1 of her carboplatin/etoposide chemotherapy regimen on 11/16/2015.  She returned today for cycle 4, day 3 of etoposide only portion of her chemotherapy.  She is scheduled to return tomorrow for her Neulasta injection.  She is scheduled to return on 12/28/2015 for labs, visit, and her next cycle of chemotherapy.

## 2015-11-19 NOTE — Telephone Encounter (Signed)
R/s appt per 6/22 pof. Updated calendar sent to patient by mail

## 2015-11-19 NOTE — Telephone Encounter (Signed)
Call received from patient questioning her time of injection appt for today.  Patient unable to come in for injection on Saturday d/t transportation issues.  Injection for today rescheduled for 5:30PM.  Pt also requesting that MRI that is scheduled for 11/23/15 be changed to 11/24/15.  Will notify injection and central scheduling.

## 2015-11-19 NOTE — Assessment & Plan Note (Signed)
Patient states that she has had mild dysuria and increased frequency and urgency for last few days.  She denies any recent fevers or chills.   Exam today revealed.  Patient in no acute distress and no flank pain.  On exam.  Urinalysis obtained today revealed:  Negative    Bilirubin (Urine) Negative  Negative   Ketones Negative mg/dL Negative   Specific Gravity, Urine 1.003 - 1.035  1.015   Blood Negative  Large   pH 4.6 - 8.0  6.5   Protein Negative- <30 mg/dL 100   Urobilinogen, UR 0.2 - 1 mg/dL 0.2   Nitrite Negative  Negative   Leukocyte Esterase Negative  Large   RBC / HPF 0 - 2  Field Obscured by WBCs   WBC, UA 0 - 2  Field Obscured by RBCs   COMMENTS: Negative  Field Obscured by RBCs & WBCs        Patient will be prescribed Cipro antibiotics for treatment of UTI.  She was advised to call/return or go directly to the emergency department for any worsening symptoms whatsoever.

## 2015-11-19 NOTE — Progress Notes (Signed)
SYMPTOM MANAGEMENT CLINIC    Chief Complaint: UTI  HPI:  Margaret Hale 81 y.o. female diagnosed with lung cancer with brain metastasis.  Currently undergoing carboplatin/etoposide chemotherapy regimen.  Patient persisted cancer Center today with UTI symptoms.    No history exists.    Review of Systems  Genitourinary: Positive for dysuria, urgency and frequency. Negative for hematuria and flank pain.  All other systems reviewed and are negative.   Past Medical History  Diagnosis Date  . Hyperlipidemia   . CAD (coronary artery disease) 2007    PCI of RCA, bare metal stent.....Dr. Radford Pax  . Thyroid disease     nodules,stable on u/s 2/12  . Baker's cyst     L KNEE  . Vitamin D deficiency   . Disc disorder of lumbar region     2 BULDGING DISCS DX 1990 BY DR. DALLDORF  . No pertinent past medical history     lung mass  occas cough   . Dizzy   . Hypertension     Takes Novasc ,Clonidine, & Losartan  . Cancer (HCC)     lung mass  . Anxiety     Past Surgical History  Procedure Laterality Date  . Carpal tunnel release  06/26/08        04/20/09    RIGHT HAND          LEFT HAND  . Finger contracture release  8/12    Dr. Amedeo Plenty    right hand trigger finger release  . Eye surgery  01/07/09    LEFT CATARACT  . Eye surgery  01/21/09    RIGHT CATARACT  . Coronary angioplasty      dr h Tamala Julian [phone] with one stent  . Mediastinoscopy  08/02/2011    Procedure: MEDIASTINOSCOPY;  Surgeon: Nicanor Alcon, MD;  Location: [facility] OR;  Service: Thoracic;  Laterality: N/A;  . Craniotomy Right 06/09/2015    Procedure: CRANIOTOMY TUMOR EXCISION WITH APPLICATION OF CRANIAL NAVIGATION;  Surgeon: Newman Pies, MD;  Location: [facility];  Service: Neurosurgery;  Laterality: Right;  Right temporal craniotomy for resection of tumor    has CAD (coronary artery disease); Thyroid disease; Baker's cyst; Vitamin D deficiency; Disc disorder of lumbar region; Pulmonary nodules; COPD (chronic  obstructive pulmonary disease) ([place]); Benign essential HTN; Dyslipidemia; Chest pain; Restless leg syndrome; S/P craniotomy; Brain metastasis ([place]); Small cell carcinoma of right lung ([place]); Port catheter in place; and UTI (urinary tract infection) on her problem list.    is allergic to food; lipitor; tape; and pantoprazole sodium.    Medication List       This list is accurate as of: 11/18/15 11:59 PM.  Always use your most recent med list.               amLODipine 10 MG tablet  Commonly known as:  NORVASC  Take 10 mg by mouth every evening.     aspirin 81 MG tablet  Take 81 mg by mouth daily.     ciprofloxacin 500 MG tablet  Commonly known as:  CIPRO  Take 1 tablet (500 mg total) by mouth 2 (two) times daily.     FISH OIL PO  Take 4,000 Units by mouth 2 (two) times daily.     hydrochlorothiazide 25 MG tablet  Commonly known as:  HYDRODIURIL  Take 25 mg by mouth daily.     lidocaine-prilocaine cream  Commonly known as:  EMLA  Apply 1 application topically as needed. Apply to port  site 1 hr prior to port access and cover with plastic wrap     metoprolol succinate 100 MG 24 hr tablet  Commonly known as:  TOPROL-XL  Take 100 mg by mouth daily. Take with or immediately following a meal.     ondansetron 8 MG tablet  Commonly known as:  ZOFRAN  Take 1 tablet (8 mg total) by mouth every 8 (eight) hours as needed for nausea or vomiting.     prochlorperazine 10 MG tablet  Commonly known as:  COMPAZINE  Take 0.5 tablets (5 mg total) by mouth every 6 (six) hours as needed for nausea or vomiting.     rOPINIRole 1 MG tablet  Commonly known as:  REQUIP  Take 1 mg by mouth at bedtime.     valsartan 320 MG tablet  Commonly known as:  DIOVAN  Take 320 mg by mouth daily.         PHYSICAL EXAMINATION  Oncology Vitals 11/18/2015 11/17/2015  Height - -  Weight - -  Weight (lbs) - -  BMI (kg/m2) - -  Temp 98.3 97.6  Pulse 50 65  Resp 18 20  SpO2 98 99  BSA (m2) - -    BP Readings from Last 2 Encounters:  11/18/15 158/88  11/17/15 160/64    Physical Exam  Constitutional: She is oriented to person, place, and time and well-developed, well-nourished, and in no distress.  HENT:  Head: Normocephalic and atraumatic.  Eyes: Conjunctivae and EOM are normal. Pupils are equal, round, and reactive to light. Right eye exhibits no discharge. Left eye exhibits no discharge. No scleral icterus.  Neck: Normal range of motion.  Pulmonary/Chest: Effort normal. No respiratory distress.  Musculoskeletal: Normal range of motion.  Neurological: She is alert and oriented to person, place, and time.  Skin: Skin is warm and dry.  Psychiatric: Affect normal.  Nursing note and vitals reviewed.   LABORATORY DATA:. Appointment on 11/18/2015  Component Date Value Ref Range Status  . Glucose 11/18/2015 Negative  Negative mg/dL Final  . Bilirubin (Urine) 11/18/2015 Negative  Negative Final  . Ketones 11/18/2015 Negative  Negative mg/dL Final  . Specific Gravity, Urine 11/18/2015 1.015  1.003 - 1.035 Final  . Blood 11/18/2015 Large  Negative Final  . pH 11/18/2015 6.5  4.6 - 8.0 Final  . Protein 11/18/2015 100  Negative- <30 mg/dL Final  . Urobilinogen, UR 11/18/2015 0.2  0.2 - 1 mg/dL Final  . Nitrite 11/18/2015 Negative  Negative Final  . Leukocyte Esterase 11/18/2015 Large  Negative Final  . RBC / HPF 11/18/2015 Field Obscured by WBCs  0 - 2 Final  . WBC, UA 11/18/2015 Field Obscured by RBCs  0 - 2 Final  . COMMENTS: 11/18/2015 Field Obscured by RBCs & WBCs  Negative Final  Appointment on 11/16/2015  Component Date Value Ref Range Status  . WBC 11/16/2015 5.6  3.9 - 10.3 10e3/uL Final  . NEUT# 11/16/2015 4.0  1.5 - 6.5 10e3/uL Final  . HGB 11/16/2015 13.9  11.6 - 15.9 g/dL Final  . HCT 11/16/2015 41.9  34.8 - 46.6 % Final  . Platelets 11/16/2015 223  145 - 400 10e3/uL Final  . MCV 11/16/2015 91.0  79.5 - 101.0 fL Final  . MCH 11/16/2015 30.1  25.1 - 34.0 pg Final   . MCHC 11/16/2015 33.1  31.5 - 36.0 g/dL Final  . RBC 11/16/2015 4.61  3.70 - 5.45 10e6/uL Final  . RDW 11/16/2015 15.0* 11.2 - 14.5 % Final  .  lymph# 11/16/2015 0.9  0.9 - 3.3 10e3/uL Final  . MONO# 11/16/2015 0.6  0.1 - 0.9 10e3/uL Final  . Eosinophils Absolute 11/16/2015 0.1  0.0 - 0.5 10e3/uL Final  . Basophils Absolute 11/16/2015 0.0  0.0 - 0.1 10e3/uL Final  . NEUT% 11/16/2015 71.1  38.4 - 76.8 % Final  . LYMPH% 11/16/2015 15.6  14.0 - 49.7 % Final  . MONO% 11/16/2015 10.6  0.0 - 14.0 % Final  . EOS% 11/16/2015 2.1  0.0 - 7.0 % Final  . BASO% 11/16/2015 0.6  0.0 - 2.0 % Final  . Sodium 11/16/2015 142  136 - 145 mEq/L Final  . Potassium 11/16/2015 3.9  3.5 - 5.1 mEq/L Final  . Chloride 11/16/2015 104  98 - 109 mEq/L Final  . CO2 11/16/2015 29  22 - 29 mEq/L Final  . Glucose 11/16/2015 103  70 - 140 mg/dl Final   Glucose reference range is for nonfasting patients. Fasting glucose reference range is 70- 100.  Marland Kitchen BUN 11/16/2015 17.6  7.0 - 26.0 mg/dL Final  . Creatinine 11/16/2015 0.8  0.6 - 1.1 mg/dL Final  . Total Bilirubin 11/16/2015 0.50  0.20 - 1.20 mg/dL Final  . Alkaline Phosphatase 11/16/2015 88  40 - 150 U/L Final  . AST 11/16/2015 15  5 - 34 U/L Final  . ALT 11/16/2015 15  0 - 55 U/L Final  . Total Protein 11/16/2015 7.1  6.4 - 8.3 g/dL Final  . Albumin 11/16/2015 3.7  3.5 - 5.0 g/dL Final  . Calcium 11/16/2015 9.5  8.4 - 10.4 mg/dL Final  . Anion Gap 11/16/2015 8  3 - 11 mEq/L Final  . EGFR 11/16/2015 72* >90 ml/min/1.73 m2 Final   eGFR is calculated using the CKD-EPI Creatinine Equation (2009)    RADIOGRAPHIC STUDIES: No results found.  ASSESSMENT/PLAN:    UTI (urinary tract infection) Patient states that she has had mild dysuria and increased frequency and urgency for last few days.  She denies any recent fevers or chills.   Exam today revealed.  Patient in no acute distress and no flank pain.  On exam.  Urinalysis obtained today revealed:  Negative     Bilirubin (Urine) Negative  Negative   Ketones Negative mg/dL Negative   Specific Gravity, Urine 1.003 - 1.035  1.015   Blood Negative  Large   pH 4.6 - 8.0  6.5   Protein Negative- <30 mg/dL 100   Urobilinogen, UR 0.2 - 1 mg/dL 0.2   Nitrite Negative  Negative   Leukocyte Esterase Negative  Large   RBC / HPF 0 - 2  Field Obscured by WBCs   WBC, UA 0 - 2  Field Obscured by RBCs   COMMENTS: Negative  Field Obscured by RBCs & WBCs        Patient will be prescribed Cipro antibiotics for treatment of UTI.  She was advised to call/return or go directly to the emergency department for any worsening symptoms whatsoever.   Small cell carcinoma of right lung ([place]) Patient initiated cycle 4, day 1 of her carboplatin/etoposide chemotherapy regimen on 11/16/2015.  She returned today for cycle 4, day 3 of etoposide only portion of her chemotherapy.  She is scheduled to return tomorrow for her Neulasta injection.  She is scheduled to return on 12/28/2015 for labs, visit, and her next cycle of chemotherapy.   Patient stated understanding of all instructions; and was in agreement with this plan of care. The patient knows to call the clinic  with any problems, questions or concerns.   Total time spent with patient was 15 minutes;  with greater than 75 percent of that time spent in face to face counseling regarding patient's symptoms,  and coordination of care and follow up.  Disclaimer:This dictation was prepared with Dragon/digital dictation along with [employer]. Any transcriptional errors that result from this process are unintentional.  Drue Second, NP 11/19/2015

## 2015-11-19 NOTE — Telephone Encounter (Signed)
Per central scheduling appointment available on 11/24/15 at 7:00PM.  Patient agreeable to time and is requesting that appt with Dr. Lisbeth Renshaw be canceled on 11/24/15.  Scheduler in radiation notified of patients request.

## 2015-11-20 LAB — URINE CULTURE

## 2015-11-23 ENCOUNTER — Ambulatory Visit (HOSPITAL_COMMUNITY): Admission: RE | Admit: 2015-11-23 | Payer: Medicare Other | Source: Ambulatory Visit

## 2015-11-23 NOTE — Addendum Note (Signed)
Encounter addended by: Kyung Rudd, MD on: 11/23/2015  8:02 PM<BR>     Documentation filed: Notes Section

## 2015-11-23 NOTE — Progress Notes (Signed)
  Radiation Oncology         ([phone]) [phone] ________________________________  Name: Margaret Hale MRN: [id number]  Date: 07/14/2015  DOB: 1935/03/26    Simulation and treatment planning note  DIAGNOSIS:     ICD-9-CM ICD-10-CM   1. Non-small cell carcinoma of lung, stage 4, right (HCC) 162.9 C34.91   2. Brain metastasis ([place]) 198.3 C79.31      The patient presented for simulation for the patient's upcoming course of whole brain radiation treatment. The patient was placed in a supine position and a customized thermoplastic head cast was constructed to aid in patient immobilization during the treatment. This complex treatment device will be used on a daily basis. In this fashion a CT scan was obtained through the head and neck region and isocenter was placed near midline within the brain.  The patient will be planned to receive a course of whole brain radiation treatment to a dose of 30 gray in 10 fractions at 3 gray per fraction. To accomplish this, 2 customized blocks have been designed which corresponds to left and right whole brain radiation fields. These 2 complex treatment devices will be used on a daily basis during the course of radiation. A complex isodose plan is requested to insure that the target area is adequately covered in to facilitate optimization of the treatment plan. A forward planning technique will also be evaluated to determine if this approach significantly improves the plan.   ________________________________   Jodelle Gross, MD, PhD

## 2015-11-24 ENCOUNTER — Ambulatory Visit (HOSPITAL_COMMUNITY)
Admission: RE | Admit: 2015-11-24 | Discharge: 2015-11-24 | Disposition: A | Discharge status: Home / Self Care | Payer: Medicare Other | Source: Ambulatory Visit | Attending: Radiation Oncology | Admitting: Radiation Oncology

## 2015-11-24 ENCOUNTER — Ambulatory Visit: Admission: RE | Admit: 2015-11-24 | Payer: Medicare Other | Source: Ambulatory Visit | Admitting: Radiation Oncology

## 2015-11-24 DIAGNOSIS — C7949 Secondary malignant neoplasm of other parts of nervous system: Secondary | ICD-10-CM | POA: Diagnosis present

## 2015-11-24 DIAGNOSIS — Z9889 Other specified postprocedural states: Secondary | ICD-10-CM | POA: Insufficient documentation

## 2015-11-24 DIAGNOSIS — C7931 Secondary malignant neoplasm of brain: Secondary | ICD-10-CM | POA: Diagnosis not present

## 2015-11-24 MED ORDER — GADOBENATE DIMEGLUMINE 529 MG/ML IV SOLN
20.0000 mL | Freq: Once | INTRAVENOUS | Status: AC | PRN
  Administered 2015-11-24: 17 mL via INTRAVENOUS

## 2015-11-26 ENCOUNTER — Telehealth: Payer: Self-pay | Admitting: *Deleted

## 2015-11-26 NOTE — Telephone Encounter (Signed)
Called pt with MRI result, per Dr. Gearldine Shown note below. Pt voiced understanding.

## 2015-11-26 NOTE — Telephone Encounter (Signed)
-----   Message from Ladell Pier, MD sent at 11/25/2015  9:09 PM EDT ----- Please call patient, mri shows no new brain lesion, enhancement at surgical cavity likely evolving post operative change, will repeat in 4-6 months, f/u as scheduled

## 2015-12-14 ENCOUNTER — Ambulatory Visit: Payer: Medicare Other

## 2015-12-14 ENCOUNTER — Other Ambulatory Visit: Payer: Medicare Other

## 2015-12-14 ENCOUNTER — Ambulatory Visit: Payer: Medicare Other | Admitting: Nurse Practitioner

## 2015-12-15 ENCOUNTER — Ambulatory Visit: Payer: Medicare Other

## 2015-12-16 ENCOUNTER — Ambulatory Visit: Payer: Medicare Other

## 2015-12-17 ENCOUNTER — Ambulatory Visit: Payer: Medicare Other

## 2015-12-27 ENCOUNTER — Telehealth: Payer: Self-pay | Admitting: *Deleted

## 2015-12-27 NOTE — Telephone Encounter (Signed)
Message from pt calling to confirm appointments. She received a message that appts were changed to 8/7. Returned call, confirmed 8/1-8/7 appts.

## 2015-12-28 ENCOUNTER — Other Ambulatory Visit: Payer: Self-pay | Admitting: *Deleted

## 2015-12-28 ENCOUNTER — Ambulatory Visit: Payer: Medicare Other

## 2015-12-28 ENCOUNTER — Other Ambulatory Visit (HOSPITAL_BASED_OUTPATIENT_CLINIC_OR_DEPARTMENT_OTHER): Payer: Medicare Other

## 2015-12-28 ENCOUNTER — Telehealth: Payer: Self-pay | Admitting: Oncology

## 2015-12-28 ENCOUNTER — Encounter: Payer: Self-pay | Admitting: *Deleted

## 2015-12-28 ENCOUNTER — Ambulatory Visit (HOSPITAL_BASED_OUTPATIENT_CLINIC_OR_DEPARTMENT_OTHER): Payer: Medicare Other | Admitting: Oncology

## 2015-12-28 VITALS — BP 136/60 | HR 53 | Temp 98.0°F | Resp 18 | Ht 64.0 in | Wt 184.9 lb

## 2015-12-28 DIAGNOSIS — C7931 Secondary malignant neoplasm of brain: Secondary | ICD-10-CM | POA: Diagnosis not present

## 2015-12-28 DIAGNOSIS — C3491 Malignant neoplasm of unspecified part of right bronchus or lung: Secondary | ICD-10-CM

## 2015-12-28 DIAGNOSIS — D591 Other autoimmune hemolytic anemias: Principal | ICD-10-CM

## 2015-12-28 DIAGNOSIS — L988 Other specified disorders of the skin and subcutaneous tissue: Secondary | ICD-10-CM

## 2015-12-28 DIAGNOSIS — I10 Essential (primary) hypertension: Secondary | ICD-10-CM

## 2015-12-28 DIAGNOSIS — D5919 Other autoimmune hemolytic anemia: Secondary | ICD-10-CM

## 2015-12-28 LAB — CBC WITH DIFFERENTIAL/PLATELET
BASO%: 1.2 % (ref 0.0–2.0)
BASOS ABS: 0.1 10*3/uL (ref 0.0–0.1)
EOS ABS: 0.3 10*3/uL (ref 0.0–0.5)
EOS%: 6.2 % (ref 0.0–7.0)
HEMATOCRIT: 42.2 % (ref 34.8–46.6)
HEMOGLOBIN: 13.9 g/dL (ref 11.6–15.9)
LYMPH#: 1.1 10*3/uL (ref 0.9–3.3)
LYMPH%: 20.5 % (ref 14.0–49.7)
MCH: 30.1 pg (ref 25.1–34.0)
MCHC: 33 g/dL (ref 31.5–36.0)
MCV: 91.1 fL (ref 79.5–101.0)
MONO#: 0.7 10*3/uL (ref 0.1–0.9)
MONO%: 13.4 % (ref 0.0–14.0)
NEUT#: 3.1 10*3/uL (ref 1.5–6.5)
NEUT%: 58.7 % (ref 38.4–76.8)
Platelets: 155 10*3/uL (ref 145–400)
RBC: 4.63 10*6/uL (ref 3.70–5.45)
RDW: 14.3 % (ref 11.2–14.5)
WBC: 5.2 10*3/uL (ref 3.9–10.3)

## 2015-12-28 LAB — COMPREHENSIVE METABOLIC PANEL
ALBUMIN: 3.9 g/dL (ref 3.5–5.0)
ALK PHOS: 110 U/L (ref 40–150)
ALT: 23 U/L (ref 0–55)
AST: 20 U/L (ref 5–34)
Anion Gap: 9 mEq/L (ref 3–11)
BUN: 26.4 mg/dL — AB (ref 7.0–26.0)
CALCIUM: 9.7 mg/dL (ref 8.4–10.4)
CO2: 28 mEq/L (ref 22–29)
Chloride: 104 mEq/L (ref 98–109)
Creatinine: 0.8 mg/dL (ref 0.6–1.1)
EGFR: 68 mL/min/{1.73_m2} — ABNORMAL LOW (ref >90)
Glucose: 97 mg/dl (ref 70–140)
POTASSIUM: 3.9 meq/L (ref 3.5–5.1)
Sodium: 141 mEq/L (ref 136–145)
Total Bilirubin: 0.44 mg/dL (ref 0.20–1.20)
Total Protein: 7.1 g/dL (ref 6.4–8.3)

## 2015-12-28 MED ORDER — SODIUM CHLORIDE 0.9 % IJ SOLN
10.0000 mL | INTRAMUSCULAR | Status: DC | PRN
  Administered 2015-12-28: 10 mL via INTRAVENOUS
  Filled 2015-12-28: qty 10

## 2015-12-28 MED ORDER — HEPARIN SOD (PORK) LOCK FLUSH 100 UNIT/ML IV SOLN
500.0000 [IU] | Freq: Once | INTRAVENOUS | Status: AC | PRN
  Administered 2015-12-28: 500 [IU] via INTRAVENOUS
  Filled 2015-12-28: qty 5

## 2015-12-28 NOTE — Progress Notes (Signed)
  [facility] OFFICE PROGRESS NOTE   Diagnosis: Small cell lung cancer  INTERVAL HISTORY:   Ms.Sindalaru returns as scheduled. She feels well. She returned from a trip to [place]. She has noted improvement in her cough. She complains of discomfort at the medial aspect of the dorsum of the left hand. No weakness. She has pruritic skin lesions over the trunk and extremities.   Objective:  Vital signs in last 24 hours:  Blood pressure 136/60, pulse (!) 53, temperature 98 F (36.7 C), temperature source Oral, resp. rate 18, height '5\' 4"'$  (1.626 m), weight 184 lb 14.4 oz (83.9 kg), SpO2 99 %.    HEENT:  neck without mass Lymphatics:  no cervical, supraclavicular, or axillary nodes Resp:  lungs clear bilaterally Cardio:  regular rate and rhythm GI:  no hepatomegaly Vascular:  no leg edema Musculoskeletal: Examination of the left hand is unremarkable  skin: Isolated erythematous lesions over the trunk and extremities, some have superficial ulceration , no vesicles  Portacath/PICC-without erythema  Lab Results:  Lab Results  Component Value Date   WBC 5.2 12/28/2015   HGB 13.9 12/28/2015   HCT 42.2 12/28/2015   MCV 91.1 12/28/2015   PLT 155 12/28/2015   NEUTROABS 3.1 12/28/2015     Medications: I have reviewed the patient's current medications.  Assessment/Plan: 1. Extensive stage small cell lung cancer ? resection of right temporal brain mass 06/09/2015 confirmed metastatic small cell carcinoma ? Staging PET scan at [facility] 07/22/2015 revealed decreased activity associated with previously seen pulmonary nodules , decreased activity of hypermetabolic mediastinal and bilateral hilar nodes , no new sites of FDG avid metastatic disease compared to a [facility] PET scan from November 2016  Cycle 1 etoposide/carboplatin 08/16/2015  Cycle 2 etoposide/carboplatin 09/14/2015  Cycle 3 etoposide/carboplatin 10/19/2015  Restaging chest CT 11/12/2015 with generally  stable appearance of solid, sub-solid and ground glass pulmonary nodules bilaterally. Stable mildly prominent mediastinal lymph nodes. No progressive adenopathy or new findings.  Cycle 4 etoposide/carboplatin 11/16/2015   2. Chronic history of mediastinal lymphadenopathy and pulmonary nodules- diagnostic workup in 2013 including bronchoscopy/ EBUS , and mediastinoscopy -negative for malignancy, , granulomatous changes noted  3. Hypertension  4. History of CAD  5. "Sweats "-most likely related to post menopausal hot flashes  6. Port-A-Cath placement 10/26/2015    Disposition:   She appears stable. She has completed 4 cycles of etoposide/carboplatin. There is no clinical evidence of disease progression. Restaging CTs after 3 cycles revealed no evidence of disease progression. She declines brain radiation.  We discussed treatment plans again today. She has been maintained off of systemic therapy for the past 6 weeks. She is eligible for the [employer] maintenance study. We discussed this study and she met with a research nurse today. She decided against enrollment on the clinical trial.  The benefit of further systemic chemotherapy in her case is unclear. We decided to follow her with observation. She is comfortable with observation.  The Port-A-Cath was flushed today. She will return for an office visit and Port-A-Cath flush in 6 weeks. We will plan for a restaging CT evaluation within the next 3-4 months.  Betsy Coder, MD  12/28/2015  10:53 AM

## 2015-12-28 NOTE — Patient Instructions (Signed)
Heparin injection What is this medicine? HEPARIN (HEP a rin) is an anticoagulant. It is used to treat or prevent clots in the veins, arteries, lungs, or heart. It stops clots from forming or getting bigger. This medicine prevents clotting during open-heart surgery, dialysis, or in patients who are confined to bed. This medicine may be used for other purposes; ask your health care provider or pharmacist if you have questions. What should I tell my health care provider before I take this medicine? They need to know if you have any of these conditions: -bleeding disorders, such as hemophilia or low blood platelets -bowel disease or diverticulitis -endocarditis -high blood pressure -liver disease -recent surgery or delivery of a baby -stomach ulcers -an unusual or allergic reaction to heparin, benzyl alcohol, sulfites, other medicines, foods, dyes, or preservatives -pregnant or trying to get pregnant -breast-feeding How should I use this medicine? This medicine is given by injection or infusion into a vein. It can also be given by injection of small amounts under the skin. It is usually given by a health care professional in a hospital or clinic setting. If you get this medicine at home, you will be taught how to prepare and give this medicine. Use exactly as directed. Take your medicine at regular intervals. Do not take it more often than directed. Do not stop taking except on your doctor's advice. Stopping this medicine may increase your risk of a blot clot. Be sure to refill your prescription before you run out of medicine. It is important that you put your used needles and syringes in a special sharps container. Do not put them in a trash can. If you do not have a sharps container, call your pharmacist or healthcare provider to get one. Talk to your pediatrician regarding the use of this medicine in children. While this medicine may be prescribed for children for selected conditions, precautions  do apply. Overdosage: If you think you have taken too much of this medicine contact a poison control center or emergency room at once. NOTE: This medicine is only for you. Do not share this medicine with others. What if I miss a dose? If you miss a dose, take it as soon as you can. If it is almost time for your next dose, take only that dose. Do not take double or extra doses. What may interact with this medicine? Do not take this medicine with any of the following medications: -aspirin and aspirin-like drugs -mifepristone -medicines that treat or prevent blood clots like warfarin, enoxaparin, and dalteparin -palifermin -protamine This medicine may also interact with the following medications: -dextran -digoxin -hydroxychloroquine -medicines for treating colds or allergies -nicotine -NSAIDs, medicines for pain and inflammation, like ibuprofen or naproxen -phenylbutazone -tetracycline antibiotics This list may not describe all possible interactions. Give your health care provider a list of all the medicines, herbs, non-prescription drugs, or dietary supplements you use. Also tell them if you smoke, drink alcohol, or use illegal drugs. Some items may interact with your medicine. What should I watch for while using this medicine? While you are taking this medicine, carry an identification card with your name, the name and dose of medicine(s) being used, and the name and phone number of your doctor or health care professional or person to contact in an emergency. Notify your doctor or health care professional and seek emergency treatment if you develop breathing problems; changes in vision; chest pain; severe, sudden headache; pain, swelling, warmth in the leg; trouble speaking; sudden numbness or  weakness of the face, arm, or leg. These can be signs that your condition has gotten worse. Notify your doctor or health care professional at once if you have cold, blue hands or feet. If you are  going to have surgery or dental work, tell your doctor or health care professional that you have received this medicine. Be careful brushing and flossing your teeth or using a toothpick while receiving this medicine because you may bleed more easily. Avoid sports and activities that might cause injury while you are using this medicine. Severe falls or injuries can cause unseen bleeding. Be careful when using sharp tools or knives. Consider using an Copy. What side effects may I notice from receiving this medicine? Side effects that you should report to your doctor or health care professional as soon as possible: -allergic reactions like skin rash, itching or hives, swelling of the face, lips, or tongue -back pain -burning or itching on the bottoms of the feet -cold, blue, or painful hands and feet -feeling faint or lightheaded, falls -fever, chills -nausea, vomiting -signs and symptoms of bleeding such as bloody or black, tarry stools; red or dark-brown urine; spitting up blood or brown material that looks like coffee grounds; red spots on the skin; unusual bruising or bleeding from the eye, gums, or nose -stomach pain -unusually low blood pressure Side effects that usually do not require medical attention (report to your doctor or health care professional if they continue or are bothersome): -pain, redness, or irritation at site where injected This list may not describe all possible side effects. Call your doctor for medical advice about side effects. You may report side effects to FDA at 1-800-FDA-1088. Where should I keep my medicine? Keep out of the reach of children. Store unopened vials at room temperature between 15 and 30 degrees C (59 and 86 degrees F). Do not freeze. Do not use if solution is discolored or particulate matter is present. Throw away any unused medicine after the expiration date. NOTE: This sheet is a summary. It may not cover all possible information. If you  have questions about this medicine, talk to your doctor, pharmacist, or health care provider.    2016, Elsevier/Gold Standard. (2012-09-10 16:19:24)

## 2015-12-28 NOTE — Telephone Encounter (Signed)
Pt will get updated sched in tx room °

## 2015-12-29 ENCOUNTER — Other Ambulatory Visit: Payer: Self-pay | Admitting: Radiation Therapy

## 2015-12-29 ENCOUNTER — Ambulatory Visit: Payer: Medicare Other

## 2015-12-29 DIAGNOSIS — C7949 Secondary malignant neoplasm of other parts of nervous system: Principal | ICD-10-CM

## 2015-12-29 DIAGNOSIS — C7931 Secondary malignant neoplasm of brain: Secondary | ICD-10-CM

## 2015-12-30 ENCOUNTER — Ambulatory Visit: Payer: Medicare Other

## 2015-12-31 ENCOUNTER — Ambulatory Visit: Payer: Medicare Other

## 2015-12-31 NOTE — Progress Notes (Signed)
Injection not given. Pt did not receive  Chemotherapy infusion.

## 2016-01-05 ENCOUNTER — Encounter: Payer: Self-pay | Admitting: *Deleted

## 2016-01-05 NOTE — Progress Notes (Signed)
Pt in lobby requesting to speak with Dr. Gearldine Shown nurse.  Spoke with pt and she is concerned about her upcoming MRI scheduled for 01/24/16 and does not feel as though she needs it at this time.  Pt requested a copy of her MRI of brain results from 10/2015.  On reading impression of MRI, pt noted that it was recommended for her to have another MRI in 2-3 months.  Pt is now agreeable to MRI in August.  Schedule printed for pt and pt appreciative of assistance and has no further questions or concerns at this time.

## 2016-01-24 ENCOUNTER — Ambulatory Visit (HOSPITAL_COMMUNITY)
Admission: RE | Admit: 2016-01-24 | Discharge: 2016-01-24 | Disposition: A | Discharge status: Home / Self Care | Payer: Medicare Other | Source: Ambulatory Visit | Attending: Radiation Oncology | Admitting: Radiation Oncology

## 2016-01-24 DIAGNOSIS — C7931 Secondary malignant neoplasm of brain: Secondary | ICD-10-CM | POA: Diagnosis present

## 2016-01-24 DIAGNOSIS — C7949 Secondary malignant neoplasm of other parts of nervous system: Secondary | ICD-10-CM | POA: Insufficient documentation

## 2016-01-24 DIAGNOSIS — I739 Peripheral vascular disease, unspecified: Secondary | ICD-10-CM | POA: Diagnosis not present

## 2016-01-24 DIAGNOSIS — G319 Degenerative disease of nervous system, unspecified: Secondary | ICD-10-CM | POA: Insufficient documentation

## 2016-01-24 DIAGNOSIS — G9389 Other specified disorders of brain: Secondary | ICD-10-CM | POA: Insufficient documentation

## 2016-01-24 MED ORDER — GADOBENATE DIMEGLUMINE 529 MG/ML IV SOLN: 18.0000 mL | Freq: Once | INTRAVENOUS | Status: DC | PRN

## 2016-01-24 MED ORDER — HEPARIN SOD (PORK) LOCK FLUSH 100 UNIT/ML IV SOLN
500.0000 [IU] | INTRAVENOUS | Status: AC | PRN
  Administered 2016-01-24: 500 [IU]

## 2016-01-26 ENCOUNTER — Encounter: Payer: Self-pay | Admitting: Radiation Oncology

## 2016-01-26 ENCOUNTER — Ambulatory Visit
Admission: RE | Admit: 2016-01-26 | Discharge: 2016-01-26 | Disposition: A | Discharge status: Home / Self Care | Payer: Medicare Other | Source: Ambulatory Visit | Attending: Radiation Oncology | Admitting: Radiation Oncology

## 2016-01-26 ENCOUNTER — Other Ambulatory Visit: Payer: Self-pay | Admitting: *Deleted

## 2016-01-26 VITALS — BP 153/60 | HR 55 | Temp 97.8°F | Ht 64.0 in | Wt 188.4 lb

## 2016-01-26 DIAGNOSIS — Z7982 Long term (current) use of aspirin: Secondary | ICD-10-CM | POA: Insufficient documentation

## 2016-01-26 DIAGNOSIS — Z888 Allergy status to other drugs, medicaments and biological substances status: Secondary | ICD-10-CM | POA: Diagnosis not present

## 2016-01-26 DIAGNOSIS — C3490 Malignant neoplasm of unspecified part of unspecified bronchus or lung: Secondary | ICD-10-CM | POA: Diagnosis not present

## 2016-01-26 DIAGNOSIS — Z91018 Allergy to other foods: Secondary | ICD-10-CM | POA: Diagnosis not present

## 2016-01-26 DIAGNOSIS — Z9109 Other allergy status, other than to drugs and biological substances: Secondary | ICD-10-CM | POA: Insufficient documentation

## 2016-01-26 DIAGNOSIS — C7931 Secondary malignant neoplasm of brain: Secondary | ICD-10-CM | POA: Diagnosis present

## 2016-01-26 DIAGNOSIS — Z9221 Personal history of antineoplastic chemotherapy: Secondary | ICD-10-CM | POA: Diagnosis not present

## 2016-01-26 NOTE — Progress Notes (Signed)
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  Her recent MRI scan shows a new 4 mm metastasis in the midline superior vermis. Patient complains of 20 lb weight gain since the start of chemotherapy. She also notes extreme fatigue and decreased quality of life from the chemotherapy. She notes she is unable to remain as active as she would like to. She denies ataxia, blurred vision, nor headaches at this time.                           ALLERGIES:  is allergic to food; lipitor [atorvastatin calcium]; tape; and pantoprazole sodium.  Meds: Current Outpatient Prescriptions  Medication Sig Dispense Refill  . amLODipine (NORVASC) 10 MG tablet Take 10 mg by mouth every evening.     Marland Kitchen aspirin 81 MG tablet Take 81 mg by mouth daily.    . clonazePAM (KLONOPIN) 0.5 MG tablet Take 0.5 mg by mouth at bedtime as needed for sleep.    . hydrochlorothiazide (HYDRODIURIL) 25 MG tablet Take 25 mg by mouth daily.    Marland Kitchen lidocaine-prilocaine (EMLA) cream Apply 1 application topically as needed. Apply to port site 1 hr prior to port access and cover with plastic wrap 30 g 0  . metoprolol succinate (TOPROL-XL) 100 MG 24 hr tablet Take 100 mg by mouth daily. Take with or immediately following a meal.    . Omega-3 Fatty Acids (FISH OIL PO) Take 4,000 Units by mouth 2 (two) times daily.    Marland Kitchen rOPINIRole (REQUIP) 1 MG tablet Take 1 mg by mouth at bedtime.    . triamcinolone cream (KENALOG) 0.1 %     . valsartan (DIOVAN) 320 MG tablet Take 320 mg by mouth daily.     No current facility-administered medications for this encounter.     Physical Findings: The  patient is in no acute distress. Patient is alert and oriented.  height is '5\' 4"'$  (1.626 m) and weight is 188 lb 6.4 oz (85.5 kg). Her oral temperature is 97.8 F (36.6 C). Her blood pressure is 153/60 (abnormal) and her pulse is 55 (abnormal). Her oxygen saturation is 99%. .     Lab Findings: Lab Results  Component Value Date   WBC 5.2 12/28/2015   HGB 13.9 12/28/2015   HCT 42.2 12/28/2015   MCV 91.1 12/28/2015   PLT 155 12/28/2015     Radiographic Findings: Mr Jeri Cos QM Contrast  Result Date: 01/24/2016 CLINICAL DATA:  Lung cancer with RIGHT temporal brain metastasis, previously resected. Undergoing chemotherapy. EXAM: MRI HEAD WITHOUT AND WITH CONTRAST TECHNIQUE: Multiplanar, multiecho pulse sequences of the brain and surrounding structures were obtained without and with intravenous contrast. CONTRAST:  MultiHance 18 mL. COMPARISON:  Most recent prior MR 11/22/2015, also previous studies from 06/10/2015 and 06/08/2015. FINDINGS: The patient was unable to remain motionless for the exam. Small or subtle lesions could be overlooked. Continued regression of enhancement related to the resection site in the RIGHT temporal lobe. As seen on postcontrast T1 weighted images, the area of scarring/enhancement measures 11 x 8 x 3 mm (R-L x A-P  x C-C) as compared with 17 x 9 x 4 mm 2 months ago. Mild overlying dural enhancement is postoperative in nature. In the midline superior vermis, image 60 series 18, there is a new 4 mm metastasis. I favor this being intra-axial rather than leptomeningeal spread of tumor but cannot completely exclude the latter. Within limits for detection on this motion degraded exam, no other new lesions. No restricted diffusion, hemorrhage, focal mass lesion, or extra-axial fluid. Hydrocephalus ex vacuo. Extensive T2 and FLAIR hyperintensities throughout the white matter, favored to represent chronic microvascular ischemic change. Gliosis surrounds the surgical bed, with no definite  vasogenic edema. Tiny foci of chronic hemorrhage in the RIGHT temporal surgical bed have also shown central involution/collapse. Tiny focus chronic hemorrhage LEFT temporal lobe, stable but nonspecific, likely sequelae of hypertensive cerebrovascular disease. Extracranial soft tissues unremarkable. Flow voids are maintained, LEFT vertebral dominant. No layering sinus fluid. Major dural venous sinuses patent. IMPRESSION: Continued involution of postsurgical change, RIGHT temporal lobe without worrisome postcontrast features. Continued surveillance is warranted. New 4 mm lesion in the superior vermis, favored to represent an intra-axial metastasis rather than leptomeningeal spread of tumor. Atrophy and small vessel disease, stable. These results were called by telephone at the time of interpretation on 01/24/2016 at 1:52 pm to Radiation Oncology nurse, who verbally acknowledged these results. Electronically Signed   By: Staci Righter M.D.   On: 01/24/2016 13:58    Impression:  Patient with small cell lung cancer with a new 4 mm apparent metastasis on recent MRI scan of the brain discussed whole brain radiation treatment once again but patient refuses any radiation. We discussed that whole brain radiation treatment in the setting of her disease is standard of care. This treatment was again discussed in multidisciplinary brain conference given a new finding of a small 4 mm metastasis. I also discussed the possibility of radiosurgery to this lesion alone, given the fact that the postoperative site looks good on this recent MRI scan. However, the patient continues to be very reluctant to proceed with any radiation treatment and she decided today to forego any radiation treatment currently. We also discussed the recommendation therefore to proceed with an MRI scan of the brain in 3 months. However, the patient insists that she would like to have this done in 4 months instead.  Plan:  The patient will undergo a MRI scan  of the brain in 4 months with subsequent follow-up.  The patient was seen today for 20 minutes, with the majority of the time spent counseling the patient on his diagnosis of cancer and coordinating his care.   Jodelle Gross, M.D., Ph.D.    This document serves as a record of services personally performed by Kyung Rudd, MD. It was created on his behalf by Bethann Humble, a trained medical scribe. The creation of this record is based on the scribe's personal observations and the provider's statements to them. This document has been checked and approved by the attending provider.

## 2016-01-26 NOTE — Progress Notes (Signed)
Margaret Hale here for report of her MRI Of the brain,  Her recent scan from 01/24/16 shows a new 4 mm metastasis in the midline superior vermis.  She denies any ataxia, blurred vision, nor headaches at this time.       BP (!) 153/60 (BP Location: Left Arm, Patient Position: Sitting, Cuff Size: Normal)   Pulse (!) 55   Temp 97.8 F (36.6 C) (Oral)   Ht '5\' 4"'$  (1.626 m)   Wt 188 lb 6.4 oz (85.5 kg)   SpO2 99%   BMI 32.34 kg/m    Wt Readings from Last 3 Encounters:  01/26/16 188 lb 6.4 oz (85.5 kg)  12/28/15 184 lb 14.4 oz (83.9 kg)  11/16/15 182 lb 3.2 oz (82.6 kg)

## 2016-02-08 ENCOUNTER — Ambulatory Visit (HOSPITAL_BASED_OUTPATIENT_CLINIC_OR_DEPARTMENT_OTHER): Payer: Medicare Other | Admitting: Oncology

## 2016-02-08 VITALS — BP 159/64 | HR 51 | Temp 98.1°F | Resp 16 | Ht 64.0 in | Wt 193.6 lb

## 2016-02-08 DIAGNOSIS — C7931 Secondary malignant neoplasm of brain: Secondary | ICD-10-CM | POA: Diagnosis not present

## 2016-02-08 DIAGNOSIS — C3490 Malignant neoplasm of unspecified part of unspecified bronchus or lung: Secondary | ICD-10-CM

## 2016-02-08 NOTE — Progress Notes (Signed)
  [place] OFFICE PROGRESS NOTE   Diagnosis: Small cell lung cancer  INTERVAL HISTORY:   She returns as scheduled. She decided against enrollment on the maintenance therapy trial. [name] feels well. Her chief complaint is weight gain. She has gained weight despite adjusting her diet. A restaging MRI of the brain on 01/24/2016 revealed a new 4 mm lesion in the superior vermis consistent with a metastasis. Continued involution of the post surgical site.  She met with Dr. Lisbeth Renshaw and declined radiation. Objective:  Vital signs in last 24 hours:  Blood pressure (!) 159/64, pulse (!) 51, temperature 98.1 F (36.7 C), temperature source Oral, resp. rate 16, height '5\' 4"'$  (1.626 m), weight 193 lb 9.6 oz (87.8 kg), SpO2 100 %.    HEENT: Neck without mass Lymphatics: No cervical, supraclavicular, axillary, or inguinal nodes Resp: Lungs clear bilaterally Cardio: Regular rate and rhythm GI: No hepatosplenomegaly Vascular: No leg edema Neuro: Alert and oriented    Portacath/PICC-without erythema  Lab Results:  Lab Results  Component Value Date   WBC 5.2 12/28/2015   HGB 13.9 12/28/2015   HCT 42.2 12/28/2015   MCV 91.1 12/28/2015   PLT 155 12/28/2015   NEUTROABS 3.1 12/28/2015     Medications: I have reviewed the patient's current medications.  Assessment/Plan: 1. Extensive stage small cell lung cancer ? resection of right temporal brain mass 06/09/2015 confirmed metastatic small cell carcinoma ? Staging PET scan at [facility] 07/22/2015 revealed decreased activity associated with previously seen pulmonary nodules , decreased activity of hypermetabolic mediastinal and bilateral hilar nodes , no new sites of FDG avid metastatic disease compared to a [facility] PET scan from November 2016  Cycle 1 etoposide/carboplatin 08/16/2015  Cycle 2 etoposide/carboplatin 09/14/2015  Cycle 3 etoposide/carboplatin 10/19/2015  Restaging chest CT 11/12/2015 with generally  stable appearance of solid, sub-solid and ground glass pulmonary nodules bilaterally. Stable mildly prominent mediastinal lymph nodes. No progressive adenopathy or new findings.  Cycle 4 etoposide/carboplatin 11/16/2015   Brain MRI 01/24/2016-new 4 mm superior vermis metastasis  2. Chronic history of mediastinal lymphadenopathy and pulmonary nodules-diagnostic workup in 2013 including bronchoscopy/ EBUS , and mediastinoscopy -negative for malignancy, , granulomatous changes noted  3. Hypertension  4. History of CAD  5. "Sweats "-most likely related to post menopausal hot flashes  6. Port-A-Cath placement 10/26/2015     Disposition:  [name] appears stable. She decided against the maintenance chemotherapy trial. A restaging brain MRI on 01/24/2016 confirmed a new 4 mm vermis metastasis. She declines both whole brain and stereotactic radiation. I reviewed the MRI images with her. She will contact us for neurologic symptoms.  She will return for a Port-A-Cath flush on 03/06/2016. She will be scheduled for an office visit and Port-A-Cath flush on 04/17/2016.  She requests a restaging chest CT be scheduled for January followed by a brain MRI approximate 1 month later. We will discuss the timing of restaging scans again when she returns in November.  Betsy Coder, MD  02/08/2016  1:17 PM

## 2016-02-22 ENCOUNTER — Other Ambulatory Visit: Payer: Self-pay | Admitting: Family Medicine

## 2016-02-22 DIAGNOSIS — Z1231 Encounter for screening mammogram for malignant neoplasm of breast: Secondary | ICD-10-CM

## 2016-02-24 ENCOUNTER — Telehealth: Payer: Self-pay | Admitting: Oncology

## 2016-02-24 NOTE — Telephone Encounter (Signed)
Spoke with patient re appointments 10/9 and 11/20.

## 2016-02-24 NOTE — Addendum Note (Signed)
Encounter addended by: Doreen Beam, RN on: 02/24/2016  8:01 AM<BR>    Actions taken: Charge Capture section accepted

## 2016-02-29 ENCOUNTER — Ambulatory Visit: Payer: Medicare Other

## 2016-02-29 ENCOUNTER — Ambulatory Visit
Admission: RE | Admit: 2016-02-29 | Discharge: 2016-02-29 | Disposition: A | Discharge status: Home / Self Care | Payer: Medicare Other | Source: Ambulatory Visit | Attending: Family Medicine | Admitting: Family Medicine

## 2016-02-29 ENCOUNTER — Other Ambulatory Visit: Payer: Self-pay | Admitting: Family Medicine

## 2016-02-29 DIAGNOSIS — Z1231 Encounter for screening mammogram for malignant neoplasm of breast: Secondary | ICD-10-CM

## 2016-02-29 DIAGNOSIS — N644 Mastodynia: Secondary | ICD-10-CM

## 2016-03-06 ENCOUNTER — Ambulatory Visit (HOSPITAL_BASED_OUTPATIENT_CLINIC_OR_DEPARTMENT_OTHER): Payer: Medicare Other

## 2016-03-06 DIAGNOSIS — Z452 Encounter for adjustment and management of vascular access device: Secondary | ICD-10-CM | POA: Diagnosis not present

## 2016-03-06 DIAGNOSIS — Z95828 Presence of other vascular implants and grafts: Secondary | ICD-10-CM

## 2016-03-06 DIAGNOSIS — C3491 Malignant neoplasm of unspecified part of right bronchus or lung: Secondary | ICD-10-CM

## 2016-03-06 DIAGNOSIS — C7931 Secondary malignant neoplasm of brain: Secondary | ICD-10-CM

## 2016-03-06 MED ORDER — HEPARIN SOD (PORK) LOCK FLUSH 100 UNIT/ML IV SOLN
500.0000 [IU] | Freq: Once | INTRAVENOUS | Status: AC | PRN
  Administered 2016-03-06: 500 [IU] via INTRAVENOUS
  Filled 2016-03-06: qty 5

## 2016-03-06 MED ORDER — SODIUM CHLORIDE 0.9 % IJ SOLN
10.0000 mL | INTRAMUSCULAR | Status: DC | PRN
  Administered 2016-03-06: 10 mL via INTRAVENOUS
  Filled 2016-03-06: qty 10

## 2016-04-13 ENCOUNTER — Other Ambulatory Visit: Payer: Self-pay | Admitting: Radiation Therapy

## 2016-04-13 DIAGNOSIS — C7931 Secondary malignant neoplasm of brain: Secondary | ICD-10-CM

## 2016-04-13 DIAGNOSIS — C7949 Secondary malignant neoplasm of other parts of nervous system: Principal | ICD-10-CM

## 2016-04-17 ENCOUNTER — Telehealth: Payer: Self-pay | Admitting: Oncology

## 2016-04-17 ENCOUNTER — Ambulatory Visit (HOSPITAL_BASED_OUTPATIENT_CLINIC_OR_DEPARTMENT_OTHER): Payer: Medicare Other | Admitting: Oncology

## 2016-04-17 ENCOUNTER — Ambulatory Visit: Payer: Medicare Other

## 2016-04-17 VITALS — BP 146/68 | HR 60 | Temp 98.1°F | Resp 18 | Ht 64.0 in | Wt 185.4 lb

## 2016-04-17 DIAGNOSIS — C7931 Secondary malignant neoplasm of brain: Secondary | ICD-10-CM

## 2016-04-17 DIAGNOSIS — C3490 Malignant neoplasm of unspecified part of unspecified bronchus or lung: Secondary | ICD-10-CM

## 2016-04-17 DIAGNOSIS — C3491 Malignant neoplasm of unspecified part of right bronchus or lung: Secondary | ICD-10-CM

## 2016-04-17 DIAGNOSIS — Z95828 Presence of other vascular implants and grafts: Secondary | ICD-10-CM

## 2016-04-17 DIAGNOSIS — I10 Essential (primary) hypertension: Secondary | ICD-10-CM | POA: Diagnosis not present

## 2016-04-17 MED ORDER — SODIUM CHLORIDE 0.9 % IJ SOLN
10.0000 mL | INTRAMUSCULAR | Status: DC | PRN
  Administered 2016-04-17: 10 mL via INTRAVENOUS
  Filled 2016-04-17: qty 10

## 2016-04-17 MED ORDER — HEPARIN SOD (PORK) LOCK FLUSH 100 UNIT/ML IV SOLN
500.0000 [IU] | Freq: Once | INTRAVENOUS | Status: AC | PRN
  Administered 2016-04-17: 500 [IU] via INTRAVENOUS
  Filled 2016-04-17: qty 5

## 2016-04-17 NOTE — Telephone Encounter (Signed)
Appointments scheduled per 04/17/16 los. A copy of the AVS report & appointment schedule was given to patient,per 04/17/16 los.

## 2016-04-17 NOTE — Progress Notes (Signed)
  [place] OFFICE PROGRESS NOTE   Diagnosis: Small cell lung cancer  INTERVAL HISTORY:   She returns as scheduled. She developed an upper respiratory infection with sinus congestion and drainage beginning on 04/14/2016. No fever or dyspnea. Good appetite. No neurologic symptoms.  Objective:  Vital signs in last 24 hours:  Blood pressure (!) 146/68, pulse 60, temperature 98.1 F (36.7 C), temperature source Oral, resp. rate 18, height '5\' 4"'$  (1.626 m), weight 185 lb 6.4 oz (84.1 kg), SpO2 98 %.    HEENT: Pharynx without exudate, mild erythema Lymphatics: No cervical, supraclavicular, axillary, or inguinal nodes Resp: Good air movement bilaterally, scattered coarse rhonchi and wheezes, no respiratory distress Cardio: Regular rate and rhythm GI: No hepatomegaly, no mass, nontender Vascular: No leg edema  Portacath-with faint erythema overlying the insertion scar, no tenderness  Lab Results:  Lab Results  Component Value Date   WBC 5.2 12/28/2015   HGB 13.9 12/28/2015   HCT 42.2 12/28/2015   MCV 91.1 12/28/2015   PLT 155 12/28/2015   NEUTROABS 3.1 12/28/2015     Medications: I have reviewed the patient's current medications.  Assessment/Plan: 1. Extensive stage small cell lung cancer ? resection of right temporal brain mass 06/09/2015 confirmed metastatic small cell carcinoma ? Staging PET scan at [facility] 07/22/2015 revealed decreased activity associated with previously seen pulmonary nodules , decreased activity of hypermetabolic mediastinal and bilateral hilar nodes , no new sites of FDG avid metastatic disease compared to a [facility] PET scan from November 2016  Cycle 1 etoposide/carboplatin 08/16/2015  Cycle 2 etoposide/carboplatin 09/14/2015  Cycle 3 etoposide/carboplatin 10/19/2015  Restaging chest CT 11/12/2015 with generally stable appearance of solid, sub-solid and ground glass pulmonary nodules bilaterally. Stable mildly prominent mediastinal  lymph nodes. No progressive adenopathy or new findings.  Cycle 4 etoposide/carboplatin 11/16/2015   Brain MRI 01/24/2016-new 4 mm superior vermis metastasis  2. Chronic history of mediastinal lymphadenopathy and pulmonary nodules-diagnostic workup in 2013 including bronchoscopy/ EBUS , and mediastinoscopy -negative for malignancy, , granulomatous changes noted  3. Hypertension  4. History of CAD  5. "Sweats "-most likely related to post menopausal hot flashes  6. Port-A-Cath placement 10/26/2015    Disposition:  She appears unchanged. No clinical evidence for progression of the small cell carcinoma. She would like to wait on a restaging chest CT until January. She does not wish to undergo a repeat brain MRI until February.  She will return for an office visit, chest CT, and Port-A-Cath flush during the second week of January 2018. She will contact us in the interim as needed.  Betsy Coder, MD  04/17/2016  12:51 PM

## 2016-05-29 DIAGNOSIS — Z923 Personal history of irradiation: Secondary | ICD-10-CM

## 2016-05-29 HISTORY — DX: Personal history of irradiation: Z92.3

## 2016-06-01 ENCOUNTER — Ambulatory Visit (HOSPITAL_COMMUNITY): Payer: Medicare Other

## 2016-06-05 ENCOUNTER — Ambulatory Visit: Payer: Self-pay | Admitting: Radiation Oncology

## 2016-06-07 ENCOUNTER — Ambulatory Visit (HOSPITAL_COMMUNITY)
Admission: RE | Admit: 2016-06-07 | Discharge: 2016-06-07 | Disposition: A | Discharge status: Home / Self Care | Payer: Medicare Other | Source: Ambulatory Visit | Attending: Oncology | Admitting: Oncology

## 2016-06-07 DIAGNOSIS — C3491 Malignant neoplasm of unspecified part of right bronchus or lung: Secondary | ICD-10-CM | POA: Insufficient documentation

## 2016-06-07 DIAGNOSIS — I700 Atherosclerosis of aorta: Secondary | ICD-10-CM | POA: Diagnosis not present

## 2016-06-08 ENCOUNTER — Ambulatory Visit (HOSPITAL_BASED_OUTPATIENT_CLINIC_OR_DEPARTMENT_OTHER): Payer: Medicare Other | Admitting: Oncology

## 2016-06-08 ENCOUNTER — Ambulatory Visit: Payer: Medicare Other

## 2016-06-08 ENCOUNTER — Other Ambulatory Visit (HOSPITAL_BASED_OUTPATIENT_CLINIC_OR_DEPARTMENT_OTHER): Payer: Medicare Other

## 2016-06-08 VITALS — BP 157/65 | HR 60 | Temp 97.9°F | Resp 18 | Wt 191.4 lb

## 2016-06-08 DIAGNOSIS — C3491 Malignant neoplasm of unspecified part of right bronchus or lung: Secondary | ICD-10-CM

## 2016-06-08 DIAGNOSIS — I10 Essential (primary) hypertension: Secondary | ICD-10-CM

## 2016-06-08 DIAGNOSIS — C7931 Secondary malignant neoplasm of brain: Secondary | ICD-10-CM

## 2016-06-08 DIAGNOSIS — Z95828 Presence of other vascular implants and grafts: Secondary | ICD-10-CM

## 2016-06-08 LAB — BASIC METABOLIC PANEL
Anion Gap: 6 mEq/L (ref 3–11)
BUN: 24.7 mg/dL (ref 7.0–26.0)
CHLORIDE: 106 meq/L (ref 98–109)
CO2: 29 mEq/L (ref 22–29)
Calcium: 9.3 mg/dL (ref 8.4–10.4)
Creatinine: 0.8 mg/dL (ref 0.6–1.1)
EGFR: 73 mL/min/{1.73_m2} — ABNORMAL LOW (ref >90)
Glucose: 120 mg/dl (ref 70–140)
POTASSIUM: 4.1 meq/L (ref 3.5–5.1)
Sodium: 141 mEq/L (ref 136–145)

## 2016-06-08 MED ORDER — SODIUM CHLORIDE 0.9 % IJ SOLN
10.0000 mL | INTRAMUSCULAR | Status: DC | PRN
  Administered 2016-06-08: 10 mL via INTRAVENOUS
  Filled 2016-06-08: qty 10

## 2016-06-08 MED ORDER — HEPARIN SOD (PORK) LOCK FLUSH 100 UNIT/ML IV SOLN
500.0000 [IU] | Freq: Once | INTRAVENOUS | Status: AC | PRN
  Administered 2016-06-08: 500 [IU] via INTRAVENOUS
  Filled 2016-06-08: qty 5

## 2016-06-08 NOTE — Progress Notes (Signed)
[place] OFFICE PROGRESS NOTE   Diagnosis: Small cell carcinoma  INTERVAL HISTORY:   She returns as scheduled. She reports having a "cold "recently. She has chronic low back pain. No balance difficulty. Good appetite.  Objective:  Vital signs in last 24 hours:  Blood pressure (!) 157/65, pulse 60, temperature 97.9 F (36.6 C), temperature source Oral, resp. rate 18, weight 191 lb 6.4 oz (86.8 kg), SpO2 100 %.    HEENT: Neck without mass Lymphatics: No cervical, supraclavicular, or axillary nodes Resp: Lungs clear bilaterally Cardio: Regular rate and rhythm GI: No hepatomegaly, no mass, nontender Vascular: No leg edema   Portacath/PICC-without erythema  Imaging:  Ct Chest Wo Contrast  Result Date: 06/07/2016 CLINICAL DATA:  Staging non-small cell lung cancer. Initial diagnosis November 2016. Chemotherapy and radiation therapy complete. EXAM: CT CHEST WITHOUT CONTRAST TECHNIQUE: Multidetector CT imaging of the chest was performed following the standard protocol without IV contrast. COMPARISON:  11/12/2015 chest CT. FINDINGS: Chest wall: Stable right-sided Port-A-Cath. No breast masses, supraclavicular or axillary lymphadenopathy. There is a stable right-sided thyroid gland lesion measuring a maximum of 19 mm. Cardiovascular: The heart is normal in size. No pericardial effusion. Stable advanced atherosclerotic calcifications involving the aorta and branch vessels and extensive three-vessel coronary artery calcifications. Dense mitral valve annular calcifications are also noted. Mediastinum/Nodes: Stable old borderline mediastinal and hilar lymph nodes. Precarinal lymph node on image number 57 measures 11 mm and previously measured 12 mm. Subcarinal lymph node on image number 67 measures 9 mm and previously measured 9 mm. No new or enlarging lymph nodes. The esophagus is grossly normal. Lungs/Pleura: Sub solid and ground-glass nodularity in the right upper lobe on image  number 45 measures 18 x 15 mm and previously measured 16 x 13 mm. Solid and sub solid nodule in the left upper lobe on image number 71 measures 9.5 x 7 mm and previously measured 12 x 8 mm. Solid nodule in the right middle lobe on image number 81 measures 10 x 8 mm and previously measured 11 x 9 mm. Several small scattered pulmonary nodules are stable. No new pulmonary lesions or acute overlying pulmonary findings. Stable biapical pleural and parenchymal scarring. Stable emphysematous changes and areas of pulmonary scarring. No pleural effusion. Upper Abdomen: No significant upper abdominal findings. Stable advanced atherosclerotic calcifications involving the aorta and branch vessels. No definite hepatic or adrenal gland metastasis. Musculoskeletal: No significant bony findings. Moderate degenerative changes involving the thoracic spine. IMPRESSION: 1. Overall stable solid and sub solid pulmonary nodules in both lungs. No significant progressive changes or new pulmonary lesions. 2. Stable mediastinal lymph nodes. 3. Stable emphysematous changes and pulmonary scarring. No acute overlying pulmonary process. 4. Stable advanced atherosclerotic calcifications involving the aorta and branch vessels including the coronary arteries. 5. Stable right thyroid lesion. Electronically Signed   By: Marijo Sanes M.D.   On: 06/07/2016 15:52    Medications: I have reviewed the patient's current medications.  Assessment/Plan: 1. Extensive stage small cell lung cancer ? resection of right temporal brain mass 06/09/2015 confirmed metastatic small cell carcinoma ? Staging PET scan at [facility] 07/22/2015 revealed decreased activity associated with previously seen pulmonary nodules , decreased activity of hypermetabolic mediastinal and bilateral hilar nodes , no new sites of FDG avid metastatic disease compared to a [facility] PET scan from November 2016  Cycle 1 etoposide/carboplatin 08/16/2015  Cycle 2 etoposide/carboplatin  09/14/2015  Cycle 3 etoposide/carboplatin 10/19/2015  Restaging chest CT 11/12/2015 with generally stable appearance of solid,  sub-solid and ground glass pulmonary nodules bilaterally. Stable mildly prominent mediastinal lymph nodes. No progressive adenopathy or new findings.  Cycle 4 etoposide/carboplatin 11/16/2015   Brain MRI 01/24/2016-new 4 mm superior vermis metastasis  CT chest 06/07/2016-stable solid/sub-solid pulmonary nodules, stable mediastinal lymph nodes, no evidence of progressive disease  2. Chronic history of mediastinal lymphadenopathy and pulmonary nodules-diagnostic workup in 2013 including bronchoscopy/ EBUS , and mediastinoscopy -negative for malignancy, , granulomatous changes noted  3. Hypertension  4. History of CAD  5. "Sweats "-most likely related to post menopausal hot flashes  6. Port-A-Cath placement 10/26/2015     Disposition:  Ms. Horiuchi appears unchanged. No clinical or x-ray evidence of disease progression. She will undergo a restaging brain MRI prior to a follow-up visit with Dr. Lisbeth Renshaw next month. She will return for an office visit and Port-A-Cath flush here on 07/20/2016.    Betsy Coder, MD  06/08/2016  11:34 AM

## 2016-06-11 ENCOUNTER — Telehealth: Payer: Self-pay | Admitting: Oncology

## 2016-06-11 NOTE — Telephone Encounter (Signed)
Lvm advising appt 2/22 @ 11.45. Also mailed calendar.

## 2016-07-03 NOTE — Progress Notes (Addendum)
Margaret Hale 82 y.o. woman with  Stage IV (t1a, N3, M1 B) small cell lung cancer with brain metastasis radiation completed review MRI brain w wo contrast 07-05-16 FU.   Headache:None Dizziness:None Nausea/vomiting: Visual changes/Diplopia:None Ringing in ears: None Fatigue:Having fatigue in the mornings. Cognitive changes: Feels memory has been good. Answered questions quickly with complete sentences. Weight: 9.5 weight loss since last visit appetite eating two meals per day. Wt Readings from Last 3 Encounters:  07/10/16 182 lb 12.8 oz (82.9 kg)  06/08/16 191 lb 6.4 oz (86.8 kg)  04/17/16 185 lb 6.4 oz (84.1 kg)  BP (!) 146/68   Pulse 63   Temp 97.8 F (36.6 C) (Oral)   Resp 18   Ht '5\' 4"'$  (1.626 m)   Wt 182 lb 12.8 oz (82.9 kg)   SpO2 100%   BMI 31.38 kg/m Left arm sitting BP (!) 136/59   Pulse 65   Temp 97.8 F (36.6 C) (Oral)   Resp 18   Ht '5\' 4"'$  (1.626 m)   Wt 182 lb 12.8 oz (82.9 kg)   SpO2 100%   BMI 31.38 kg/m  Left arm sitting BP 114/66   Pulse 65   Temp 97.8 F (36.6 C) (Oral)   Resp 18   Ht '5\' 4"'$  (1.626 m)   Wt 182 lb 12.8 oz (82.9 kg)   SpO2 100%   BMI 31.38 kg/m   Left arm standing

## 2016-07-05 ENCOUNTER — Ambulatory Visit (HOSPITAL_COMMUNITY)
Admission: RE | Admit: 2016-07-05 | Discharge: 2016-07-05 | Disposition: A | Discharge status: Home / Self Care | Payer: Medicare Other | Source: Ambulatory Visit | Attending: Radiation Oncology | Admitting: Radiation Oncology

## 2016-07-05 ENCOUNTER — Telehealth: Payer: Self-pay | Admitting: *Deleted

## 2016-07-05 DIAGNOSIS — C7949 Secondary malignant neoplasm of other parts of nervous system: Secondary | ICD-10-CM | POA: Diagnosis present

## 2016-07-05 DIAGNOSIS — C7931 Secondary malignant neoplasm of brain: Secondary | ICD-10-CM | POA: Insufficient documentation

## 2016-07-05 MED ORDER — GADOBENATE DIMEGLUMINE 529 MG/ML IV SOLN
20.0000 mL | Freq: Once | INTRAVENOUS | Status: AC | PRN
  Administered 2016-07-05: 20 mL via INTRAVENOUS

## 2016-07-05 NOTE — Telephone Encounter (Signed)
Triage call: Received call report from MRI. Significant growth of untreated metastasis. Thayer Headings, RN with Dr. Lisbeth Renshaw made aware. She will notify MD.

## 2016-07-05 NOTE — Telephone Encounter (Signed)
Report MRI today results brian abnormal increased from 52m to 266m  Patient has appt 07/10/16, thanked To[place] MRI results from today [facility] to [place],gave report to him 3:25 PM

## 2016-07-06 ENCOUNTER — Telehealth: Payer: Self-pay | Admitting: Radiation Oncology

## 2016-07-06 ENCOUNTER — Telehealth: Payer: Self-pay | Admitting: *Deleted

## 2016-07-06 NOTE — Telephone Encounter (Signed)
error 

## 2016-07-06 NOTE — Telephone Encounter (Signed)
I called and spoke with the patient's son to remind him of her appointment next Monday and to explain some of the findings from her MRI. He states that he does not think she will be willing to move forward with radiotherapy. We discussed the need for steroids as well. He will discuss this with her and let me know if she wants to start steroids.

## 2016-07-10 ENCOUNTER — Other Ambulatory Visit: Payer: Self-pay

## 2016-07-10 ENCOUNTER — Encounter: Payer: Self-pay | Admitting: Radiation Oncology

## 2016-07-10 ENCOUNTER — Telehealth: Payer: Self-pay | Admitting: Oncology

## 2016-07-10 ENCOUNTER — Ambulatory Visit (HOSPITAL_BASED_OUTPATIENT_CLINIC_OR_DEPARTMENT_OTHER): Payer: Medicare Other | Admitting: Oncology

## 2016-07-10 ENCOUNTER — Ambulatory Visit
Admission: RE | Admit: 2016-07-10 | Discharge: 2016-07-10 | Disposition: A | Discharge status: Home / Self Care | Payer: Medicare Other | Source: Ambulatory Visit | Attending: Radiation Oncology | Admitting: Radiation Oncology

## 2016-07-10 VITALS — BP 114/66 | HR 65 | Temp 97.8°F | Resp 18 | Ht 64.0 in | Wt 182.8 lb

## 2016-07-10 DIAGNOSIS — I10 Essential (primary) hypertension: Secondary | ICD-10-CM

## 2016-07-10 DIAGNOSIS — R911 Solitary pulmonary nodule: Secondary | ICD-10-CM

## 2016-07-10 DIAGNOSIS — Z87891 Personal history of nicotine dependence: Secondary | ICD-10-CM | POA: Diagnosis not present

## 2016-07-10 DIAGNOSIS — C3491 Malignant neoplasm of unspecified part of right bronchus or lung: Secondary | ICD-10-CM

## 2016-07-10 DIAGNOSIS — C7931 Secondary malignant neoplasm of brain: Secondary | ICD-10-CM | POA: Diagnosis not present

## 2016-07-10 DIAGNOSIS — Z9221 Personal history of antineoplastic chemotherapy: Secondary | ICD-10-CM | POA: Diagnosis not present

## 2016-07-10 DIAGNOSIS — C3490 Malignant neoplasm of unspecified part of unspecified bronchus or lung: Secondary | ICD-10-CM | POA: Insufficient documentation

## 2016-07-10 DIAGNOSIS — Z95828 Presence of other vascular implants and grafts: Secondary | ICD-10-CM

## 2016-07-10 DIAGNOSIS — Z79899 Other long term (current) drug therapy: Secondary | ICD-10-CM | POA: Diagnosis not present

## 2016-07-10 DIAGNOSIS — Z7982 Long term (current) use of aspirin: Secondary | ICD-10-CM | POA: Diagnosis not present

## 2016-07-10 MED ORDER — HEPARIN SOD (PORK) LOCK FLUSH 100 UNIT/ML IV SOLN
500.0000 [IU] | Freq: Once | INTRAVENOUS | Status: AC | PRN
  Administered 2016-07-10: 500 [IU] via INTRAVENOUS
  Filled 2016-07-10: qty 5

## 2016-07-10 MED ORDER — DEXAMETHASONE 4 MG PO TABS
4.0000 mg | ORAL_TABLET | Freq: Two times a day (BID) | ORAL | 0 refills | Status: DC
Start: 2016-07-10 — End: 2016-08-21

## 2016-07-10 MED ORDER — SODIUM CHLORIDE 0.9 % IJ SOLN
10.0000 mL | INTRAMUSCULAR | Status: DC | PRN
  Administered 2016-07-10: 10 mL via INTRAVENOUS
  Filled 2016-07-10: qty 10

## 2016-07-10 NOTE — Telephone Encounter (Signed)
Appointments scheduled per 2/12 LOS. Patient given AVS report and calendars with future scheduled appointments.

## 2016-07-10 NOTE — Progress Notes (Signed)
  [facility] OFFICE PROGRESS NOTE   Diagnosis: Small cell carcinoma  INTERVAL HISTORY:   Margaret Hale returns prior to a scheduled visit. She feels well. No dyspnea. Good appetite. No neurologic symptoms. A restaging MRI of the brain on 07/05/2016 revealed progression of the cerebellar vermis metastasis, now measuring 25 mm. There is effacement of the upper fourth ventricle. She is scheduled to see Dr. Lisbeth Renshaw later today.   Objective:  Vital signs in last 24 hours:  There were no vitals taken for this visit.    HEENT:  neck without mass Lymphatics:  no cervical, supraclavicular, axillary, or inguinal nodes Resp:  scattered end inspiratory coarse rhonchi, no respiratory distress Cardio:  regular rate and rhythm GI:  no hepatosplenomegaly, no mass Vascular:  no leg edema Neurologic: Alert and oriented   Portacath/PICC-without erythema    Imaging:   MRI brain 07/05/2016-images reviewed with Ms.  Hale   Medications: I have reviewed the patient's current medications.  Assessment/Plan: 1. Extensive stage small cell lung cancer ? resection of right temporal brain mass 06/09/2015 confirmed metastatic small cell carcinoma ? Staging PET scan at [facility] 07/22/2015 revealed decreased activity associated with previously seen pulmonary nodules , decreased activity of hypermetabolic mediastinal and bilateral hilar nodes , no new sites of FDG avid metastatic disease compared to a [facility] PET scan from November 2016  Cycle 1 etoposide/carboplatin 08/16/2015  Cycle 2 etoposide/carboplatin 09/14/2015  Cycle 3 etoposide/carboplatin 10/19/2015  Restaging chest CT 11/12/2015 with generally stable appearance of solid, sub-solid and ground glass pulmonary nodules bilaterally. Stable mildly prominent mediastinal lymph nodes. No progressive adenopathy or new findings.  Cycle 4 etoposide/carboplatin 11/16/2015   Brain MRI 01/24/2016-new 4 mm superior vermis  metastasis  CT chest 06/07/2016-stable solid/sub-solid pulmonary nodules, stable mediastinal lymph nodes, no evidence of progressive disease  MRI brain 07/05/2016-progression of the cerebellar metastasis with associated edema and effacement of the upper fourth ventricle   2. Chronic history of mediastinal lymphadenopathy and pulmonary nodules-diagnostic workup in 2013 including bronchoscopy/ EBUS , and mediastinoscopy -negative for malignancy, , granulomatous changes noted  3. Hypertension  4. History of CAD  5. "Sweats "-most likely related to post menopausal hot flashes  6. Port-A-Cath placement 10/26/2015    Disposition:   She appears stable. I reviewed the MRI brain images with her today. She is scheduled to see Dr. Lisbeth Renshaw later today. Margaret Hale is very concerned about the potential toxicity associated with brain radiation, especially dementia. I explained small cell carcinoma is generally treated with whole brain radiation. She would like to discuss the possibility of stereotactic radiation with Dr. Lisbeth Renshaw. She is scheduled to see Dr. Arnoldo Morale this week. I strongly encouraged her to proceed with treatment as she will develop symptoms if the cerebellar metastasis is not treated.  She will return for a Port-A-Cath flush later today. She will be scheduled for an office visit and Port-A-Cath flush in one month.  25 minutes were spent with the patient today. The majority of the time was used for counseling and coordination of care.  Betsy Coder, MD  07/10/2016  1:48 PM

## 2016-07-11 NOTE — Progress Notes (Signed)
Radiation Oncology         [phone] ________________________________  Name: Margaret Hale MRN: [id number]  Date: 07/10/2016  DOB: 09-Aug-1934  Follow-Up Visit Note  CC: [name], MD  Curt Bears, MD  Diagnosis:   Extensive Stage small cell lung cancer with brain metastasis  Interval Since Last Radiation:  n/a   Narrative:  Margaret Hale is a pleasant 82 y.o. woman originally from [place] who has a history of extensive stage small cell lung cancer. She has been treated with chemotherapy under the care of Dr. Benay Spice and has had documented brain disease since January 2017 after undergoing right temporal craniotomy to excise a 5.6 cm tumor. There was a 1.9 cm satellite lesion that was also removed, and consistent with small cell carcinoma. She has previously declined whole brain radiotherapy but continues in surveillance with [place]. She had an MRI in August which revealed involution and post surgical changes in the right temporal lobe without progression, and a new 4 mm lesion was seen in the superior vermis. She again was offered radiotherapy but declined. She had a repeat MRI on 07/05/16 revealing stability in the right temporal region, no new metastases, but the lesion in the superior vermis is now 25 mm and causing vasogenic edema, and concerning for possible obstruction of the 4th ventricle. She comes today to review these results and recommendations of care. From a systemic standpoint, it appears that her last CT chest in January 2018 looks very stable. She is being followed at this time in observation with medical oncology.  On review of systems, the patient reports that she is doing well overall. She denies any chest pain, shortness of breath, cough, fevers, chills, night sweats, unintended weight changes. She denies any headaches today, but last week had several days of a posterior headache at the base of her skull. Ibuprofen helped the throbbing quality of her symptoms. She  denies any gait disturbance, or imbalance when walking. She denies any other visual or auditory symptoms, and denies any bowel or bladder disturbances, and denies abdominal pain, nausea or vomiting. She denies any new musculoskeletal or joint aches or pains, new skin lesions or concerns. A complete review of systems is obtained and is otherwise negative.                        Past Medical History:  Past Medical History:  Diagnosis Date  . Anxiety   . Baker's cyst    L KNEE  . CAD (coronary artery disease) 2007   PCI of RCA, bare metal stent.....Dr. Radford Pax  . Cancer (HCC)    lung mass  . Disc disorder of lumbar region    2 BULDGING DISCS DX 1990 BY DR. DALLDORF  . Dizzy   . Hyperlipidemia   . Hypertension    Takes Novasc ,Clonidine, & Losartan  . No pertinent past medical history    lung mass  occas cough   . Thyroid disease    nodules,stable on u/s 2/12  . Vitamin D deficiency     Past Surgical History: Past Surgical History:  Procedure Laterality Date  . CARPAL TUNNEL RELEASE  06/26/08        04/20/09   RIGHT HAND          LEFT HAND  . CORONARY ANGIOPLASTY     dr h Tamala Julian [id number] with one stent  . CRANIOTOMY Right 06/09/2015   Procedure: CRANIOTOMY TUMOR EXCISION WITH APPLICATION OF CRANIAL NAVIGATION;  Surgeon:  Newman Pies, MD;  Location: [facility];  Service: Neurosurgery;  Laterality: Right;  Right temporal craniotomy for resection of tumor  . EYE SURGERY  01/07/09   LEFT CATARACT  . EYE SURGERY  01/21/09   RIGHT CATARACT  . FINGER CONTRACTURE RELEASE  8/12    Dr. Amedeo Plenty   right hand trigger finger release  . MEDIASTINOSCOPY  08/02/2011   Procedure: MEDIASTINOSCOPY;  Surgeon: Nicanor Alcon, MD;  Location[facility];  Service: Thoracic;  Laterality: N/A;    Social History:  Social History   Social History  . Marital status: Widowed    Spouse name: N/A  . Number of children: 2  . Years of education: N/A   Occupational History  . Retired    Social History Main  Topics  . Smoking status: Former Smoker    Packs/day: 0.50    Years: 10.00    Types: Cigarettes    Quit date: 07/12/2005  . Smokeless tobacco: Never Used  . Alcohol use No  . Drug use: No  . Sexual activity: Not on file   Other Topics Concern  . Not on file   Social History Narrative  . No narrative on file    Family History: Family History  Problem Relation Age of Onset  . Anesthesia problems Neg Hx   . Hypotension Neg Hx   . Malignant hyperthermia Neg Hx   . Pseudochol deficiency Neg Hx     ALLERGIES:  is allergic to food; lipitor [atorvastatin calcium]; tape; and pantoprazole sodium.  Meds: Current Outpatient Prescriptions  Medication Sig Dispense Refill  . amLODipine (NORVASC) 10 MG tablet Take 10 mg by mouth every evening.     Marland Kitchen aspirin 81 MG tablet Take 81 mg by mouth daily.    . hydrochlorothiazide (HYDRODIURIL) 25 MG tablet Take 25 mg by mouth daily.    Marland Kitchen ibuprofen (ADVIL,MOTRIN) 200 MG tablet Take 200 mg by mouth every 6 (six) hours as needed.    . lidocaine-prilocaine (EMLA) cream Apply 1 application topically as needed. Apply to port site 1 hr prior to port access and cover with plastic wrap 30 g 0  . metoprolol succinate (TOPROL-XL) 100 MG 24 hr tablet Take 100 mg by mouth daily. Take with or immediately following a meal.    . Omega-3 Fatty Acids (FISH OIL PO) Take 4,000 Units by mouth 2 (two) times daily.    Marland Kitchen rOPINIRole (REQUIP) 1 MG tablet Take 1 mg by mouth at bedtime.    . triamcinolone cream (KENALOG) 0.1 %     . valsartan (DIOVAN) 320 MG tablet Take 320 mg by mouth daily.    Marland Kitchen dexamethasone (DECADRON) 4 MG tablet Take 1 tablet (4 mg total) by mouth 2 (two) times daily with a meal. 60 tablet 0   No current facility-administered medications for this encounter.     Physical Findings:  height is '5\' 4"'$  (1.626 m) and weight is 182 lb 12.8 oz (82.9 kg). Her oral temperature is 97.8 F (36.6 C). Her blood pressure is 114/66 and her pulse is 65. Her  respiration is 18 and oxygen saturation is 100%.   In general this is a well appearing caucasian female in no acute distress. She's alert and oriented x4 and appropriate throughout the examination. Cardiopulmonary assessment is negative for acute distress and she exhibits normal effort. Her gait is unremarkable, and she appears grossly intact from a neurologic perspective.    Lab Findings: Lab Results  Component Value Date  WBC 5.2 12/28/2015   HGB 13.9 12/28/2015   HCT 42.2 12/28/2015   MCV 91.1 12/28/2015   PLT 155 12/28/2015     Radiographic Findings: Mr Jeri Cos TK Contrast  Result Date: 07/05/2016 CLINICAL DATA:  Lung cancer with right temporal brain metastasis, previously resected. Undergoing chemotherapy. Patient declined radiotherapy. EXAM: MRI HEAD WITHOUT AND WITH CONTRAST TECHNIQUE: Multiplanar, multiecho pulse sequences of the brain and surrounding structures were obtained without and with intravenous contrast. CONTRAST:  69m MULTIHANCE GADOBENATE DIMEGLUMINE 529 MG/ML IV SOLN COMPARISON:  02/24/2016 FINDINGS: Brain: Superior vermian metastasis has significantly increased from prior, now 23 x 25 x 22 mm. The mass has heterogeneous internal architecture and enhancement. There is new vasogenic edema in the upper cerebellum, with upper fourth ventricular effacement. The patient is certainly at risk for obstructive hydrocephalus, but none seen currently. Stable with flat/wispy enhancement at the right lateral temporal lobe resection site. No evidence of progressive/recurrent disease in this location. No new metastasis identified. Extensive chronic small vessel disease with ischemic gliosis throughout cerebral white matter and remote lacunar infarct in the right corona radiata. No acute infarct, acute hemorrhage, or midline shift. Vascular: Preserved flow voids Skull and upper cervical spine: No marrow lesion noted Sinuses/Orbits: Bilateral cataract resection.  Negative for mass. Other:  These results will be called to the ordering clinician or representative by the Radiologist Assistant, and communication documented in the PACS or zVision Dashboard. IMPRESSION: 1. Significant interval growth of the untreated superior vermian metastasis, now 25 mm compared to 4 mm previously. Enlarging tumor and new vasogenic edema causes notable effacement of the upper fourth ventricle. Patient is at risk for obstructive hydrocephalus, although none seen currently. 2. Stable right lateral temporal lobe resection site. 3. No new metastasis identified. Electronically Signed   By: JMonte FantasiaM.D.   On: 07/05/2016 15:03    Impression/Plan: 1. Extensive stage small cell carcinoma of the lung. The patient appears to be doing pretty well neurologically. We reviewed her MRI results however and explained the concerns for tumor progression. Given the location of her disease, it is concerning that without treatment, she will develop hydrocephalous which could lead to a fatal event. We discussed the options for radiotherapy, and would recommend whole brain radiotherapy. She is not interested in this form of treatment out of concern for cognitive changes or onset of dementia. Although we have had multiple conversations about this, and do not believe that these symptoms would occur, she has decided against this. We also discussed the recommendations for SRS per our discussion from [facility], which would be to consider SRS to this site. We also discussed the role for steroids, and she is in agreement to start Dexamethasone 4 mg BID. This was called into her pharmacy and will be tapered after SRS treatment. We discussed the risks, benefits, short, and long term effects of radiotherapy. She will move forward with meeting Dr. SVertell Limberin neurosurgery, and return for simulation as well. We will review consent at the time of her simulation.      ACarola Rhine PAC

## 2016-07-12 NOTE — Progress Notes (Signed)
Has armband been applied?  Yes.    Does patient have an allergy to IV contrast dye?: No.   Has patient ever received premedication for IV contrast dye?: No.   Does patient take metformin?: No.  If patient does take metformin when was the last dose:Not diabetic  Date of lab work: 1/11/218 BUN: 24.7 CR: 0.8  IV Site:using sterile technique,   Right port a cath 212 g 1 inch  Huber needle, accessed x 1 attempt; , excellent blood return, flushed 47m normal saline, op site placed over sie, clamped ,patient tolerated well   Has IV site been added to flowsheet?  Yes.    BP (!) 166/70 (BP Location: Left Arm, Patient Position: Sitting, Cuff Size: Normal)   Pulse 67   Temp 98.4 F (36.9 C) (Oral)   Resp 20   Ht '5\' 4"'$  (1.626 m)   Wt 187 lb (84.8 kg)   SpO2 97%   BMI 32.10 kg/m

## 2016-07-13 ENCOUNTER — Encounter: Payer: Self-pay | Admitting: Radiation Therapy

## 2016-07-13 ENCOUNTER — Ambulatory Visit
Admission: RE | Admit: 2016-07-13 | Discharge: 2016-07-13 | Disposition: A | Discharge status: Home / Self Care | Payer: Medicare Other | Source: Ambulatory Visit | Attending: Radiation Oncology | Admitting: Radiation Oncology

## 2016-07-13 VITALS — BP 166/70 | HR 67 | Temp 98.4°F | Resp 20 | Ht 64.0 in | Wt 187.0 lb

## 2016-07-13 DIAGNOSIS — C7931 Secondary malignant neoplasm of brain: Secondary | ICD-10-CM | POA: Diagnosis not present

## 2016-07-13 DIAGNOSIS — Z510 Encounter for antineoplastic radiation therapy: Secondary | ICD-10-CM | POA: Insufficient documentation

## 2016-07-13 MED ORDER — SODIUM CHLORIDE 0.9% FLUSH
10.0000 mL | Freq: Once | INTRAVENOUS | Status: AC
  Administered 2016-07-13: 10 mL via INTRAVENOUS

## 2016-07-13 MED ORDER — HEPARIN SOD (PORK) LOCK FLUSH 100 UNIT/ML IV SOLN
500.0000 [IU] | Freq: Once | INTRAVENOUS | Status: AC
  Administered 2016-07-13: 500 [IU] via INTRAVENOUS

## 2016-07-17 DIAGNOSIS — Z510 Encounter for antineoplastic radiation therapy: Secondary | ICD-10-CM | POA: Diagnosis not present

## 2016-07-20 ENCOUNTER — Ambulatory Visit: Payer: Medicare Other | Admitting: Nurse Practitioner

## 2016-07-21 ENCOUNTER — Telehealth: Payer: Self-pay | Admitting: *Deleted

## 2016-07-21 ENCOUNTER — Ambulatory Visit
Admission: RE | Admit: 2016-07-21 | Discharge: 2016-07-21 | Disposition: A | Discharge status: Home / Self Care | Payer: Medicare Other | Source: Ambulatory Visit | Attending: Radiation Oncology | Admitting: Radiation Oncology

## 2016-07-21 VITALS — BP 172/68 | HR 57 | Temp 97.8°F

## 2016-07-21 DIAGNOSIS — Z510 Encounter for antineoplastic radiation therapy: Secondary | ICD-10-CM | POA: Diagnosis not present

## 2016-07-21 DIAGNOSIS — C7931 Secondary malignant neoplasm of brain: Secondary | ICD-10-CM

## 2016-07-21 NOTE — Progress Notes (Signed)
  Radiation Oncology         [phone] ________________________________  Name: Guliana Weyandt MRN: [id number]  Date: 07/21/2016  DOB: [birth year]   SPECIAL TREATMENT PROCEDURE   3D TREATMENT PLANNING AND DOSIMETRY: The patient's radiation plan was reviewed and approved by Dr. Cyndy Freeze from neurosurgery and radiation oncology prior to treatment. It showed 3-dimensional radiation distributions overlaid onto the planning CT/MRI image set. The [facility] for the target structures as well as the organs at risk were reviewed. The documentation of the 3D plan and dosimetry are filed in the radiation oncology EMR.   NARRATIVE: The patient was brought to the TrueBeam stereotactic radiation treatment machine and placed supine on the CT couch. The head frame was applied, and the patient was set up for stereotactic radiosurgery. Neurosurgery was present for the set-up and delivery   SIMULATION VERIFICATION: In the couch zero-angle position, the patient underwent Exactrac imaging using the Brainlab system with orthogonal KV images. These were carefully aligned and repeated to confirm treatment position for each of the isocenters. The Exactrac snap film verification was repeated at each couch angle.   SPECIAL TREATMENT PROCEDURE: The patient received stereotactic radiosurgery to the following target:  PTV1 target was treated using 4 Arcs to a prescription dose of 18 Gy. ExacTrac Snap verification was performed for each couch angle.   STEREOTACTIC TREATMENT MANAGEMENT: Following delivery, the patient was transported to nursing in stable condition and monitored for possible acute effects. Vital signs were recorded . The patient tolerated treatment without significant acute effects, and was discharged to home in stable condition.  PLAN: Follow-up in one month.   ------------------------------------------------  Jodelle Gross, MD, PhD

## 2016-07-21 NOTE — Telephone Encounter (Signed)
[place] spoke with pharmacist Brook,  Decadron taper for patient, take '4mg'$  tab oral 2x day x 1 week,then '4mg'$  tab oral daily x 1 week Then 1/2 tab('2mg'$ ) daily x 1 week, then stop,Brook, gave verbal read back,   also printed out decadron taper  To patient, she will call for any signs or symptoms  Not normal for her, patient and son ambulated steady gait upon discharge 1:54 PM  1:54 PM

## 2016-07-21 NOTE — Progress Notes (Signed)
  Radiation Oncology         (336) [phone] ________________________________  Name: Margaret Hale MRN: [id number]  Date: 07/13/2016  DOB: 22-Oct-1934  DIAGNOSIS:     ICD-9-CM ICD-10-CM   1. Brain metastasis ([place]) 198.3 C79.31     NARRATIVE:  The patient was brought to the [facility].  Identity was confirmed.  All relevant records and images related to the planned course of therapy were reviewed.  The patient freely provided informed written consent to proceed with treatment after reviewing the details related to the planned course of therapy. The consent form was witnessed and verified by the simulation staff. Intravenous access was established for contrast administration. Then, the patient was set-up in a stable reproducible supine position for radiation therapy.  A relocatable thermoplastic stereotactic head frame was fabricated for precise immobilization.  CT images were obtained.  Surface markings were placed.  The CT images were loaded into the planning software and fused with the patient's targeting MRI scan.  Then the target and avoidance structures were contoured.  Treatment planning then occurred.  The radiation prescription was entered and confirmed.  I have requested 3D planning  I have requested a DVH of the following structures: Brain stem, brain, left eye, right eye, lenses, optic chiasm, target volumes, uninvolved brain, and normal tissue.    SPECIAL TREATMENT PROCEDURE:  The planned course of therapy using radiation constitutes a special treatment procedure. Special care is required in the management of this patient for the following reasons. This treatment constitutes a Special Treatment Procedure for the following reason: High dose per fraction requiring special monitoring for increased toxicities of treatment including daily imaging.  The special nature of the planned course of radiotherapy will require increased physician supervision and oversight to ensure  patient's safety with optimal treatment outcomes.  PLAN:  The patient will receive 18 Gy in 1 fraction.   ------------------------------------------------  Jodelle Gross, MD, PhD

## 2016-07-21 NOTE — Progress Notes (Addendum)
Patient here for post [facility] monitoring.  She denies having pain, headache, vision changes or nausea.  Will continue to monitor.will give decadron taper

## 2016-07-21 NOTE — Progress Notes (Signed)
Patient   Ambulatory after SRS  Treatment brain, monitor 30 minutes, no nausea, no head ache,

## 2016-07-21 NOTE — Progress Notes (Signed)
The patient was offered enrollment on our single institutional trial investigating open-faced vs closed-face head masks used to immobilize patients during stereotactic brain radiosurgery. The patient has elected to enroll on this trial.  In regards to the trial, the patient has voluntarily signed copies of the consent forms and all trial related questions were answered.    Margaret Hale was randomized to use the brain lab mask. All appropriate information was filled out prior to her SIM and given to [facility] for documentation.   Mont Dutton R.T.(R)(T) Special Procedures Navigator

## 2016-08-07 ENCOUNTER — Ambulatory Visit: Payer: Medicare Other | Admitting: Nurse Practitioner

## 2016-08-07 ENCOUNTER — Ambulatory Visit (HOSPITAL_BASED_OUTPATIENT_CLINIC_OR_DEPARTMENT_OTHER): Payer: Medicare Other | Admitting: Nurse Practitioner

## 2016-08-07 ENCOUNTER — Other Ambulatory Visit: Payer: Medicare Other

## 2016-08-07 ENCOUNTER — Telehealth: Payer: Self-pay | Admitting: Oncology

## 2016-08-07 ENCOUNTER — Encounter: Payer: Self-pay | Admitting: Radiation Oncology

## 2016-08-07 VITALS — BP 127/66 | HR 65 | Temp 99.5°F | Resp 18 | Wt 189.2 lb

## 2016-08-07 VITALS — BP 127/66 | HR 65 | Temp 99.5°F | Resp 18 | Ht 64.0 in | Wt 189.4 lb

## 2016-08-07 DIAGNOSIS — I10 Essential (primary) hypertension: Secondary | ICD-10-CM

## 2016-08-07 DIAGNOSIS — C7931 Secondary malignant neoplasm of brain: Secondary | ICD-10-CM | POA: Diagnosis not present

## 2016-08-07 DIAGNOSIS — C3490 Malignant neoplasm of unspecified part of unspecified bronchus or lung: Secondary | ICD-10-CM

## 2016-08-07 DIAGNOSIS — R911 Solitary pulmonary nodule: Secondary | ICD-10-CM | POA: Diagnosis not present

## 2016-08-07 DIAGNOSIS — K1370 Unspecified lesions of oral mucosa: Secondary | ICD-10-CM | POA: Diagnosis not present

## 2016-08-07 DIAGNOSIS — R5383 Other fatigue: Secondary | ICD-10-CM | POA: Diagnosis not present

## 2016-08-07 DIAGNOSIS — C3491 Malignant neoplasm of unspecified part of right bronchus or lung: Secondary | ICD-10-CM

## 2016-08-07 DIAGNOSIS — Z95828 Presence of other vascular implants and grafts: Secondary | ICD-10-CM

## 2016-08-07 MED ORDER — SODIUM CHLORIDE 0.9 % IJ SOLN
10.0000 mL | INTRAMUSCULAR | Status: DC | PRN
  Administered 2016-08-07: 10 mL via INTRAVENOUS
  Filled 2016-08-07: qty 10

## 2016-08-07 MED ORDER — HEPARIN SOD (PORK) LOCK FLUSH 100 UNIT/ML IV SOLN
500.0000 [IU] | Freq: Once | INTRAVENOUS | Status: AC | PRN
  Administered 2016-08-07: 500 [IU] via INTRAVENOUS
  Filled 2016-08-07: qty 5

## 2016-08-07 NOTE — Patient Instructions (Signed)
Implanted Port Home Guide An implanted port is a type of central line that is placed under the skin. Central lines are used to provide IV access when treatment or nutrition needs to be given through a person's veins. Implanted ports are used for long-term IV access. An implanted port may be placed because:  You need IV medicine that would be irritating to the small veins in your hands or arms.  You need long-term IV medicines, such as antibiotics.  You need IV nutrition for a long period.  You need frequent blood draws for lab tests.  You need dialysis.  Implanted ports are usually placed in the chest area, but they can also be placed in the upper arm, the abdomen, or the leg. An implanted port has two main parts:  Reservoir. The reservoir is round and will appear as a small, raised area under your skin. The reservoir is the part where a needle is inserted to give medicines or draw blood.  Catheter. The catheter is a thin, flexible tube that extends from the reservoir. The catheter is placed into a large vein. Medicine that is inserted into the reservoir goes into the catheter and then into the vein.  How will I care for my incision site? Do not get the incision site wet. Bathe or shower as directed by your health care provider. How is my port accessed? Special steps must be taken to access the port:  Before the port is accessed, a numbing cream can be placed on the skin. This helps numb the skin over the port site.  Your health care provider uses a sterile technique to access the port. ? Your health care provider must put on a mask and sterile gloves. ? The skin over your port is cleaned carefully with an antiseptic and allowed to dry. ? The port is gently pinched between sterile gloves, and a needle is inserted into the port.  Only "non-coring" port needles should be used to access the port. Once the port is accessed, a blood return should be checked. This helps ensure that the port  is in the vein and is not clogged.  If your port needs to remain accessed for a constant infusion, a clear (transparent) bandage will be placed over the needle site. The bandage and needle will need to be changed every week, or as directed by your health care provider.  Keep the bandage covering the needle clean and dry. Do not get it wet. Follow your health care provider's instructions on how to take a shower or bath while the port is accessed.  If your port does not need to stay accessed, no bandage is needed over the port.  What is flushing? Flushing helps keep the port from getting clogged. Follow your health care provider's instructions on how and when to flush the port. Ports are usually flushed with saline solution or a medicine called heparin. The need for flushing will depend on how the port is used.  If the port is used for intermittent medicines or blood draws, the port will need to be flushed: ? After medicines have been given. ? After blood has been drawn. ? As part of routine maintenance.  If a constant infusion is running, the port may not need to be flushed.  How long will my port stay implanted? The port can stay in for as long as your health care provider thinks it is needed. When it is time for the port to come out, surgery will be   done to remove it. The procedure is similar to the one performed when the port was put in. When should I seek immediate medical care? When you have an implanted port, you should seek immediate medical care if:  You notice a bad smell coming from the incision site.  You have swelling, redness, or drainage at the incision site.  You have more swelling or pain at the port site or the surrounding area.  You have a fever that is not controlled with medicine.  This information is not intended to replace advice given to you by your health care provider. Make sure you discuss any questions you have with your health care provider. Document  Released: 05/15/2005 Document Revised: 10/21/2015 Document Reviewed: 01/20/2013 Elsevier Interactive Patient Education  2017 [employer]  

## 2016-08-07 NOTE — Telephone Encounter (Signed)
Gave patient AVS and scheduled appts per 08/07/2016 los.

## 2016-08-07 NOTE — Progress Notes (Signed)
  Radiation Oncology         [phone] ________________________________  Name: Margaret Hale MRN: [id number]  Date: 08/07/2016  DOB: 1934-08-29  End of Treatment Note  Diagnosis:   Extensive Stage small cell lung cancer with brain metastasis     Indication for treatment:  Curative       Radiation treatment dates:   07/21/16  Site/dose:   PTV1 sup ver 25 mm/ 18 Gy in 1 fraction  Beams/energy:   SBRT SRT-3D / 6FFF  Narrative: The patient tolerated radiation treatment relatively well. During treatment, patient denies nausea or headaches.  Plan: The patient has completed radiation treatment. The patient will return to radiation oncology clinic for routine followup in one month. I advised them to call or return sooner if they have any questions or concerns related to their recovery or treatment.  ------------------------------------------------  Jodelle Gross, MD, PhD

## 2016-08-07 NOTE — Progress Notes (Addendum)
[place] OFFICE PROGRESS NOTE   Diagnosis:  Small cell carcinoma  INTERVAL HISTORY:   Margaret Hale returns as scheduled. She completed SRS to a cerebellar metastasis on 07/21/2016. Her main complaint today is fatigue. This is being going on for the past one to 2 months. She reports she is scheduled to have thyroid testing done later this month. She denies any fever. No sweats. No chills. She denies shortness of breath. She reports a good appetite. Her weight is stable. She intermittently notes bilateral leg pain. She reports several mouth sores at the lower gumline.  Objective:  Vital signs in last 24 hours:  Blood pressure 127/66, pulse 65, temperature 99.5 F (37.5 C), resp. rate 18, weight 189 lb 4 oz (85.8 kg).    HEENT: No thrush or ulcers. Mucous membranes are moist. Lymphatics: No palpable cervical, supra clavicular, axillary or inguinal lymph nodes. Resp: Scattered end inspiratory rhonchi. No respiratory distress. Cardio: Regular rate and rhythm. GI: Abdomen is soft and nontender. No organomegaly. Vascular: No leg edema. Calves soft and nontender. Neuro: Alert and oriented.  Skin: No rash.    Lab Results:  Lab Results  Component Value Date   WBC 5.2 12/28/2015   HGB 13.9 12/28/2015   HCT 42.2 12/28/2015   MCV 91.1 12/28/2015   PLT 155 12/28/2015   NEUTROABS 3.1 12/28/2015    Imaging:  No results found.  Medications: I have reviewed the patient's current medications.  Assessment/Plan: 1. Extensive stage small cell lung cancer ? resection of right temporal brain mass 06/09/2015 confirmed metastatic small cell carcinoma ? Staging PET scan at [facility] 07/22/2015 revealed decreased activity associated with previously seen pulmonary nodules , decreased activity of hypermetabolic mediastinal and bilateral hilar nodes , no new sites of FDG avid metastatic disease compared to a [facility] PET scan from November 2016  Cycle 1 etoposide/carboplatin  08/16/2015  Cycle 2 etoposide/carboplatin 09/14/2015  Cycle 3 etoposide/carboplatin 10/19/2015  Restaging chest CT 11/12/2015 with generally stable appearance of solid, sub-solid and ground glass pulmonary nodules bilaterally. Stable mildly prominent mediastinal lymph nodes. No progressive adenopathy or new findings.  Cycle 4 etoposide/carboplatin 11/16/2015   Brain MRI 01/24/2016-new 4 mm superior vermis metastasis  CT chest 06/07/2016-stable solid/sub-solid pulmonary nodules, stable mediastinal lymph nodes, no evidence of progressive disease  MRI brain 07/05/2016-progression of the cerebellar metastasis with associated edema and effacement of the upper fourth ventricle   SRS to cerebellar lesion 07/21/2016  2. Chronic history of mediastinal lymphadenopathy and pulmonary nodules-diagnostic workup in 2013 including bronchoscopy/EBUS , and mediastinoscopy -negative for malignancy, , granulomatous changes noted  3. Hypertension  4. History of CAD  5. "Sweats "-most likely related to post menopausal hot flashes  6. Port-A-Cath placement 10/26/2015    Disposition: Margaret Hale appears stable. She completed SRS to the cerebellar metastasis 07/21/2016. There is no clinical evidence of disease progression.  She is fatigued. We discussed that this may be related to radiation or possibly to her thyroid. She is scheduled to have thyroid labs done by her PCP later this month.  She notes "soreness" at the bilateral posterior lower gumline. No ulcerations on exam. We made a referral to Dr. Enrique Sack.  She will return for a follow-up visit and Port-A-Cath flush in 6 weeks. She will contact the office in the interim with any problems.   Patient seen with Dr. Benay Spice.   Ned Card ANP/GNP-BC   08/07/2016  2:35 PM  This was a shared visit with Ned Card. Margaret Hale was interviewed and  examined. There are no apparent ulcers or thrush on examination of the  oral cavity today. She complains of soreness at the lower gumline bilaterally. We will make a dental medicine referral.  Julieanne Manson, M.D.

## 2016-08-07 NOTE — Progress Notes (Signed)
RN visit only for port flush

## 2016-08-08 NOTE — Progress Notes (Addendum)
Margaret Hale 82 y.o. woman with Extensive Stage small cell lung cancer with brain metastasis  completed radiation 07-21-16 one month FU.   Headache:None Pain:7/10 muscle in legs taking Ibuprofen and cold sore to lips 9/10 Dizziness:None Nausea/Vomiting/Ataxia:None Visional changes(Blurred/Diplopia (double vision), blind spots and peripheral vision changes):None Ring in ears:None Fatigue:Having fatigue all the time, taking naps. Cognitive changes: Does not feel that she is having any short term memory recall or long term memory problems. She was able to answer all of my questions without any difficulty this morning. Weight: Wt Readings from Last 3 Encounters:  08/21/16 188 lb (85.3 kg)  08/07/16 189 lb 4 oz (85.8 kg)  08/07/16 189 lb 6.4 oz (85.9 kg)   Appetite: Eating fair two meals a day, eats fruit for a snack. Respiratory complaints:SOB with exertion, coughing dry to yellowish mucus since January. Decadron 4 mg completed Imaging:None since 07-05-16 JIZ:XYOF BP (!) 146/67   Pulse 60   Temp 97.9 F (36.6 C) (Oral)   Resp 18   Ht '5\' 4"'$  (1.626 m)   Wt 188 lb (85.3 kg)   SpO2 97%   BMI 32.27 kg/m

## 2016-08-21 ENCOUNTER — Encounter: Payer: Self-pay | Admitting: Radiation Oncology

## 2016-08-21 ENCOUNTER — Ambulatory Visit
Admission: RE | Admit: 2016-08-21 | Discharge: 2016-08-21 | Disposition: A | Discharge status: Home / Self Care | Payer: Medicare Other | Source: Ambulatory Visit | Attending: Radiation Oncology | Admitting: Radiation Oncology

## 2016-08-21 VITALS — BP 146/67 | HR 60 | Temp 97.9°F | Resp 18 | Ht 64.0 in | Wt 188.0 lb

## 2016-08-21 DIAGNOSIS — Z79899 Other long term (current) drug therapy: Secondary | ICD-10-CM | POA: Diagnosis not present

## 2016-08-21 DIAGNOSIS — C7931 Secondary malignant neoplasm of brain: Secondary | ICD-10-CM | POA: Diagnosis present

## 2016-08-21 DIAGNOSIS — Z7982 Long term (current) use of aspirin: Secondary | ICD-10-CM | POA: Diagnosis not present

## 2016-08-21 DIAGNOSIS — C3490 Malignant neoplasm of unspecified part of unspecified bronchus or lung: Secondary | ICD-10-CM | POA: Diagnosis present

## 2016-08-21 DIAGNOSIS — R05 Cough: Secondary | ICD-10-CM | POA: Insufficient documentation

## 2016-08-21 DIAGNOSIS — Z923 Personal history of irradiation: Secondary | ICD-10-CM | POA: Insufficient documentation

## 2016-08-21 DIAGNOSIS — C3491 Malignant neoplasm of unspecified part of right bronchus or lung: Secondary | ICD-10-CM

## 2016-08-21 NOTE — Addendum Note (Signed)
Encounter addended by: Malena Edman, RN on: 08/21/2016  3:16 PM<BR>    Actions taken: Charge Capture section accepted

## 2016-08-21 NOTE — Progress Notes (Signed)
Radiation Oncology         [phone] ________________________________  Name: Margaret Hale MRN: [id number]  Date: 08/21/2016  DOB: Sep 04, 1934  Post Treatment Note  CC: [name], MD  Curt Bears, MD  Diagnosis:   Extensive Stage Small Cell Carcinoma of the lung with brain metastasis.  Interval Since Last Radiation: 5 weeks   07/21/16:  PTV1 sup ver 25 mm/ 18 Gy in 1 fraction  Narrative:  The patient returns today for routine follow-up after undergoing SRS to the brain. Her history has been somewhat complicated and she was encouraged to proceed with whole brain radiation however declined due to overwhelming concerns with cognitive decline if she were to go this route. She had repeat imaging later than the recommended interval due to her interest and avoiding as much imaging at that time. She subsequently been treated for one lesion of the brain with SRS, but does understand the rationale to consider whole brain radiotherapy if her disease progresses.                           On review of systems, the patient states she's been doing okay since her radiation completed, and last few months, she has noticed increasing fatigue which is attributed to a combination of hypothyroidism, in addition to brain disease in radiotherapy. She is tapered off of the steroids at this time, and reports that she is doing pretty well other than the fatigue she is having. She does note that she has had some tightness in her calves bilaterally, and that this comes on any time of day without pattern. She also describes a cough that is mostly nonproductive but occasionally productive with mucous at multiple times during the day. She states that this is, since November, and this was at the time of an upper respiratory illness. She reports however that despite resolution of her fever, rhinorrhea and productive cough, her dry cough persist. She is also taking Diovan. She denies any headaches, visual or  auditory disturbances. No other complaints or verbalized  ALLERGIES:  is allergic to food; lipitor [atorvastatin calcium]; tape; and pantoprazole sodium.  Meds: Current Outpatient Prescriptions  Medication Sig Dispense Refill  . amLODipine (NORVASC) 10 MG tablet Take 10 mg by mouth every evening.     Marland Kitchen aspirin 81 MG tablet Take 81 mg by mouth daily.    . hydrochlorothiazide (HYDRODIURIL) 25 MG tablet Take 25 mg by mouth daily.    Marland Kitchen ibuprofen (ADVIL,MOTRIN) 200 MG tablet Take 200 mg by mouth every 6 (six) hours as needed.    . metoprolol succinate (TOPROL-XL) 100 MG 24 hr tablet Take 100 mg by mouth daily. Take with or immediately following a meal.    . Omega-3 Fatty Acids (FISH OIL PO) Take 4,000 Units by mouth 2 (two) times daily.    Marland Kitchen rOPINIRole (REQUIP) 1 MG tablet Take 1 mg by mouth at bedtime.    . triamcinolone cream (KENALOG) 0.1 %     . valsartan (DIOVAN) 320 MG tablet Take 320 mg by mouth daily.    Marland Kitchen lidocaine-prilocaine (EMLA) cream Apply 1 application topically as needed. Apply to port site 1 hr prior to port access and cover with plastic wrap (Patient not taking: Reported on 08/21/2016) 30 g 0   No current facility-administered medications for this encounter.     Physical Findings:  height is '5\' 4"'$  (1.626 m) and weight is 188 lb (85.3 kg). Her oral temperature is  97.9 F (36.6 C). Her blood pressure is 146/67 (abnormal) and her pulse is 60. Her respiration is 18 and oxygen saturation is 97%.  Pain Assessment Pain Score: 7  ( 9, Lips cold sore, legs 7)/10 In general this is a well appearing [place] European Female in no acute distress. She's alert and oriented x4 and appropriate throughout the examination. She appears to be grossly neurologically intact, without disturbance. Lower extremities are negative for pretibial pitting edema or deep calf tenderness. Chest is clear to auscultation bilaterally and cardiovascular exam reveals a regular rate and rhythm, no clicks rubs or  murmurs are auscultated.   Lab Findings: Lab Results  Component Value Date   WBC 5.2 12/28/2015   HGB 13.9 12/28/2015   HCT 42.2 12/28/2015   MCV 91.1 12/28/2015   PLT 155 12/28/2015     Radiographic Findings: No results found.  Impression/Plan: 1. Extensive stage small cell carcinoma of the lung with brain metastasis. The patient has completed SRS therapy. Again we have stressed the importance of considering whole brain radiotherapy which she has been counseled on at several intervals. If she has any progressive CNS disease in the future, we will offer whole brain again. She does understand that there is still a very high likelihood of additional brain disease based on her lung cancer histology. We will proceed with repeat MRI in 3 months, and she will keep [place] informed of any questions or concerns prior to her next visit. 2. Fatigue. This appears to be multifactorial. We will continue to follow this expectantly counseled her on light exercise as a mean of combating her fatigue. She will also follow-up with her primary provider regarding her concerns for hypothyroidism.  3. Dry cough. I looked back at the patient's chart and she had a scan was stable in January 2018 of the chest, therefore this is less likely due to her lung disease, and may be suspicious for angiotensin receptor blocker cough. She will touch base with her primary provider regarding this. 4. Tightness. The patient will also talk with her primary provider regarding this issue. Although fatigue and lower extremity weakness could be due to her recent use of steroids, this does not appear to be consistent with her radiotherapy.    Carola Rhine, PAC

## 2016-09-01 ENCOUNTER — Other Ambulatory Visit: Payer: Self-pay | Admitting: Radiation Therapy

## 2016-09-01 DIAGNOSIS — C7931 Secondary malignant neoplasm of brain: Secondary | ICD-10-CM

## 2016-09-12 ENCOUNTER — Telehealth: Payer: Self-pay | Admitting: *Deleted

## 2016-09-12 NOTE — Telephone Encounter (Signed)
"  This is [place] with [employer] calling to assist a patient of Dr. Gearldine Shown with a cancer policy.  We need the diagnosis codes and the dates the brain and lung cancers were were diagnosed.  She says she received chemotherapy and surgery.  We've been unable to reach the surgeon.  Maybe you all have information in your records."  Provided lung cancer diagnosis code (C34.91) and brain diagnosis code (C79.31).  First chemotherapy at [facility] was 08-16-2015.  Negative for lung cancer that was suspected in 2013 was diagnosed December 2016.  04-29-2015 MRI brain followed with surgery (Rt. Frontal temporal parietal craniotomy) on 06-09-2015 by Dr. Arnoldo Morale. "Thank you for this information to help me get started."  Denies further questions or needs at this time.

## 2016-09-18 ENCOUNTER — Ambulatory Visit (HOSPITAL_BASED_OUTPATIENT_CLINIC_OR_DEPARTMENT_OTHER): Payer: Medicare Other

## 2016-09-18 ENCOUNTER — Telehealth: Payer: Self-pay | Admitting: Oncology

## 2016-09-18 ENCOUNTER — Ambulatory Visit (HOSPITAL_BASED_OUTPATIENT_CLINIC_OR_DEPARTMENT_OTHER): Payer: Medicare Other | Admitting: Oncology

## 2016-09-18 VITALS — BP 139/62 | HR 60 | Temp 98.1°F | Resp 18 | Ht 64.0 in | Wt 187.4 lb

## 2016-09-18 DIAGNOSIS — C3491 Malignant neoplasm of unspecified part of right bronchus or lung: Secondary | ICD-10-CM | POA: Diagnosis not present

## 2016-09-18 DIAGNOSIS — C7931 Secondary malignant neoplasm of brain: Secondary | ICD-10-CM | POA: Diagnosis not present

## 2016-09-18 DIAGNOSIS — R5381 Other malaise: Secondary | ICD-10-CM

## 2016-09-18 DIAGNOSIS — I10 Essential (primary) hypertension: Secondary | ICD-10-CM | POA: Diagnosis not present

## 2016-09-18 DIAGNOSIS — Z95828 Presence of other vascular implants and grafts: Secondary | ICD-10-CM

## 2016-09-18 MED ORDER — SODIUM CHLORIDE 0.9 % IJ SOLN
10.0000 mL | INTRAMUSCULAR | Status: DC | PRN
  Administered 2016-09-18: 10 mL via INTRAVENOUS
  Filled 2016-09-18: qty 10

## 2016-09-18 MED ORDER — HEPARIN SOD (PORK) LOCK FLUSH 100 UNIT/ML IV SOLN
500.0000 [IU] | Freq: Once | INTRAVENOUS | Status: AC | PRN
  Administered 2016-09-18: 500 [IU] via INTRAVENOUS
  Filled 2016-09-18: qty 5

## 2016-09-18 NOTE — Progress Notes (Signed)
  [place] OFFICE PROGRESS NOTE   Diagnosis: Small cell lung cancer  INTERVAL HISTORY:   She returns as scheduled. She continues to have malaise. Good appetite. She mows her yard and works in her garden. Her cough has improved. No dyspnea. No headache.  Objective:  Vital signs in last 24 hours:  Blood pressure 139/62, pulse 60, temperature 98.1 F (36.7 C), temperature source Oral, resp. rate 18, height '5\' 4"'$  (1.626 m), weight 187 lb 6.4 oz (85 kg), SpO2 98 %.    HEENT: Neck without mass Lymphatics: No cervical, supraclavicular, or axillary nodes Resp: Lungs with end inspiratory rhonchi at the left posterior base, no respiratory distress Cardio: Regular rate and rhythm GI: No hepatosplenomegaly Vascular: No leg edema     Medications: I have reviewed the patient's current medications.  Assessment/Plan: 1. Extensive stage small cell lung cancer  resection of right temporal brain mass 06/09/2015 confirmed metastatic small cell carcinoma  Staging [facility] 07/22/2015 revealed decreased activity associated with previously seen pulmonary nodules , decreased activity of hypermetabolic mediastinal and bilateral hilar nodes , no new sites of FDG avid metastatic disease compared to a [facility] PET scan from November 2016  Cycle 1 etoposide/carboplatin 08/16/2015  Cycle 2 etoposide/carboplatin 09/14/2015  Cycle 3 etoposide/carboplatin 10/19/2015  Restaging chest CT 11/12/2015 with generally stable appearance of solid, sub-solid and ground glass pulmonary nodules bilaterally. Stable mildly prominent mediastinal lymph nodes. No progressive adenopathy or new findings.  Cycle 4 etoposide/carboplatin 11/16/2015   Brain MRI 01/24/2016-new 4 mm superior vermis metastasis  CT chest 06/07/2016-stable solid/sub-solid pulmonary nodules, stable mediastinal lymph nodes, no evidence of progressive disease  MRI brain 07/05/2016-progression of the cerebellar  metastasis with associated edema and effacement of the upper fourth ventricle   SRS to cerebellar lesion 07/21/2016  2. Chronic history of mediastinal lymphadenopathy and pulmonary nodules-diagnostic workup in 2013 including bronchoscopy/EBUS , and mediastinoscopy -negative for malignancy, , granulomatous changes noted  3. Hypertension  4. History of CAD  5. "Sweats "-most likely related to post menopausal hot flashes  6. Port-A-Cath placement 10/26/2015   Disposition:  She continues to complain of malaise. There is no clinical evidence for progression of the small cell lung cancer. She is scheduled for a restaging brain MRI next month. She will return for an office visit and Port-A-Cath flush 10/25/2016.  She reports her thyroid function has been checked by her primary physician. We we will consider additional laboratory evaluation for the malaise, but there is no symptom to suggest a cancer related problem at present. The malaise could be related to polypharmacy, specifically the beta blocker therapy.  15 minutes were spent with the patient today. The majority of the time was used for counseling and coordination of care.  Betsy Coder, MD  09/18/2016  4:38 PM

## 2016-09-18 NOTE — Telephone Encounter (Signed)
Appointments scheduled per 09/18/16 los. Patient was given a copy of the AVS report and appointment schedule per 09/18/16 los.

## 2016-09-18 NOTE — Patient Instructions (Signed)
Implanted Port Home Guide An implanted port is a type of central line that is placed under the skin. Central lines are used to provide IV access when treatment or nutrition needs to be given through a person's veins. Implanted ports are used for long-term IV access. An implanted port may be placed because:  You need IV medicine that would be irritating to the small veins in your hands or arms.  You need long-term IV medicines, such as antibiotics.  You need IV nutrition for a long period.  You need frequent blood draws for lab tests.  You need dialysis.  Implanted ports are usually placed in the chest area, but they can also be placed in the upper arm, the abdomen, or the leg. An implanted port has two main parts:  Reservoir. The reservoir is round and will appear as a small, raised area under your skin. The reservoir is the part where a needle is inserted to give medicines or draw blood.  Catheter. The catheter is a thin, flexible tube that extends from the reservoir. The catheter is placed into a large vein. Medicine that is inserted into the reservoir goes into the catheter and then into the vein.  How will I care for my incision site? Do not get the incision site wet. Bathe or shower as directed by your health care provider. How is my port accessed? Special steps must be taken to access the port:  Before the port is accessed, a numbing cream can be placed on the skin. This helps numb the skin over the port site.  Your health care provider uses a sterile technique to access the port. ? Your health care provider must put on a mask and sterile gloves. ? The skin over your port is cleaned carefully with an antiseptic and allowed to dry. ? The port is gently pinched between sterile gloves, and a needle is inserted into the port.  Only "non-coring" port needles should be used to access the port. Once the port is accessed, a blood return should be checked. This helps ensure that the port  is in the vein and is not clogged.  If your port needs to remain accessed for a constant infusion, a clear (transparent) bandage will be placed over the needle site. The bandage and needle will need to be changed every week, or as directed by your health care provider.  Keep the bandage covering the needle clean and dry. Do not get it wet. Follow your health care provider's instructions on how to take a shower or bath while the port is accessed.  If your port does not need to stay accessed, no bandage is needed over the port.  What is flushing? Flushing helps keep the port from getting clogged. Follow your health care provider's instructions on how and when to flush the port. Ports are usually flushed with saline solution or a medicine called heparin. The need for flushing will depend on how the port is used.  If the port is used for intermittent medicines or blood draws, the port will need to be flushed: ? After medicines have been given. ? After blood has been drawn. ? As part of routine maintenance.  If a constant infusion is running, the port may not need to be flushed.  How long will my port stay implanted? The port can stay in for as long as your health care provider thinks it is needed. When it is time for the port to come out, surgery will be   done to remove it. The procedure is similar to the one performed when the port was put in. When should I seek immediate medical care? When you have an implanted port, you should seek immediate medical care if:  You notice a bad smell coming from the incision site.  You have swelling, redness, or drainage at the incision site.  You have more swelling or pain at the port site or the surrounding area.  You have a fever that is not controlled with medicine.  This information is not intended to replace advice given to you by your health care provider. Make sure you discuss any questions you have with your health care provider. Document  Released: 05/15/2005 Document Revised: 10/21/2015 Document Reviewed: 01/20/2013 Elsevier Interactive Patient Education  2017 [employer]  

## 2016-09-19 ENCOUNTER — Telehealth: Payer: Self-pay | Admitting: *Deleted

## 2016-09-19 NOTE — Telephone Encounter (Signed)
Called pt, per Dr. Benay Spice: Discuss BP meds with Dr. Dema Severin. Some people get fatigue while on beta blockers. Pt voiced appreciation for call. She plans to schedule PCP visit soon.

## 2016-10-18 NOTE — Progress Notes (Signed)
Margaret Hale 82 y.o. woman with extensive stage small cell carcinoma of the lung with brain metastasis SRS brain radiation completed 07-21-16, review 10-20-16 MRI brain, 3 month FU.  PAIN: She  is currently is having pain in her right hip down her leg and muscles 8/10 taking Ibuprofen with some relief. NEURO:Alert and oriented x 3 with fluent speech,able to complete sentences without difficulty with word finding or organization of sentences.  Forgetful at times. Denies any visual disturbance,blurred vision,double vision,blind spots or peripheral vision changes. Denies reports any nausea or vomiting,ataxia,ring of ears,dizziness or headache Fine motor movement-picking up objects with fingers,holding objects, writing, weakness of lower extremities:  Reports muscle and leg pain and numbness in hands during the night. Aphasia/Slurred speech:No problems. SKIN: Warm and dry.  Reports small raised pimple like areas on skin face,arms and hips, legsover the passed month causes itching , one area on the upper lip for two months using abreva to the area. Imaging:10-20-16 MRI brain w wo contrast Labs:N/A  OTHER: Pt complains of skin raised pimple like areas since completing her radiation. Wt Readings from Last 3 Encounters:  10/25/16 187 lb (84.8 kg)  09/18/16 187 lb 6.4 oz (85 kg)  08/21/16 188 lb (85.3 kg)  BP (!) 141/68   Pulse 62   Temp 98.8 F (37.1 C) (Oral)   Resp 18   Ht 5\' 4"  (1.626 m)   Wt 187 lb (84.8 kg)   SpO2 98%   BMI 32.10 kg/m

## 2016-10-20 ENCOUNTER — Ambulatory Visit (HOSPITAL_COMMUNITY)
Admission: RE | Admit: 2016-10-20 | Discharge: 2016-10-20 | Disposition: A | Discharge status: Home / Self Care | Payer: Medicare Other | Source: Ambulatory Visit | Attending: Radiation Oncology | Admitting: Radiation Oncology

## 2016-10-20 DIAGNOSIS — C7931 Secondary malignant neoplasm of brain: Secondary | ICD-10-CM | POA: Insufficient documentation

## 2016-10-20 DIAGNOSIS — Z923 Personal history of irradiation: Secondary | ICD-10-CM | POA: Insufficient documentation

## 2016-10-20 LAB — CREATININE, SERUM
Creatinine, Ser: 0.77 mg/dL (ref 0.44–1.00)
GFR calc Af Amer: >60 mL/min (ref >60)

## 2016-10-20 MED ORDER — HEPARIN SOD (PORK) LOCK FLUSH 100 UNIT/ML IV SOLN
500.0000 [IU] | INTRAVENOUS | Status: AC | PRN
  Administered 2016-10-20: 500 [IU]

## 2016-10-24 NOTE — Progress Notes (Addendum)
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  Interval Since Last Radiation: 3 months   07/21/16:  PTV1 sup ver 25 mm/ 18 Gy in 1 fraction  Narrative:  The patient returns today for routine follow-up after undergoing SRS to the brain. Her history has been somewhat complicated and she was encouraged to proceed with whole brain radiation however declined due to overwhelming concerns with cognitive decline if she were to go this route. She had repeat imaging later than the recommended interval due to her interest and avoiding as much imaging at that time. She subsequently been treated for one lesion of the brain with SRS, but does understand the rationale to consider whole brain radiotherapy if her disease progresses.  She has agreed to surveillance MRIs and had this performed on 10/20/16 revealing significant improment in her treated disease now measuring 9 x 10 mm, previously 23 x 25 mm. A punctate focus was noted as well in the right temporal lobe felt to be consistent with pulsation artifact of a vessel. Recommendations from conference were to proceed with repeat imaging in 3 months time.                         On review of systems, the patient reports that she continues to have fatigue and is frustrated by the lack of stamina that she's noticed since her radiation and chemotherapy treatments. She seems to think that this is been more noticeable since radiation specifically. She is still able to her yard, to get out into her shopping and take care of her home. She does discuss her symptoms previously as well with Dr. Benay Spice to believe that these are multifactorial and likely also due to her other comorbidities particularly COPD. She denies  any chest pain, shortness of breath, cough, fevers, chills, night sweats, unintended weight changes. She denies any bowel or bladder disturbances, and denies abdominal pain, nausea or vomiting. She denies any new musculoskeletal or joint aches or pains, new skin lesions or concerns. She reports that she has had some more breakdowns in her skin particularly around her mouth, she is using over-the-counter antiviral therapy for colon source. She states this is not really helped, she has not been seen by a dermatologist at this point. A complete review of systems is obtained and is otherwise negative.  Past Medical History:  Past Medical History:  Diagnosis Date  . Anxiety   . Baker's cyst    L KNEE  . CAD (coronary artery disease) 2007   PCI of RCA, bare metal stent.....Dr. Radford Pax  . Cancer (HCC)    lung mass  . Disc disorder of lumbar region    2 BULDGING DISCS DX 1990 BY DR. DALLDORF  . Dizzy   . Hyperlipidemia   . Hypertension    Takes Novasc ,Clonidine, & Losartan  . No pertinent past medical history    lung mass  occas cough   . Thyroid disease    nodules,stable on u/s 2/12  . Vitamin D deficiency     Past Surgical History: Past Surgical History:  Procedure Laterality Date  . CARPAL TUNNEL RELEASE  06/26/08        04/20/09   RIGHT HAND  LEFT HAND  . CORONARY ANGIOPLASTY     dr h Tamala Julian [phone] with one stent  . CRANIOTOMY Right 06/09/2015   Procedure: CRANIOTOMY TUMOR EXCISION WITH APPLICATION OF CRANIAL NAVIGATION;  Surgeon: Newman Pies, MD;  Location: [facility];  Service: Neurosurgery;  Laterality: Right;  Right temporal craniotomy for resection of tumor  . EYE SURGERY  01/07/09   LEFT CATARACT  . EYE SURGERY  01/21/09   RIGHT CATARACT  . FINGER CONTRACTURE RELEASE  8/12    Dr. Amedeo Plenty   right hand trigger finger release  . MEDIASTINOSCOPY  08/02/2011   Procedure: MEDIASTINOSCOPY;  Surgeon: Nicanor Alcon, MD;  Location: [facility];  Service: Thoracic;  Laterality:  N/A;  . right power port placement Right     Social History:  Social History   Social History  . Marital status: Widowed    Spouse name: N/A  . Number of children: 2  . Years of education: N/A   Occupational History  . Retired    Social History Main Topics  . Smoking status: Former Smoker    Packs/day: 0.50    Years: 10.00    Types: Cigarettes    Quit date: 07/12/2005  . Smokeless tobacco: Never Used  . Alcohol use No  . Drug use: No  . Sexual activity: Not on file   Other Topics Concern  . Not on file   Social History Narrative  . No narrative on file    Family History: Family History  Problem Relation Age of Onset  . Anesthesia problems Neg Hx   . Hypotension Neg Hx   . Malignant hyperthermia Neg Hx   . Pseudochol deficiency Neg Hx      ALLERGIES:  is allergic to food; lipitor [atorvastatin calcium]; tape; and pantoprazole sodium.  Meds: Current Outpatient Prescriptions  Medication Sig Dispense Refill  . amLODipine (NORVASC) 10 MG tablet Take 10 mg by mouth every evening.     Marland Kitchen aspirin 81 MG tablet Take 81 mg by mouth daily.    . hydrochlorothiazide (HYDRODIURIL) 25 MG tablet Take 25 mg by mouth daily.    Marland Kitchen ibuprofen (ADVIL,MOTRIN) 200 MG tablet Take 200 mg by mouth every 6 (six) hours as needed.    . labetalol (NORMODYNE) 100 MG tablet Take 100 mg by mouth daily.    Marland Kitchen lidocaine-prilocaine (EMLA) cream Apply 1 application topically as needed. Apply to port site 1 hr prior to port access and cover with plastic wrap 30 g 0  . metoprolol succinate (TOPROL-XL) 100 MG 24 hr tablet Take 100 mg by mouth daily. Take with or immediately following a meal.    . Omega-3 Fatty Acids (FISH OIL PO) Take 4,000 Units by mouth 2 (two) times daily.    Marland Kitchen rOPINIRole (REQUIP) 1 MG tablet Take 1 mg by mouth at bedtime.    . triamcinolone cream (KENALOG) 0.1 %     . valsartan (DIOVAN) 320 MG tablet Take 320 mg by mouth daily.    . RESTASIS 0.05 % ophthalmic emulsion      No  current facility-administered medications for this encounter.     Physical Findings:  height is 5\' 4"  (1.626 m) and weight is 187 lb (84.8 kg). Her oral temperature is 98.8 F (37.1 C). Her blood pressure is 141/68 (abnormal) and her pulse is 62. Her respiration is 18 and oxygen saturation is 98%.  Pain Assessment Pain Score: 8  (Right hip down right leg)/10 In general this is a well appearing [place]  European Female in no acute distress. She's alert and oriented x4 and appropriate throughout the examination. She appears to be grossly neurologically intact, without disturbance. Lower extremities are negative for pretibial pitting edema or deep calf tenderness. Chest is clear to auscultation bilaterally and cardiovascular exam reveals a regular rate and rhythm, no clicks rubs or murmurs are auscultated. Her skin is intact and reveals several areas L of macular papules along the forehead, nasal labial folds. This is nonspecific, and does not appear to be active pustules or vesicles.  Lab Findings: Lab Results  Component Value Date   WBC 5.2 12/28/2015   HGB 13.9 12/28/2015   HCT 42.2 12/28/2015   MCV 91.1 12/28/2015   PLT 155 12/28/2015     Radiographic Findings: Mr Jeri Cos HY Contrast  Result Date: 10/20/2016 CLINICAL DATA:  82 y/o F; lung cancer and metastatic brain tumor. Status post surgery and radiation. EXAM: MRI HEAD WITHOUT AND WITH CONTRAST TECHNIQUE: Multiplanar, multiecho pulse sequences of the brain and surrounding structures were obtained without and with intravenous contrast. CONTRAST:  18 cc MultiHance. COMPARISON:  07/05/2016 and 01/24/2016 MRI of the head. FINDINGS: Brain: No diffusion signal abnormality. Hemosiderin line resection cavity within the right lateral temporal lobe is stable with no new marginal enhancement. Stable ventricle size. Stable brain parenchymal volume loss. Scattered foci of subcortical T2 FLAIR hyperintense signal abnormality and patchy confluent  periventricular signal abnormality probably representing a combination of posttreatment changes and microvascular ischemic disease. Superior vermian mass is decreased in size measuring 10 x 9 mm and demonstrates interval hemorrhage compatible with posttreatment changes (series 10, image 57). Additionally, associated edema is markedly decreased. 5 mm hyperintense focus within the right anteromedial temporal lobe on the coronal T1 postcontrast sequence (series 12, image 41), not appreciated on axial or sagittal postcontrast sequences, and without T2 signal abnormality, probably pulsation artifact. Attention at follow-up recommended. No additional potential focus of enhancement identified. Vascular: Normal flow voids. Skull and upper cervical spine: Stable postsurgical changes related to right temporal parietal craniotomy. Sinuses/Orbits: Trace right mastoid effusion. Visualized paranasal sinuses and mastoid air cells are otherwise unremarkable. Normal orbits. Other: None. IMPRESSION: 1. Markedly decrease in size, mass effect, and edema with interval hemorrhage associated with the superior vermian mass compatible with posttreatment changes. 2. Stable right lateral temporal lobe resection site. 3. 5 mm hyperintense focus in right anteromedial temporal lobe on coronal T1 postcontrast sequence, probably pulsation artifact. Attention at follow-up. Electronically Signed   By: Kristine Garbe M.D.   On: 10/20/2016 14:19    Impression/Plan: 1. Extensive stage small cell carcinoma of the lung with brain metastasis. Radiographically the patient has improvement in the disease previously treated in the brain. She will be followed clinically with Dr. Benay Spice and we will see him again today. We would recommend a repeat MRI scan in 3 months time. The patient is very concerned about the side effects of IV gadolinium, and requests that her skin be performed without contrast. Although I tried to reassure her that her  symptoms do not appear to be linked with this, we will proceed without contrast however she is counseled on the limitations of not having IV contrast at the time of her procedure. 2. Fatigue. This appears to be multifactorial. The patient will be followed expectantly for her symptoms, and additional diagnostic testing will be ordered if more specific symptomatology is noted. 3. Dermatologic complaints. The patient is encouraged to be evaluated by a dermatologist. Is interested in this and will be referred  for evaluation. She states agreement and we'll keep [place] informed questions or concerns she has regarding this.  In a visit lasting 45 minutes, greater than 50% of the time was spent face to face discussing her concerns about fatigue, dermatologic concerns, and coordinating the patient's care.     Carola Rhine, PAC

## 2016-10-25 ENCOUNTER — Ambulatory Visit
Admission: RE | Admit: 2016-10-25 | Discharge: 2016-10-25 | Disposition: A | Discharge status: Home / Self Care | Payer: Medicare Other | Source: Ambulatory Visit | Attending: Radiation Oncology | Admitting: Radiation Oncology

## 2016-10-25 ENCOUNTER — Encounter: Payer: Self-pay | Admitting: Radiation Oncology

## 2016-10-25 ENCOUNTER — Ambulatory Visit (HOSPITAL_BASED_OUTPATIENT_CLINIC_OR_DEPARTMENT_OTHER): Payer: Medicare Other | Admitting: Oncology

## 2016-10-25 ENCOUNTER — Ambulatory Visit (HOSPITAL_BASED_OUTPATIENT_CLINIC_OR_DEPARTMENT_OTHER): Payer: Medicare Other

## 2016-10-25 VITALS — BP 141/68 | HR 62 | Temp 98.8°F | Resp 18 | Ht 64.0 in | Wt 187.0 lb

## 2016-10-25 DIAGNOSIS — E559 Vitamin D deficiency, unspecified: Secondary | ICD-10-CM | POA: Insufficient documentation

## 2016-10-25 DIAGNOSIS — Z7982 Long term (current) use of aspirin: Secondary | ICD-10-CM | POA: Insufficient documentation

## 2016-10-25 DIAGNOSIS — Z888 Allergy status to other drugs, medicaments and biological substances status: Secondary | ICD-10-CM | POA: Insufficient documentation

## 2016-10-25 DIAGNOSIS — C7931 Secondary malignant neoplasm of brain: Secondary | ICD-10-CM | POA: Diagnosis not present

## 2016-10-25 DIAGNOSIS — Z87891 Personal history of nicotine dependence: Secondary | ICD-10-CM | POA: Diagnosis not present

## 2016-10-25 DIAGNOSIS — Z923 Personal history of irradiation: Secondary | ICD-10-CM | POA: Insufficient documentation

## 2016-10-25 DIAGNOSIS — J449 Chronic obstructive pulmonary disease, unspecified: Secondary | ICD-10-CM | POA: Insufficient documentation

## 2016-10-25 DIAGNOSIS — E785 Hyperlipidemia, unspecified: Secondary | ICD-10-CM | POA: Insufficient documentation

## 2016-10-25 DIAGNOSIS — Z955 Presence of coronary angioplasty implant and graft: Secondary | ICD-10-CM | POA: Insufficient documentation

## 2016-10-25 DIAGNOSIS — Z9221 Personal history of antineoplastic chemotherapy: Secondary | ICD-10-CM | POA: Diagnosis not present

## 2016-10-25 DIAGNOSIS — Z79899 Other long term (current) drug therapy: Secondary | ICD-10-CM | POA: Diagnosis not present

## 2016-10-25 DIAGNOSIS — R238 Other skin changes: Secondary | ICD-10-CM | POA: Insufficient documentation

## 2016-10-25 DIAGNOSIS — Z85118 Personal history of other malignant neoplasm of bronchus and lung: Secondary | ICD-10-CM | POA: Insufficient documentation

## 2016-10-25 DIAGNOSIS — C3491 Malignant neoplasm of unspecified part of right bronchus or lung: Secondary | ICD-10-CM

## 2016-10-25 DIAGNOSIS — E079 Disorder of thyroid, unspecified: Secondary | ICD-10-CM | POA: Diagnosis not present

## 2016-10-25 DIAGNOSIS — I10 Essential (primary) hypertension: Secondary | ICD-10-CM | POA: Insufficient documentation

## 2016-10-25 DIAGNOSIS — F419 Anxiety disorder, unspecified: Secondary | ICD-10-CM | POA: Diagnosis not present

## 2016-10-25 DIAGNOSIS — R5383 Other fatigue: Secondary | ICD-10-CM | POA: Insufficient documentation

## 2016-10-25 NOTE — Addendum Note (Signed)
Encounter addended by: Malena Edman, RN on: 10/25/2016  4:19 PM<BR>    Actions taken: Charge Capture section accepted

## 2016-10-25 NOTE — Progress Notes (Signed)
  [facility] OFFICE PROGRESS NOTE   Diagnosis: Small cell lung cancer  INTERVAL HISTORY:   Ms.Udall returns as scheduled. She reports a good appetite. No dyspnea. She has pain in the legs with lengthy walks. She is able to go back her usual activities without difficulty. She relates a "rash "over the extremities to eating meat.   Objective:  Vital signs in last 24 hours:  Blood pressure (!) 141/68, pulse 62, temperature 98.8 F (37.1 C), temperature source Oral, resp. rate 18, height 5\' 4"  (1.626 m), weight 187 lb (84.8 kg), SpO2 98 %.    HEENT:  neck without mass Lymphatics:  no cervical, supraclavicular, or axillary nodes Resp:  lungs with inspiratory rhonchi at the left posterior base, no respiratory distress Cardio:  regular rate and rhythm GI:  no hepatosplenomegaly Vascular:  no leg edema  Skin: Few erythematous excoriated lesions over the extremities    Medications: I have reviewed the patient's current medications.  Assessment/Plan: 1. Extensive stage small cell lung cancer  resection of right temporal brain mass 06/09/2015 confirmed metastatic small cell carcinoma  Staging PET scan at [facility] 07/22/2015 revealed decreased activity associated with previously seen pulmonary nodules , decreased activity of hypermetabolic mediastinal and bilateral hilar nodes , no new sites of FDG avid metastatic disease compared to a [facility] PET scan from November 2016  Cycle 1 etoposide/carboplatin 08/16/2015  Cycle 2 etoposide/carboplatin 09/14/2015  Cycle 3 etoposide/carboplatin 10/19/2015  Restaging chest CT 11/12/2015 with generally stable appearance of solid, sub-solid and ground glass pulmonary nodules bilaterally. Stable mildly prominent mediastinal lymph nodes. No progressive adenopathy or new findings.  Cycle 4 etoposide/carboplatin 11/16/2015   Brain MRI 01/24/2016-new 4 mm superior vermis metastasis  CT chest 06/07/2016-stable solid/sub-solid  pulmonary nodules, stable mediastinal lymph nodes, no evidence of progressive disease  MRI brain 07/05/2016-progression of the cerebellar metastasis with associated edema and effacement of the upper fourth ventricle   SRS to cerebellarlesion 07/21/2016  MRI brain 10/20/2016-decrease in size and mass effect associated with the cerebellar lesion consistent with posttreatment change   2. Chronic history of mediastinal lymphadenopathy and pulmonary nodules-diagnostic workup in 2013 including bronchoscopy/EBUS , and mediastinoscopy -negative for malignancy, , granulomatous changes noted  3. Hypertension  4. History of CAD  5. "Sweats "-most likely related to post menopausal hot flashes  6. Port-A-Cath placement 10/26/2015    Disposition:   She appears stable. No clinical evidence for progression of the small cell carcinoma. She would like to wait on a restaging chest CT until after the next office visit in August. She will continue brain imaging as directed by Dr. Lisbeth Renshaw. She is concerned about the atherosclerotic changes noted on the chest CT 06/07/2016. She has a known history of coronary artery disease and has been followed by cardiology. She will discuss the indication for additional evaluation with Dr. Dema Severin.  She will return for a Port-A-Cath flush in 6 weeks. She will be scheduled for an office visit and Port-A-Cath flush in 12 weeks.  15 minutes were spent with the patient today. The majority of the time was used for counseling and coordination of care.   Donneta Romberg, MD  10/25/2016  1:02 PM

## 2016-11-23 ENCOUNTER — Telehealth: Payer: Self-pay | Admitting: Oncology

## 2016-11-23 ENCOUNTER — Telehealth: Payer: Self-pay | Admitting: *Deleted

## 2016-11-23 NOTE — Telephone Encounter (Signed)
sw pt to confirm flush appt 7/10 at 1430 per sch msg

## 2016-11-23 NOTE — Telephone Encounter (Signed)
"  My port-a-cath is due to be flushed.  I need an appointment anytime after 12:00 pm on July 10th or July 11th.  I have transportation only in the afternoons.  Return number [phone].  Port was accessed last on May 25 th for MRI."  Scheduling message sent.

## 2016-12-05 ENCOUNTER — Ambulatory Visit (HOSPITAL_BASED_OUTPATIENT_CLINIC_OR_DEPARTMENT_OTHER): Payer: Medicare Other

## 2016-12-05 DIAGNOSIS — Z95828 Presence of other vascular implants and grafts: Secondary | ICD-10-CM

## 2016-12-05 DIAGNOSIS — C7931 Secondary malignant neoplasm of brain: Secondary | ICD-10-CM | POA: Diagnosis not present

## 2016-12-05 DIAGNOSIS — Z452 Encounter for adjustment and management of vascular access device: Secondary | ICD-10-CM | POA: Diagnosis not present

## 2016-12-05 DIAGNOSIS — C3491 Malignant neoplasm of unspecified part of right bronchus or lung: Secondary | ICD-10-CM

## 2016-12-05 MED ORDER — SODIUM CHLORIDE 0.9 % IJ SOLN
10.0000 mL | INTRAMUSCULAR | Status: DC | PRN
  Administered 2016-12-05: 10 mL via INTRAVENOUS
  Filled 2016-12-05: qty 10

## 2016-12-05 MED ORDER — HEPARIN SOD (PORK) LOCK FLUSH 100 UNIT/ML IV SOLN
500.0000 [IU] | Freq: Once | INTRAVENOUS | Status: AC | PRN
  Administered 2016-12-05: 500 [IU] via INTRAVENOUS
  Filled 2016-12-05: qty 5

## 2016-12-25 ENCOUNTER — Other Ambulatory Visit: Payer: Self-pay | Admitting: Radiation Therapy

## 2016-12-25 DIAGNOSIS — C7931 Secondary malignant neoplasm of brain: Secondary | ICD-10-CM

## 2017-01-09 ENCOUNTER — Other Ambulatory Visit: Payer: Medicare Other

## 2017-01-09 ENCOUNTER — Ambulatory Visit: Payer: Medicare Other | Admitting: Oncology

## 2017-01-11 NOTE — Progress Notes (Addendum)
Margaret Hale 82 y.o. woman with Extensive Stage small cell lung cancer with brain metastasis completed radiation 07-21-16 one month FU.  Headache: Not recently ,it been about a month Pain:No pain today Dizziness: None Nausea/Vomiting/Ataxia: None Visional changes(Blurred/Diplopia (double vision), blind spots and peripheral vision changes):None Ring in ears:yes in the right ear ,for fifteen year Fatigue: Yes  Cognitive changes: None Weight: None Appetite:  Good  Respiratory complaints:. None Decadron 4 mg completed Imaging:01-22-17 MRI brain KKD:PTEL Vitals:   01/25/17 1317  BP: 132/62  Pulse: (!) 59  Resp: 20  Temp: 98.2 F (36.8 C)  TempSrc: Oral  SpO2: 97%  Weight: 190 lb 8 oz (86.4 kg)   Wt Readings from Last 3 Encounters:  01/25/17 190 lb 8 oz (86.4 kg)  10/25/16 187 lb (84.8 kg)  10/25/16 187 lb (84.8 kg)

## 2017-01-18 ENCOUNTER — Telehealth: Payer: Self-pay

## 2017-01-18 NOTE — Telephone Encounter (Signed)
Notes sent to scheduling

## 2017-01-19 ENCOUNTER — Telehealth: Payer: Self-pay | Admitting: Cardiology

## 2017-01-19 NOTE — Telephone Encounter (Signed)
Will forward to Dr. Marlou Porch to advise on request to switch Providers.

## 2017-01-19 NOTE — Telephone Encounter (Signed)
New message      Pt want to switch from Dr Radford Pax to Dr Marlou Porch.  Is this ok?

## 2017-01-19 NOTE — Telephone Encounter (Signed)
yes

## 2017-01-22 ENCOUNTER — Ambulatory Visit (HOSPITAL_COMMUNITY)
Admission: RE | Admit: 2017-01-22 | Discharge: 2017-01-22 | Disposition: A | Discharge status: Home / Self Care | Payer: Medicare Other | Source: Ambulatory Visit | Attending: Radiation Oncology | Admitting: Radiation Oncology

## 2017-01-22 DIAGNOSIS — C7931 Secondary malignant neoplasm of brain: Secondary | ICD-10-CM | POA: Diagnosis not present

## 2017-01-22 LAB — CREATININE, SERUM: CREATININE: 0.77 mg/dL (ref 0.44–1.00)

## 2017-01-22 MED ORDER — HEPARIN SOD (PORK) LOCK FLUSH 100 UNIT/ML IV SOLN
500.0000 [IU] | INTRAVENOUS | Status: AC | PRN
  Administered 2017-01-22: 500 [IU]

## 2017-01-22 MED ORDER — GADOBENATE DIMEGLUMINE 529 MG/ML IV SOLN
20.0000 mL | Freq: Once | INTRAVENOUS | Status: AC
  Administered 2017-01-22: 18 mL via INTRAVENOUS

## 2017-01-22 NOTE — Telephone Encounter (Signed)
OK with me. My clinic however is booked until November. If she needs a visit before November or December we should have her see an APP.  Candee Furbish, MD

## 2017-01-22 NOTE — Telephone Encounter (Signed)
Will forward to scheduler to contact pt for an appt.

## 2017-01-24 ENCOUNTER — Telehealth: Payer: Self-pay | Admitting: Radiation Oncology

## 2017-01-24 NOTE — Telephone Encounter (Signed)
Pt has been scheduled with Dr Marlou Porch.

## 2017-01-24 NOTE — Telephone Encounter (Signed)
Missed last appt. With [place]  Needs to reschedule next 1-2 months

## 2017-01-24 NOTE — Telephone Encounter (Signed)
I spoke with the patient and reviewed her imaging. 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 She is asymptomatic and will come tomorrow to review findings formally. She will need to follow up with Dr. Benay Spice as well.

## 2017-01-25 ENCOUNTER — Ambulatory Visit
Admission: RE | Admit: 2017-01-25 | Discharge: 2017-01-25 | Disposition: A | Discharge status: Home / Self Care | Payer: Medicare Other | Source: Ambulatory Visit | Attending: Radiation Oncology | Admitting: Radiation Oncology

## 2017-01-25 ENCOUNTER — Other Ambulatory Visit: Payer: Self-pay

## 2017-01-25 ENCOUNTER — Telehealth: Payer: Self-pay | Admitting: Oncology

## 2017-01-25 ENCOUNTER — Encounter: Payer: Self-pay | Admitting: Radiation Oncology

## 2017-01-25 VITALS — BP 132/62 | HR 59 | Temp 98.2°F | Resp 20 | Wt 190.5 lb

## 2017-01-25 DIAGNOSIS — F419 Anxiety disorder, unspecified: Secondary | ICD-10-CM | POA: Diagnosis not present

## 2017-01-25 DIAGNOSIS — R238 Other skin changes: Secondary | ICD-10-CM | POA: Insufficient documentation

## 2017-01-25 DIAGNOSIS — Z955 Presence of coronary angioplasty implant and graft: Secondary | ICD-10-CM | POA: Insufficient documentation

## 2017-01-25 DIAGNOSIS — Z79899 Other long term (current) drug therapy: Secondary | ICD-10-CM | POA: Diagnosis not present

## 2017-01-25 DIAGNOSIS — R5383 Other fatigue: Secondary | ICD-10-CM | POA: Diagnosis not present

## 2017-01-25 DIAGNOSIS — J449 Chronic obstructive pulmonary disease, unspecified: Secondary | ICD-10-CM | POA: Insufficient documentation

## 2017-01-25 DIAGNOSIS — Z923 Personal history of irradiation: Secondary | ICD-10-CM | POA: Insufficient documentation

## 2017-01-25 DIAGNOSIS — Z87891 Personal history of nicotine dependence: Secondary | ICD-10-CM | POA: Diagnosis not present

## 2017-01-25 DIAGNOSIS — E079 Disorder of thyroid, unspecified: Secondary | ICD-10-CM | POA: Diagnosis not present

## 2017-01-25 DIAGNOSIS — E559 Vitamin D deficiency, unspecified: Secondary | ICD-10-CM | POA: Insufficient documentation

## 2017-01-25 DIAGNOSIS — Z888 Allergy status to other drugs, medicaments and biological substances status: Secondary | ICD-10-CM | POA: Insufficient documentation

## 2017-01-25 DIAGNOSIS — C3491 Malignant neoplasm of unspecified part of right bronchus or lung: Secondary | ICD-10-CM

## 2017-01-25 DIAGNOSIS — C7931 Secondary malignant neoplasm of brain: Secondary | ICD-10-CM | POA: Diagnosis present

## 2017-01-25 DIAGNOSIS — I10 Essential (primary) hypertension: Secondary | ICD-10-CM | POA: Insufficient documentation

## 2017-01-25 DIAGNOSIS — Z85118 Personal history of other malignant neoplasm of bronchus and lung: Secondary | ICD-10-CM | POA: Insufficient documentation

## 2017-01-25 DIAGNOSIS — Z7982 Long term (current) use of aspirin: Secondary | ICD-10-CM | POA: Diagnosis not present

## 2017-01-25 DIAGNOSIS — Z9221 Personal history of antineoplastic chemotherapy: Secondary | ICD-10-CM | POA: Diagnosis not present

## 2017-01-25 DIAGNOSIS — C3490 Malignant neoplasm of unspecified part of unspecified bronchus or lung: Secondary | ICD-10-CM

## 2017-01-25 DIAGNOSIS — E785 Hyperlipidemia, unspecified: Secondary | ICD-10-CM | POA: Diagnosis not present

## 2017-01-25 MED ORDER — VITAMIN E 180 MG (400 UNIT) PO CAPS: 400.0000 [IU] | ORAL_CAPSULE | Freq: Two times a day (BID) | ORAL | 6 refills | Status: DC

## 2017-01-25 MED ORDER — PENTOXIFYLLINE ER 400 MG PO TBCR: 400.0000 mg | EXTENDED_RELEASE_TABLET | Freq: Two times a day (BID) | ORAL | 6 refills | Status: DC

## 2017-01-25 NOTE — Progress Notes (Signed)
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  Interval Since Last Radiation: 6 months  07/21/16:  PTV1 sup ver 25 mm/ 18 Gy in 1 fraction  Narrative:  The patient returns today for routine follow-up after undergoing SRS to the brain. Her history has been somewhat complicated as she was encouraged to proceed with whole brain radiation when her disease was first noted, however she declined due to overwhelming concerns with cognitive decline if she were to go this route. She had repeat imaging later than the recommended interval due to her interest and avoiding as much imaging at that time. She subsequently been treated for one lesion of the brain with SRS, but does understand the rationale to consider whole brain radiotherapy if her disease progresses.  She has agreed to surveillance MRIs. 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                         On review of systems, the patient reports that she is doing well overall. She denies any chest pain, shortness of breath, cough, fevers, chills, night sweats, unintended weight changes. She denies any bowel or bladder disturbances, and denies abdominal pain, nausea or vomiting. She denies any headaches at this time. She is not dizzy or having issues with auditory or visual changes. She reports fatigue and weight gain  and would like to go back to regular exercise. She denies any new musculoskeletal or joint aches or pains, new skin lesions or concerns. A complete review of systems is obtained and is otherwise negative.   Past Medical History:  Past Medical History:  Diagnosis Date  . Anxiety   . Baker's cyst    L KNEE  . CAD (coronary artery disease) 2007   PCI of RCA, bare metal stent.....Dr. Radford Pax  . Cancer (HCC)    lung mass  . Disc disorder of lumbar region    2 BULDGING DISCS DX 1990 BY DR. DALLDORF  . Dizzy   . Hyperlipidemia   . Hypertension    Takes Novasc ,Clonidine, & Losartan  . No pertinent past medical history    lung mass  occas cough   . Thyroid disease    nodules,stable on u/s 2/12  . Vitamin D deficiency     Past Surgical History: Past Surgical History:  Procedure Laterality Date  . CARPAL TUNNEL RELEASE  06/26/08        04/20/09   RIGHT HAND          LEFT HAND  . CORONARY ANGIOPLASTY     dr h Tamala Julian [phone] with one stent  . CRANIOTOMY Right 06/09/2015   Procedure: CRANIOTOMY TUMOR EXCISION WITH APPLICATION OF CRANIAL NAVIGATION;  Surgeon: Newman Pies, MD;  Location: [facility];  Service: Neurosurgery;  Laterality: Right;  Right temporal craniotomy for resection of tumor  .  EYE SURGERY  01/07/09   LEFT CATARACT  . EYE SURGERY  01/21/09   RIGHT CATARACT  . FINGER CONTRACTURE RELEASE  8/12    Dr. Amedeo Plenty   right hand trigger finger release  . MEDIASTINOSCOPY  08/02/2011   Procedure: MEDIASTINOSCOPY;  Surgeon: Nicanor Alcon, MD;  Location: [facility];  Service: Thoracic;  Laterality: N/A;  . right power port placement Right     Social History:  Social History   Social History  . Marital status: Widowed    Spouse name: N/A  . Number of children: 2  . Years of education: N/A   Occupational History  . Retired    Social History Main Topics  . Smoking status: Former Smoker    Packs/day: 0.50    Years: 10.00    Types: Cigarettes    Quit date: 07/12/2005  .  Smokeless tobacco: Never Used  . Alcohol use No  . Drug use: No  . Sexual activity: Not on file   Other Topics Concern  . Not on file   Social History Narrative  . No narrative on file    Family History: Family History  Problem Relation Age of Onset  . Anesthesia problems Neg Hx   . Hypotension Neg Hx   . Malignant hyperthermia Neg Hx   . Pseudochol deficiency Neg Hx      ALLERGIES:  is allergic to food; lipitor [atorvastatin calcium]; tape; and pantoprazole sodium.  Meds: Current Outpatient Prescriptions  Medication Sig Dispense Refill  . amLODipine (NORVASC) 10 MG tablet Take 10 mg by mouth every evening.     Marland Kitchen aspirin 81 MG tablet Take 81 mg by mouth daily.    . hydrochlorothiazide (HYDRODIURIL) 25 MG tablet Take 25 mg by mouth daily.    Marland Kitchen ibuprofen (ADVIL,MOTRIN) 200 MG tablet Take 200 mg by mouth every 6 (six) hours as needed.    . labetalol (NORMODYNE) 100 MG tablet Take 100 mg by mouth daily.    Marland Kitchen lidocaine-prilocaine (EMLA) cream Apply 1 application topically as needed. Apply to port site 1 hr prior to port access and cover with plastic wrap 30 g 0  . metoprolol succinate (TOPROL-XL) 100 MG 24 hr tablet Take 100 mg by mouth daily. Take with or immediately following a meal.    . Omega-3 Fatty Acids (FISH OIL PO) Take 4,000 Units by mouth 2 (two) times daily.    Marland Kitchen rOPINIRole (REQUIP) 1 MG tablet Take 1 mg by mouth at bedtime.    . triamcinolone cream (KENALOG) 0.1 %     . valsartan (DIOVAN) 320 MG tablet Take 320 mg by mouth daily.    . pentoxifylline (TRENTAL) 400 MG CR tablet Take 1 tablet (400 mg total) by mouth 2 (two) times daily. 60 tablet 6  . vitamin E (VITAMIN E) 400 UNIT capsule Take 1 capsule (400 Units total) by mouth 2 (two) times daily. 60 capsule 6   No current facility-administered medications for this encounter.     Physical Findings:  weight is 190 lb 8 oz (86.4 kg). Her oral temperature is 98.2 F (36.8 C). Her blood pressure is 132/62 and her  pulse is 59 (abnormal). Her respiration is 20 and oxygen saturation is 97%.  Pain Assessment Pain Score: 0-No pain/10 In general this is a well appearing [place] European Female in no acute distress. She's alert and oriented x4 and appropriate throughout the examination. She appears to be grossly neurologically intact, without focal change. Cardiopulmonary assessment is negative for acute  distress and she exhibits normal effort.    Lab Findings: Lab Results  Component Value Date   WBC 5.2 12/28/2015   HGB 13.9 12/28/2015   HCT 42.2 12/28/2015   MCV 91.1 12/28/2015   PLT 155 12/28/2015     Radiographic Findings: Mr Jeri Cos ER Contrast  Result Date: 01/22/2017 CLINICAL DATA:  SRS protocol. No new symptoms. Metastatic small cell lung cancer. EXAM: MRI HEAD WITHOUT AND WITH CONTRAST TECHNIQUE: Multiplanar, multiecho pulse sequences of the brain and surrounding structures were obtained without and with intravenous contrast. CONTRAST:  39mL MULTIHANCE GADOBENATE DIMEGLUMINE 529 MG/ML IV SOLN COMPARISON:  10/20/2016. FINDINGS: Brain: Interval significant worsening of edema and enhancement of the treated superior vermian metastasis. Moderate vasogenic edema can be seen in the RIGHT and LEFT cerebellum. The lesion now measures 13 x 13 x 16 mm, compared with 10 x 9 x 9 mm on most recent previous. Increasing prominence of regional blood products. Post treatment effect is favored. Continued surveillance warranted. Previously questioned focus of enhancement in the anteromedial temporal lobe is no longer seen. Stable ventricular size and parenchymal volume loss. Extensive white matter disease without progression. Postsurgical change RIGHT temporal lobe without marginal enhancement of the cavity. Vascular: Flow voids are maintained throughout the carotid, basilar, and vertebral arteries. LEFT vertebral dominant. Skull and upper cervical spine: Postsurgical change to the RIGHT calvarium is stable. Marrow signal  is unremarkable. No upper cervical cord lesions. Sinuses/Orbits: No significant sinus disease. BILATERAL cataract extraction. Other: None. IMPRESSION: Interval increase in size and enhancement of the SRS treated superior vermian metastasis accompanied by blood products and edema. Post treatment changes are favored. Continued surveillance is warranted, follow-up in 60-90 days. The area of previously questioned enhancement in the RIGHT temporal lobe appears to represent pulsation artifact. No clear delineation of abnormality in that area on today's study. Stable chronic changes as described. Electronically Signed   By: Staci Righter M.D.   On: 01/22/2017 16:01    Impression/Plan: 1. Extensive stage small cell carcinoma of the lung with brain metastasis. The patient appears to be doing well radiographically. 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 We discussed these vitamin E and Trental which she is in agreement with. This is been prescribed and sent her pharmacy. We recommend repeat MRI with contrast in 3 month's time, she states agreement and understanding of this plan. 2. Financial constraints. The patient is concerned about her income and expenditures for some of her medical care as well as being able to cover the cost of groceries. I will recheck out to social work, and we appreciate their assistance with helping some of her needs and helping advocate for her.     Carola Rhine, PAC

## 2017-01-25 NOTE — Telephone Encounter (Signed)
sw pt son to confirm r/s 8/14 appt to 9/20 at 1015 per sch msg

## 2017-02-14 ENCOUNTER — Ambulatory Visit (HOSPITAL_COMMUNITY)
Admission: RE | Admit: 2017-02-14 | Discharge: 2017-02-14 | Disposition: A | Discharge status: Home / Self Care | Payer: Medicare Other | Source: Ambulatory Visit | Attending: Oncology | Admitting: Oncology

## 2017-02-14 DIAGNOSIS — I700 Atherosclerosis of aorta: Secondary | ICD-10-CM | POA: Insufficient documentation

## 2017-02-14 DIAGNOSIS — R918 Other nonspecific abnormal finding of lung field: Secondary | ICD-10-CM | POA: Insufficient documentation

## 2017-02-14 DIAGNOSIS — C3490 Malignant neoplasm of unspecified part of unspecified bronchus or lung: Secondary | ICD-10-CM | POA: Diagnosis not present

## 2017-02-15 ENCOUNTER — Ambulatory Visit (HOSPITAL_BASED_OUTPATIENT_CLINIC_OR_DEPARTMENT_OTHER): Payer: Medicare Other

## 2017-02-15 ENCOUNTER — Telehealth: Payer: Self-pay | Admitting: *Deleted

## 2017-02-15 ENCOUNTER — Other Ambulatory Visit: Payer: Medicare Other

## 2017-02-15 ENCOUNTER — Ambulatory Visit (HOSPITAL_BASED_OUTPATIENT_CLINIC_OR_DEPARTMENT_OTHER): Payer: Medicare Other | Admitting: Oncology

## 2017-02-15 ENCOUNTER — Telehealth: Payer: Self-pay | Admitting: Oncology

## 2017-02-15 VITALS — BP 143/66 | HR 50 | Temp 97.6°F | Resp 18 | Ht 64.0 in | Wt 188.1 lb

## 2017-02-15 DIAGNOSIS — C3491 Malignant neoplasm of unspecified part of right bronchus or lung: Secondary | ICD-10-CM | POA: Diagnosis not present

## 2017-02-15 DIAGNOSIS — I10 Essential (primary) hypertension: Secondary | ICD-10-CM

## 2017-02-15 DIAGNOSIS — C7931 Secondary malignant neoplasm of brain: Secondary | ICD-10-CM | POA: Diagnosis not present

## 2017-02-15 DIAGNOSIS — Z95828 Presence of other vascular implants and grafts: Secondary | ICD-10-CM

## 2017-02-15 MED ORDER — HEPARIN SOD (PORK) LOCK FLUSH 100 UNIT/ML IV SOLN
500.0000 [IU] | Freq: Once | INTRAVENOUS | Status: AC | PRN
  Administered 2017-02-15: 500 [IU] via INTRAVENOUS
  Filled 2017-02-15: qty 5

## 2017-02-15 MED ORDER — SODIUM CHLORIDE 0.9 % IJ SOLN
10.0000 mL | INTRAMUSCULAR | Status: DC | PRN
  Administered 2017-02-15: 10 mL via INTRAVENOUS
  Filled 2017-02-15: qty 10

## 2017-02-15 NOTE — Progress Notes (Signed)
[facility] OFFICE PROGRESS NOTE   Diagnosis: Small cell carcinoma  INTERVAL HISTORY:   Margaret Hale returns as scheduled. She feels well. She reports discomfort in the legs with ambulation. Good appetite. She continued to have sweats. No headache or balance issues. An MRI of the brain 01/22/2017 revealed worsening of edema and enhancement at the treated superior vermis metastasis. An area of previously questioned enhancement in the right temporal lobe is no longer apparent.  She was placed on pentoxifylline radiation oncology.  Objective:  Vital signs in last 24 hours:  Blood pressure (!) 143/66, pulse (!) 50, temperature 97.6 F (36.4 C), temperature source Oral, resp. rate 18, height 5\' 4"  (1.626 m), weight 188 lb 1.6 oz (85.3 kg), SpO2 100 %.    HEENT:  neck without mass Lymphatics:  no cervical, supraclavicular, axillary, or inguinal nodes Resp:  distant breath sounds, no respiratory distress Cardio:  regular rate and rhythm GI:  no hepatosplenomegaly, nontender Vascular:  no leg edema Skin: Erythematous focal lesions at the lower leg bilaterally   Portacath/PICC-without erythema  Lab Results:  Lab Results  Component Value Date   WBC 5.2 12/28/2015   HGB 13.9 12/28/2015   HCT 42.2 12/28/2015   MCV 91.1 12/28/2015   PLT 155 12/28/2015   NEUTROABS 3.1 12/28/2015    CMP     Component Value Date/Time   NA 141 06/08/2016 0907   K 4.1 06/08/2016 0907   CL 104 06/07/2015 0948   CO2 29 06/08/2016 0907   GLUCOSE 120 06/08/2016 0907   BUN 24.7 06/08/2016 0907   CREATININE 0.77 01/22/2017 1320   CREATININE 0.8 06/08/2016 0907   CALCIUM 9.3 06/08/2016 0907   PROT 7.1 12/28/2015 0945   ALBUMIN 3.9 12/28/2015 0945   AST 20 12/28/2015 0945   ALT 23 12/28/2015 0945   ALKPHOS 110 12/28/2015 0945   BILITOT 0.44 12/28/2015 0945   GFRNONAA >60 01/22/2017 1320   GFRAA >60 01/22/2017 1320     Imaging:  Ct Chest Wo Contrast  Result Date:  02/14/2017 CLINICAL DATA:  Non-small-cell lung cancer diagnosed November 2016 with metastatic disease to the brain. Radiation and chemotherapy completed. EXAM: CT CHEST WITHOUT CONTRAST TECHNIQUE: Multidetector CT imaging of the chest was performed following the standard protocol without IV contrast. COMPARISON:  Chest CT 06/07/2016 and 11/12/2015. FINDINGS: Cardiovascular: Diffuse atherosclerosis of the aorta, great vessels and coronary arteries. No acute vascular findings are seen on noncontrast imaging. There is a right IJ Port-A-Cath with its tip at the superior cavoatrial junction. Mitral annular calcifications are present. The heart size is normal. There is no pericardial effusion. Mediastinum/Nodes: Several prominent mediastinal lymph nodes are stable, including a 12 mm precarinal node on image 53 and an 11 mm subcarinal node on image 62. The latter is partially calcified. No progressive adenopathy identified. Low-density right thyroid lesions are grossly stable. The esophagus and trachea appear unremarkable. Lungs/Pleura: There is no pleural effusion. Stable mild emphysema and biapical scarring. The previously demonstrated sub solid upper lobe pulmonary nodules have improved bilaterally. Scattered small solid nodules bilaterally are stable, largest in the right middle lobe, measuring 10 x 7 mm on image 76. No new or enlarging nodules are identified. Upper abdomen: The visualized upper abdomen appears stable without suspicious findings. There is no adrenal mass. There is a low-density lesion in the lower pole of the left kidney, similar to previous studies and likely a cyst. Musculoskeletal/Chest wall: There is no chest wall mass or suspicious osseous finding. IMPRESSION: 1. Sub  solid lesions in both upper lobes have improved compared with the most recent study, likely resolving inflammation or treated tumor. 2. Scattered solid pulmonary nodules bilaterally are stable, largest in the right middle lobe. No new  or enlarging nodules demonstrated. 3. Stable mildly prominent mediastinal lymph nodes. 4. Grossly stable right thyroid lesion. 5.  Aortic Atherosclerosis (ICD10-I70.0). Electronically Signed   By: Richardean Sale M.D.   On: 02/14/2017 10:47    Medications: I have reviewed the patient's current medications.  Assessment/Plan: 1. Extensive stage small cell lung cancer  resection of right temporal brain mass 06/09/2015 confirmed metastatic small cell carcinoma  Staging PET scan at [facility] 07/22/2015 revealed decreased activity associated with previously seen pulmonary nodules , decreased activity of hypermetabolic mediastinal and bilateral hilar nodes , no new sites of FDG avid metastatic disease compared to a [facility] PET scan from November 2016  Cycle 1 etoposide/carboplatin 08/16/2015  Cycle 2 etoposide/carboplatin 09/14/2015  Cycle 3 etoposide/carboplatin 10/19/2015  Restaging chest CT 11/12/2015 with generally stable appearance of solid, sub-solid and ground glass pulmonary nodules bilaterally. Stable mildly prominent mediastinal lymph nodes. No progressive adenopathy or new findings.  Cycle 4 etoposide/carboplatin 11/16/2015   Brain MRI 01/24/2016-new 4 mm superior vermis metastasis  CT chest 06/07/2016-stable solid/sub-solid pulmonary nodules, stable mediastinal lymph nodes, no evidence of progressive disease  MRI brain 07/05/2016-progression of the cerebellar metastasis with associated edema and effacement of the upper fourth ventricle   SRS to cerebellarlesion 07/21/2016  MRI brain 10/20/2016-decrease in size and mass effect associated with the cerebellar lesion consistent with posttreatment change   MRI brain 01/22/2017-increased size, enhancement, and edema at the vermis metastasis, started on pentoxifyilline  Chest CT 02/14/2017-stable scattered solid for a nodules, stable small B-cell lymph nodes, improvement in sub-solid lesions in the bilateral upper lobes   2.  Chronic history of mediastinal lymphadenopathy and pulmonary nodules-diagnostic workup in 2013 including bronchoscopy/EBUS , and mediastinoscopy -negative for malignancy, , granulomatous changes noted  3. Hypertension  4. History of CAD  5. "Sweats "-most likely related to post menopausal hot flashes  6. Port-A-Cath placement 10/26/2015   Disposition:   There is no evidence for systemic progression of the small cell carcinoma. She will continue follow-up with Dr. Lisbeth Renshaw for management of the CNS metastases.  She would like to keep the Port-A-Cath in place. She will return for a Port-A-Cath flush in 6 weeks and an office visit in 3 months. I recommended she obtain an influenza vaccine.  15 minutes were spent with the patient today. The majority of the time was used for counseling and coordination of care.  Donneta Romberg, MD  02/15/2017  11:30 AM

## 2017-02-15 NOTE — Telephone Encounter (Signed)
Called patient home number,no answer, called her cell and son answered, asked that he would convey message to his mother she was sleeping, The trental should taken for about 6 months, and will be reassessed after the next MRI, is to help  with the inflammation of radiation necrosis, son stated he would convey this mess age and thanked the RN for the call 3:04 PM

## 2017-02-15 NOTE — Telephone Encounter (Signed)
Gave avs and calendar for November and December

## 2017-02-19 ENCOUNTER — Other Ambulatory Visit: Payer: Self-pay | Admitting: Family Medicine

## 2017-02-19 DIAGNOSIS — Z1231 Encounter for screening mammogram for malignant neoplasm of breast: Secondary | ICD-10-CM

## 2017-03-08 ENCOUNTER — Ambulatory Visit
Admission: RE | Admit: 2017-03-08 | Discharge: 2017-03-08 | Disposition: A | Discharge status: Home / Self Care | Payer: Medicare Other | Source: Ambulatory Visit | Attending: Family Medicine | Admitting: Family Medicine

## 2017-03-08 DIAGNOSIS — Z1231 Encounter for screening mammogram for malignant neoplasm of breast: Secondary | ICD-10-CM

## 2017-03-29 ENCOUNTER — Ambulatory Visit (HOSPITAL_BASED_OUTPATIENT_CLINIC_OR_DEPARTMENT_OTHER): Payer: Medicare Other

## 2017-03-29 DIAGNOSIS — Z95828 Presence of other vascular implants and grafts: Secondary | ICD-10-CM

## 2017-03-29 DIAGNOSIS — Z452 Encounter for adjustment and management of vascular access device: Secondary | ICD-10-CM | POA: Diagnosis not present

## 2017-03-29 DIAGNOSIS — C3491 Malignant neoplasm of unspecified part of right bronchus or lung: Secondary | ICD-10-CM

## 2017-03-29 DIAGNOSIS — C7931 Secondary malignant neoplasm of brain: Secondary | ICD-10-CM

## 2017-03-29 MED ORDER — SODIUM CHLORIDE 0.9 % IJ SOLN
10.0000 mL | INTRAMUSCULAR | Status: DC | PRN
  Administered 2017-03-29: 10 mL via INTRAVENOUS
  Filled 2017-03-29: qty 10

## 2017-03-29 MED ORDER — HEPARIN SOD (PORK) LOCK FLUSH 100 UNIT/ML IV SOLN
500.0000 [IU] | Freq: Once | INTRAVENOUS | Status: AC | PRN
  Administered 2017-03-29: 500 [IU] via INTRAVENOUS
  Filled 2017-03-29: qty 5

## 2017-04-11 ENCOUNTER — Other Ambulatory Visit: Payer: Self-pay | Admitting: Radiation Therapy

## 2017-04-11 DIAGNOSIS — C7931 Secondary malignant neoplasm of brain: Secondary | ICD-10-CM

## 2017-04-11 DIAGNOSIS — C7949 Secondary malignant neoplasm of other parts of nervous system: Principal | ICD-10-CM

## 2017-04-26 ENCOUNTER — Ambulatory Visit (HOSPITAL_COMMUNITY)
Admission: RE | Admit: 2017-04-26 | Discharge: 2017-04-26 | Disposition: A | Discharge status: Home / Self Care | Payer: Medicare Other | Source: Ambulatory Visit | Attending: Radiation Oncology | Admitting: Radiation Oncology

## 2017-04-26 DIAGNOSIS — C7949 Secondary malignant neoplasm of other parts of nervous system: Secondary | ICD-10-CM | POA: Insufficient documentation

## 2017-04-26 DIAGNOSIS — C7931 Secondary malignant neoplasm of brain: Secondary | ICD-10-CM | POA: Diagnosis present

## 2017-04-26 LAB — POCT I-STAT CREATININE: Creatinine, Ser: 0.7 mg/dL (ref 0.44–1.00)

## 2017-04-26 MED ORDER — GADOBENATE DIMEGLUMINE 529 MG/ML IV SOLN
20.0000 mL | Freq: Once | INTRAVENOUS | Status: AC
  Administered 2017-04-26: 18 mL via INTRAVENOUS

## 2017-04-26 MED ORDER — HEPARIN SOD (PORK) LOCK FLUSH 100 UNIT/ML IV SOLN
500.0000 [IU] | INTRAVENOUS | Status: AC | PRN
  Administered 2017-04-26: 500 [IU]

## 2017-04-30 ENCOUNTER — Ambulatory Visit: Payer: Self-pay | Admitting: Radiation Oncology

## 2017-05-08 ENCOUNTER — Ambulatory Visit
Admission: RE | Admit: 2017-05-08 | Discharge: 2017-05-08 | Disposition: A | Discharge status: Home / Self Care | Payer: Medicare Other | Source: Ambulatory Visit | Attending: Radiation Oncology | Admitting: Radiation Oncology

## 2017-05-08 ENCOUNTER — Telehealth: Payer: Self-pay | Admitting: Radiation Oncology

## 2017-05-08 DIAGNOSIS — E079 Disorder of thyroid, unspecified: Secondary | ICD-10-CM | POA: Insufficient documentation

## 2017-05-08 DIAGNOSIS — Z85118 Personal history of other malignant neoplasm of bronchus and lung: Secondary | ICD-10-CM | POA: Insufficient documentation

## 2017-05-08 DIAGNOSIS — C7931 Secondary malignant neoplasm of brain: Secondary | ICD-10-CM | POA: Insufficient documentation

## 2017-05-08 DIAGNOSIS — Z923 Personal history of irradiation: Secondary | ICD-10-CM | POA: Insufficient documentation

## 2017-05-08 DIAGNOSIS — R238 Other skin changes: Secondary | ICD-10-CM | POA: Insufficient documentation

## 2017-05-08 DIAGNOSIS — J449 Chronic obstructive pulmonary disease, unspecified: Secondary | ICD-10-CM | POA: Insufficient documentation

## 2017-05-08 DIAGNOSIS — E559 Vitamin D deficiency, unspecified: Secondary | ICD-10-CM | POA: Insufficient documentation

## 2017-05-08 DIAGNOSIS — Z87891 Personal history of nicotine dependence: Secondary | ICD-10-CM | POA: Insufficient documentation

## 2017-05-08 DIAGNOSIS — Z9221 Personal history of antineoplastic chemotherapy: Secondary | ICD-10-CM | POA: Insufficient documentation

## 2017-05-08 DIAGNOSIS — Z7982 Long term (current) use of aspirin: Secondary | ICD-10-CM | POA: Insufficient documentation

## 2017-05-08 DIAGNOSIS — Z79899 Other long term (current) drug therapy: Secondary | ICD-10-CM | POA: Insufficient documentation

## 2017-05-08 DIAGNOSIS — Z888 Allergy status to other drugs, medicaments and biological substances status: Secondary | ICD-10-CM | POA: Insufficient documentation

## 2017-05-08 DIAGNOSIS — I10 Essential (primary) hypertension: Secondary | ICD-10-CM | POA: Insufficient documentation

## 2017-05-08 DIAGNOSIS — F419 Anxiety disorder, unspecified: Secondary | ICD-10-CM | POA: Insufficient documentation

## 2017-05-08 DIAGNOSIS — Z955 Presence of coronary angioplasty implant and graft: Secondary | ICD-10-CM | POA: Insufficient documentation

## 2017-05-08 DIAGNOSIS — E785 Hyperlipidemia, unspecified: Secondary | ICD-10-CM | POA: Insufficient documentation

## 2017-05-08 DIAGNOSIS — R5383 Other fatigue: Secondary | ICD-10-CM | POA: Insufficient documentation

## 2017-05-08 NOTE — Telephone Encounter (Signed)
I called the patient to review her MRI results. She would like her next MRI and follow up to be after 1pm in the day. We will see her back in 3 months.

## 2017-05-09 ENCOUNTER — Ambulatory Visit: Payer: Medicare Other | Admitting: Cardiology

## 2017-05-09 ENCOUNTER — Encounter: Payer: Self-pay | Admitting: Cardiology

## 2017-05-09 VITALS — BP 170/100 | HR 64 | Ht 64.0 in | Wt 185.0 lb

## 2017-05-09 DIAGNOSIS — I2510 Atherosclerotic heart disease of native coronary artery without angina pectoris: Secondary | ICD-10-CM | POA: Diagnosis not present

## 2017-05-09 DIAGNOSIS — I700 Atherosclerosis of aorta: Secondary | ICD-10-CM | POA: Diagnosis not present

## 2017-05-09 DIAGNOSIS — I10 Essential (primary) hypertension: Secondary | ICD-10-CM

## 2017-05-09 DIAGNOSIS — D496 Neoplasm of unspecified behavior of brain: Secondary | ICD-10-CM | POA: Diagnosis not present

## 2017-05-09 DIAGNOSIS — Z789 Other specified health status: Secondary | ICD-10-CM | POA: Diagnosis not present

## 2017-05-09 MED ORDER — ROSUVASTATIN CALCIUM 10 MG PO TABS: 10.0000 mg | ORAL_TABLET | ORAL | 3 refills | Status: DC

## 2017-05-09 NOTE — Progress Notes (Signed)
Cardiology Office Note:    Date:  05/09/2017   ID:  Margaret Hale, DOB [birth year], MRN [id number]  PCP:  [name], MD  Cardiologist:  Candee Furbish, MD    Referring MD: [name], MD     History of Present Illness:    Margaret Hale is a 82 y.o. female with a hx of coronary disease status post PCI of RCA with bare-metal stent 2007 hypertension hyperlipidemia with prior stress test showing no ischemia.  Chronic jaw pain that was not thought to be cardiac related.  Has hypertension, hyperlipidemia. Unfortunately, she has been seeing oncology second to lung small cell carcinoma.  MRI of the brain no new metastasis.  She was placed on radiation oncology.  Aortic atherosclerosis was noted.  Overall she is not having any anginal symptoms.  She did have classic anginal symptoms back in 2007.  She had some concerns over her recent CT scan which showed extensive aortic and coronary artery atherosclerosis present.  Mitral annular calcification was also discussed as were.  She has been without her mother since age 43.  She died following the war.  She remembers her having a vegetable with every meal.  She eats very well.  No potatoes.  No meat.    Past Medical History:  Diagnosis Date  . Adenomatous polyp 02/2009 &02/2012  . Anxiety   . Baker's cyst    L KNEE  . Bulging lumbar disc 1990  . CAD (coronary artery disease) 2007   PCI of RCA, bare metal stent.....Dr. Radford Pax  . Cancer (HCC)    lung mass  . Disc disorder of lumbar region    2 BULDGING DISCS DX 1990 BY DR. DALLDORF  . Dizzy   . Hyperlipidemia   . Hypertension    Takes Novasc ,Clonidine, & Losartan  . Lipoma of back   . Menopausal flushing   . Multiple thyroid nodules   . No pertinent past medical history    lung mass  occas cough   . Osteoporosis   . Positive test for herpes simplex virus (HSV) antibody   . Sinusitis, chronic   . Spondylosis    NECK  . Thyroid disease    nodules,stable on u/s 2/12  .  Vitamin D deficiency     Past Surgical History:  Procedure Laterality Date  . CARPAL TUNNEL RELEASE  06/26/08        04/20/09   RIGHT HAND          LEFT HAND  . CORONARY ANGIOPLASTY     dr h Tamala Julian [phone] with one stent  . CRANIOTOMY Right 06/09/2015   Procedure: CRANIOTOMY TUMOR EXCISION WITH APPLICATION OF CRANIAL NAVIGATION;  Surgeon: Newman Pies, MD;  Location: [facility];  Service: Neurosurgery;  Laterality: Right;  Right temporal craniotomy for resection of tumor  . EYE SURGERY  01/07/09   LEFT CATARACT  . EYE SURGERY  01/21/09   RIGHT CATARACT  . FINGER CONTRACTURE RELEASE  8/12    Dr. Amedeo Plenty   right hand trigger finger release  . MEDIASTINOSCOPY  08/02/2011   Procedure: MEDIASTINOSCOPY;  Surgeon: Nicanor Alcon, MD;  Location: [facility];  Service: Thoracic;  Laterality: N/A;  . right power port placement Right     Current Medications: Current Meds  Medication Sig  . amLODipine (NORVASC) 10 MG tablet Take 10 mg by mouth every evening.   Marland Kitchen aspirin 81 MG tablet Take 81 mg by mouth daily.  . Cholecalciferol (VITAMIN D) 2000 units CAPS Take  by mouth.  . hydrochlorothiazide (HYDRODIURIL) 25 MG tablet Take 25 mg by mouth daily.  Marland Kitchen ibuprofen (ADVIL,MOTRIN) 200 MG tablet Take 200 mg by mouth every 6 (six) hours as needed.  . labetalol (NORMODYNE) 100 MG tablet Take 100 mg by mouth daily.  Marland Kitchen lidocaine-prilocaine (EMLA) cream Apply 1 application topically as needed. Apply to port site 1 hr prior to port access and cover with plastic wrap  . metoprolol succinate (TOPROL-XL) 100 MG 24 hr tablet Take 100 mg by mouth daily. Take with or immediately following a meal.  . MOMETASONE FUROATE EX Apply topically. 3.9% OINTMENT 1 APPLICATION TO AFFECTED AREA EXTERNALLY ONCE A DAY  . olmesartan-hydrochlorothiazide (BENICAR HCT) 40-25 MG tablet Take 1 tablet by mouth daily.  . Omega-3 Fatty Acids (FISH OIL PO) Take 4,000 Units by mouth 2 (two) times daily.  . pentoxifylline (TRENTAL) 400 MG CR  tablet Take 1 tablet (400 mg total) by mouth 2 (two) times daily.  Marland Kitchen rOPINIRole (REQUIP) 1 MG tablet Take 1 mg by mouth at bedtime.  . triamcinolone cream (KENALOG) 0.1 % Apply 1 application topically 3 (three) times daily.   . vitamin E (VITAMIN E) 400 UNIT capsule Take 1 capsule (400 Units total) by mouth 2 (two) times daily.     Allergies:   Food; Lipitor [atorvastatin calcium]; Tape; and Pantoprazole sodium   Social History   Socioeconomic History  . Marital status: Widowed    Spouse name: None  . Number of children: 2  . Years of education: None  . Highest education level: None  Social Needs  . Financial resource strain: None  . Food insecurity - worry: None  . Food insecurity - inability: None  . Transportation needs - medical: None  . Transportation needs - non-medical: None  Occupational History  . Occupation: Retired  Tobacco Use  . Smoking status: Former Smoker    Packs/day: 0.50    Years: 10.00    Pack years: 5.00    Types: Cigarettes    Last attempt to quit: 07/12/2005    Years since quitting: 11.8  . Smokeless tobacco: Never Used  Substance and Sexual Activity  . Alcohol use: No  . Drug use: No  . Sexual activity: None  Other Topics Concern  . None  Social History Narrative  . None     Family History: The patient's family history includes Healthy in her son and son. There is no history of Anesthesia problems, Hypotension, Malignant hyperthermia, or Pseudochol deficiency. ROS:   Please see the history of present illness.     All other systems reviewed and are negative.  EKGs/Labs/Other Studies Reviewed:    The following studies were reviewed today: Lab work, prior office note, stress test reviewed  Cath 10/20/2005  1.  Mild to moderate left main disease with diameter stenosis of 35%      documented by intravascular ultrasound.  2.  Successful bare metal stent of the mid right coronary with reduction in      stenosis from 99% to less than 10% with  TIMI grade 3 flow.  There are  two overlapping 3.5 Liberte stents placed.   PLAN:  1.  Plavix for at least 3 weeks.  2.  Continued Integrilin x18 hours.    EKG:  EKG is  ordered today.  The ekg ordered today demonstrates sinus rhythm PAC 64 personally viewed.  Recent Labs: 06/08/2016: BUN 24.7; Potassium 4.1; Sodium 141 04/26/2017: Creatinine, Ser 0.70  Recent Lipid Panel  Component Value Date/Time   CHOL 195 10/01/2014 1500   TRIG 63.0 10/01/2014 1500   HDL 74.70 10/01/2014 1500   CHOLHDL 3 10/01/2014 1500   VLDL 12.6 10/01/2014 1500   LDLCALC 108 (H) 10/01/2014 1500    Physical Exam:    VS:  BP (!) 170/100   Pulse 64   Ht 5\' 4"  (1.626 m)   Wt 185 lb (83.9 kg)   SpO2 97%   BMI 31.76 kg/m     Wt Readings from Last 3 Encounters:  05/09/17 185 lb (83.9 kg)  02/15/17 188 lb 1.6 oz (85.3 kg)  01/25/17 190 lb 8 oz (86.4 kg)     GEN:  Well nourished, well developed in no acute distress HEENT: Normal NECK: No JVD; No carotid bruits LYMPHATICS: No lymphadenopathy CARDIAC: RRR, no murmurs, rubs, gallops RESPIRATORY:  Clear to auscultation without rales, wheezing or rhonchi  ABDOMEN: Soft, non-tender, non-distended MUSCULOSKELETAL:  No edema; No deformity  SKIN: Warm and dry NEUROLOGIC:  Alert and oriented x 3 PSYCHIATRIC:  Normal affect   ASSESSMENT:    1. Coronary artery disease involving native coronary artery of native heart without angina pectoris   2. Benign essential HTN   3. Brain tumor ([place])   4. Aortic atherosclerosis (HCC)   5. Statin intolerance    PLAN:    In order of problems listed above:  Statin intolerance  -Like to try Crestor 10 mg once a week for plaque stabilization.  She does have extensive aortic atherosclerosis as well as coronary artery calcification noted.  CAD as well.  If she has any severe leg pain, she will let us know.  Aortic atherosclerosis  -We will try Crestor 10.  Continue with excellent diet.  She is doing a great job  with this. LDL 92  Mitral annular calcification  -This is sometimes seen especially with aging process.  Should not be of any significant clinical significance at this point.  Brain lesion/small cell lung cancer.  -MRI of brain in September 2018 improved.  Post brain surgery.  Coronary artery disease  -2 overlapping RCA stents placed in the past.  She had moderate left main disease.  No symptoms currently.  No anginal symptoms.  Most recent stress test without any ischemia in 2015.  We will see her back in 2 months.   Medication Adjustments/Labs and Tests Ordered: Current medicines are reviewed at length with the patient today.  Concerns regarding medicines are outlined above.  Orders Placed This Encounter  Procedures  . EKG 12-Lead   Meds ordered this encounter  Medications  . rosuvastatin (CRESTOR) 10 MG tablet    Sig: Take 1 tablet (10 mg total) by mouth once a week.    Dispense:  15 tablet    Refill:  3    Signed, Candee Furbish, MD  05/09/2017 3:39 PM    [place]

## 2017-05-09 NOTE — Patient Instructions (Signed)
Medication Instructions:  Please start Crestor 10 mg once a week. Continue all other medications as listed.  Follow-Up: Follow up in 2 months with Dr Marlou Porch.  If you need a refill on your cardiac medications before your next appointment, please call your pharmacy.  Thank you for choosing [facility]!!

## 2017-05-10 ENCOUNTER — Ambulatory Visit: Payer: Medicare Other | Admitting: Oncology

## 2017-05-10 ENCOUNTER — Telehealth: Payer: Self-pay | Admitting: Oncology

## 2017-05-10 VITALS — BP 175/76 | HR 58 | Temp 97.9°F | Resp 18 | Ht 64.0 in | Wt 184.5 lb

## 2017-05-10 DIAGNOSIS — R61 Generalized hyperhidrosis: Secondary | ICD-10-CM | POA: Diagnosis not present

## 2017-05-10 DIAGNOSIS — I10 Essential (primary) hypertension: Secondary | ICD-10-CM | POA: Diagnosis not present

## 2017-05-10 DIAGNOSIS — C7931 Secondary malignant neoplasm of brain: Secondary | ICD-10-CM

## 2017-05-10 DIAGNOSIS — C3491 Malignant neoplasm of unspecified part of right bronchus or lung: Secondary | ICD-10-CM

## 2017-05-10 NOTE — Telephone Encounter (Signed)
Gave avs and calendar for January - march 2019

## 2017-05-10 NOTE — Progress Notes (Signed)
  [facility] OFFICE PROGRESS NOTE   Diagnosis: Small cell lung cancer  INTERVAL HISTORY:   Ms. Scardina returns as scheduled.  A brain MRI 04/26/2017 revealed no evidence of progressive disease.  There was decreased enhancement and edema at the treated superior vermis metastasis. She continues to have "sweats ".  Good appetite.  No other complaint.  Objective:  Vital signs in last 24 hours:  Blood pressure (!) 175/76, pulse (!) 58, temperature 97.9 F (36.6 C), temperature source Oral, resp. rate 18, height 5\' 4"  (1.626 m), weight 184 lb 8 oz (83.7 kg), SpO2 100 %.    HEENT: Neck without mass Lymphatics: No cervical, supraclavicular, or axillary nodes Resp: Scattered end inspiratory coarse rhonchi, good air movement bilaterally, no respiratory distress Cardio: Regular rate and rhythm GI: No hepatomegaly, nontender Vascular: No leg edema  Portacath/PICC-without erythema  Medications: I have reviewed the patient's current medications.  Assessment/Plan: 1. Extensive stage small cell lung cancer  resection of right temporal brain mass 06/09/2015 confirmed metastatic small cell carcinoma  Staging PET scan at [facility] 07/22/2015 revealed decreased activity associated with previously seen pulmonary nodules , decreased activity of hypermetabolic mediastinal and bilateral hilar nodes , no new sites of FDG avid metastatic disease compared to a [facility] PET scan from November 2016  Cycle 1 etoposide/carboplatin 08/16/2015  Cycle 2 etoposide/carboplatin 09/14/2015  Cycle 3 etoposide/carboplatin 10/19/2015  Restaging chest CT 11/12/2015 with generally stable appearance of solid, sub-solid and ground glass pulmonary nodules bilaterally. Stable mildly prominent mediastinal lymph nodes. No progressive adenopathy or new findings.  Cycle 4 etoposide/carboplatin 11/16/2015   Brain MRI 01/24/2016-new 4 mm superior vermis metastasis  CT chest 06/07/2016-stable  solid/sub-solid pulmonary nodules, stable mediastinal lymph nodes, no evidence of progressive disease  MRI brain 07/05/2016-progression of the cerebellar metastasis with associated edema and effacement of the upper fourth ventricle   SRS to cerebellarlesion 07/21/2016  MRI brain 10/20/2016-decrease in size and mass effect associated with the cerebellar lesion consistent with posttreatment change   MRI brain 01/22/2017-increased size, enhancement, and edema at the vermis metastasis, started on pentoxifyilline  Chest CT 02/14/2017-stable scattered solid for a nodules, stable small B-cell lymph nodes, improvement in sub-solid lesions in the bilateral upper lobes   Brain MRI 04/26/2017- improvement in the treated vermis metastasis, no new lesions  2. Chronic history of mediastinal lymphadenopathy and pulmonary nodules-diagnostic workup in 2013 including bronchoscopy/EBUS , and mediastinoscopy -negative for malignancy, , granulomatous changes noted  3. Hypertension  4. History of CAD  5. "Sweats "-most likely related to post menopausal hot flashes  6. Port-A-Cath placement 10/26/2015    Disposition: Ms Margaret Hale appears stable.  There is no clinical evidence for progression of the small cell lung cancer.  She will be scheduled for a restaging brain MRI and CT of the chest in early March.  She will return for an office visit after the restaging scans.  15 minutes were spent with the patient today.  The majority of the time was used for counseling and coordination of care.  Betsy Coder, MD  05/10/2017  1:00 PM

## 2017-05-15 ENCOUNTER — Telehealth: Payer: Self-pay | Admitting: *Deleted

## 2017-05-15 NOTE — Telephone Encounter (Signed)
"  I've called several times to reschedule appointments on 1-102019.  I will be out of town.  I can come in 05-30-2017 or 05-31-2017.  I will have to arrange transportation.  Usually someone can bring me after 1:00 pm till 4:00 pm.  Call me at [phone]."  Scheduling message sent.

## 2017-05-16 ENCOUNTER — Encounter: Payer: Self-pay | Admitting: *Deleted

## 2017-05-16 ENCOUNTER — Telehealth: Payer: Self-pay | Admitting: *Deleted

## 2017-05-16 NOTE — Telephone Encounter (Signed)
Notified patient of appointment on 1?07/2017 for lab and flush.

## 2017-05-21 ENCOUNTER — Other Ambulatory Visit: Payer: Self-pay | Admitting: Radiation Therapy

## 2017-05-21 DIAGNOSIS — C7931 Secondary malignant neoplasm of brain: Secondary | ICD-10-CM

## 2017-05-31 ENCOUNTER — Ambulatory Visit (HOSPITAL_BASED_OUTPATIENT_CLINIC_OR_DEPARTMENT_OTHER): Payer: Medicare Other

## 2017-05-31 ENCOUNTER — Other Ambulatory Visit: Payer: Medicare Other

## 2017-05-31 DIAGNOSIS — Z452 Encounter for adjustment and management of vascular access device: Secondary | ICD-10-CM | POA: Diagnosis not present

## 2017-05-31 DIAGNOSIS — C3491 Malignant neoplasm of unspecified part of right bronchus or lung: Secondary | ICD-10-CM | POA: Diagnosis not present

## 2017-05-31 DIAGNOSIS — C7931 Secondary malignant neoplasm of brain: Secondary | ICD-10-CM | POA: Diagnosis not present

## 2017-05-31 DIAGNOSIS — Z95828 Presence of other vascular implants and grafts: Secondary | ICD-10-CM

## 2017-05-31 MED ORDER — HEPARIN SOD (PORK) LOCK FLUSH 100 UNIT/ML IV SOLN
500.0000 [IU] | Freq: Once | INTRAVENOUS | Status: AC | PRN
  Administered 2017-05-31: 500 [IU] via INTRAVENOUS
  Filled 2017-05-31: qty 5

## 2017-05-31 MED ORDER — SODIUM CHLORIDE 0.9 % IJ SOLN
10.0000 mL | INTRAMUSCULAR | Status: DC | PRN
  Administered 2017-05-31: 10 mL via INTRAVENOUS
  Filled 2017-05-31: qty 10

## 2017-06-07 ENCOUNTER — Other Ambulatory Visit: Payer: Medicare Other

## 2017-07-11 ENCOUNTER — Ambulatory Visit: Payer: Medicare Other | Admitting: Cardiology

## 2017-07-19 ENCOUNTER — Inpatient Hospital Stay: Payer: Medicare Other | Attending: Oncology

## 2017-07-19 ENCOUNTER — Other Ambulatory Visit: Payer: Medicare Other

## 2017-07-19 DIAGNOSIS — Z452 Encounter for adjustment and management of vascular access device: Secondary | ICD-10-CM | POA: Insufficient documentation

## 2017-07-19 DIAGNOSIS — C7931 Secondary malignant neoplasm of brain: Secondary | ICD-10-CM | POA: Diagnosis not present

## 2017-07-19 DIAGNOSIS — Z95828 Presence of other vascular implants and grafts: Secondary | ICD-10-CM

## 2017-07-19 DIAGNOSIS — C3491 Malignant neoplasm of unspecified part of right bronchus or lung: Secondary | ICD-10-CM | POA: Diagnosis not present

## 2017-07-19 MED ORDER — HEPARIN SOD (PORK) LOCK FLUSH 100 UNIT/ML IV SOLN
500.0000 [IU] | Freq: Once | INTRAVENOUS | Status: AC | PRN
  Administered 2017-07-19: 500 [IU] via INTRAVENOUS
  Filled 2017-07-19: qty 5

## 2017-07-19 MED ORDER — SODIUM CHLORIDE 0.9 % IJ SOLN
10.0000 mL | INTRAMUSCULAR | Status: DC | PRN
  Administered 2017-07-19: 10 mL via INTRAVENOUS
  Filled 2017-07-19: qty 10

## 2017-07-24 ENCOUNTER — Encounter: Payer: Self-pay | Admitting: Cardiology

## 2017-07-24 ENCOUNTER — Ambulatory Visit: Payer: Medicare Other | Admitting: Cardiology

## 2017-07-24 VITALS — BP 142/84 | HR 58 | Ht 64.0 in | Wt 190.0 lb

## 2017-07-24 DIAGNOSIS — Z789 Other specified health status: Secondary | ICD-10-CM

## 2017-07-24 DIAGNOSIS — Z79899 Other long term (current) drug therapy: Secondary | ICD-10-CM

## 2017-07-24 DIAGNOSIS — E785 Hyperlipidemia, unspecified: Secondary | ICD-10-CM | POA: Diagnosis not present

## 2017-07-24 DIAGNOSIS — I700 Atherosclerosis of aorta: Secondary | ICD-10-CM | POA: Diagnosis not present

## 2017-07-24 MED ORDER — ROSUVASTATIN CALCIUM 10 MG PO TABS: 10.0000 mg | ORAL_TABLET | ORAL | 3 refills | Status: DC

## 2017-07-24 NOTE — Patient Instructions (Signed)
Medication Instructions:  Increase your Crestor to twice a day. Continue all other medications as listed.  Labwork: Please have lipid and ALT today.  Follow-Up: Follow up in 6 months with Truitt Merle, NP.  You will receive a letter in the mail 2 months before you are due.  Please call us when you receive this letter to schedule your follow up appointment.  If you need a refill on your cardiac medications before your next appointment, please call your pharmacy.  Thank you for choosing [facility]!!

## 2017-07-24 NOTE — Progress Notes (Signed)
Cardiology Office Note:    Date:  07/24/2017   ID:  Margaret Hale, DOB Feb 21, 1935, MRN [id number]  PCP:  Margaret Stains, MD  Cardiologist:  Margaret Furbish, MD    Referring MD: Margaret Stains, MD     History of Present Illness:    Margaret Hale is a 83 y.o. female with a hx of coronary disease status post PCI of RCA with bare-metal stent 2007 hypertension hyperlipidemia with prior stress test showing no ischemia.  Chronic jaw pain that was not thought to be cardiac related.  Has hypertension, hyperlipidemia. Unfortunately, she has been seeing oncology second to lung small cell carcinoma.  MRI of the brain no new metastasis.  She was placed on radiation oncology.  Aortic atherosclerosis was noted.  Overall she is not having any anginal symptoms.  She did have classic anginal symptoms back in 2007.  She had some concerns over her recent CT scan which showed extensive aortic and coronary artery atherosclerosis present.  Mitral annular calcification was also discussed as were.  She has been without her mother since age 29.  She died following the war.  She remembers her having a vegetable with every meal.  She eats very well.  No potatoes.  No meat.  07/24/17  -She has been doing very well with her Crestor once a day.  She really has not had any change in her leg pain or back pain.  This is been consistent.  With or without Crestor.  We will go ahead and continue.  In fact she is willing to take twice a week.  This is excellent.  We will check a lipid profile today.  No chest pain, no syncope.  She was concerned about her weight.  She would like to weigh 160 again.  We were talking about plant-based diet/too much fructose from fruit.  No orthopnea bleeding syncope   Past Medical History:  Diagnosis Date  . Adenomatous polyp 02/2009 &02/2012  . Anxiety   . Baker's cyst    L KNEE  . Bulging lumbar disc 1990  . CAD (coronary artery disease) 2007   PCI of RCA, bare metal stent.....Dr.  Radford Hale  . Cancer (HCC)    lung mass  . Disc disorder of lumbar region    2 BULDGING DISCS DX 1990 BY DR. DALLDORF  . Dizzy   . Hyperlipidemia   . Hypertension    Takes Novasc ,Clonidine, & Losartan  . Lipoma of back   . Menopausal flushing   . Multiple thyroid nodules   . No pertinent past medical history    lung mass  occas cough   . Osteoporosis   . Positive test for herpes simplex virus (HSV) antibody   . Sinusitis, chronic   . Spondylosis    NECK  . Thyroid disease    nodules,stable on u/s 2/12  . Vitamin D deficiency     Past Surgical History:  Procedure Laterality Date  . CARPAL TUNNEL RELEASE  06/26/08        04/20/09   RIGHT HAND          LEFT HAND  . CORONARY ANGIOPLASTY     dr h Margaret Hale [phone] with one stent  . CRANIOTOMY Right 06/09/2015   Procedure: CRANIOTOMY TUMOR EXCISION WITH APPLICATION OF CRANIAL NAVIGATION;  Surgeon: Margaret Pies, MD;  Location: [facility];  Service: Neurosurgery;  Laterality: Right;  Right temporal craniotomy for resection of tumor  . EYE SURGERY  01/07/09   LEFT CATARACT  .  EYE SURGERY  01/21/09   RIGHT CATARACT  . FINGER CONTRACTURE RELEASE  8/12    Dr. Amedeo Hale   right hand trigger finger release  . MEDIASTINOSCOPY  08/02/2011   Procedure: MEDIASTINOSCOPY;  Surgeon: Margaret Alcon, MD;  Location: [facility];  Service: Thoracic;  Laterality: N/A;  . right power port placement Right     Current Medications: Current Meds  Medication Sig  . amLODipine (NORVASC) 10 MG tablet Take 10 mg by mouth every evening.   Marland Kitchen aspirin 81 MG tablet Take 81 mg by mouth daily.  . Cholecalciferol (VITAMIN D) 2000 units CAPS Take by mouth.  . hydrochlorothiazide (HYDRODIURIL) 25 MG tablet Take 25 mg by mouth daily.  Marland Kitchen ibuprofen (ADVIL,MOTRIN) 200 MG tablet Take 200 mg by mouth every 6 (six) hours as needed.  . labetalol (NORMODYNE) 100 MG tablet Take 100 mg by mouth daily.  Marland Kitchen lidocaine-prilocaine (EMLA) cream Apply 1 application topically as needed. Apply  to port site 1 hr prior to port access and cover with plastic wrap  . metoprolol succinate (TOPROL-XL) 100 MG 24 hr tablet Take 100 mg by mouth daily. Take with or immediately following a meal.  . MOMETASONE FUROATE EX Apply topically. 2.6% OINTMENT 1 APPLICATION TO AFFECTED AREA EXTERNALLY ONCE A DAY  . olmesartan-hydrochlorothiazide (BENICAR HCT) 40-25 MG tablet Take 1 tablet by mouth daily.  . Omega-3 Fatty Acids (FISH OIL PO) Take 4,000 Units by mouth 2 (two) times daily.  . pentoxifylline (TRENTAL) 400 MG CR tablet Take 1 tablet (400 mg total) by mouth 2 (two) times daily.  Marland Kitchen rOPINIRole (REQUIP) 1 MG tablet Take 1 mg by mouth at bedtime.  Derrill Memo ON 07/26/2017] rosuvastatin (CRESTOR) 10 MG tablet Take 1 tablet (10 mg total) by mouth 2 (two) times a week.  . triamcinolone cream (KENALOG) 0.1 % Apply 1 application topically 3 (three) times daily.   . vitamin E (VITAMIN E) 400 UNIT capsule Take 1 capsule (400 Units total) by mouth 2 (two) times daily.  . [DISCONTINUED] rosuvastatin (CRESTOR) 10 MG tablet Take 1 tablet (10 mg total) by mouth once a week.     Allergies:   Food; Lipitor [atorvastatin calcium]; Tape; and Pantoprazole sodium   Social History   Socioeconomic History  . Marital status: Widowed    Spouse name: None  . Number of children: 2  . Years of education: None  . Highest education level: None  Social Needs  . Financial resource strain: None  . Food insecurity - worry: None  . Food insecurity - inability: None  . Transportation needs - medical: None  . Transportation needs - non-medical: None  Occupational History  . Occupation: Retired  Tobacco Use  . Smoking status: Former Smoker    Packs/day: 0.50    Years: 10.00    Pack years: 5.00    Types: Cigarettes    Last attempt to quit: 07/12/2005    Years since quitting: 12.0  . Smokeless tobacco: Never Used  Substance and Sexual Activity  . Alcohol use: No  . Drug use: No  . Sexual activity: None  Other Topics  Concern  . None  Social History Narrative  . None     Family History: The patient's family history includes Healthy in her son and son. There is no history of Anesthesia problems, Hypotension, Malignant hyperthermia, or Pseudochol deficiency. ROS:   Please see the history of present illness.     All other systems reviewed and are negative.  EKGs/Labs/Other Studies  Reviewed:    The following studies were reviewed today: Lab work, prior office note, stress test reviewed  Cath 10/20/2005  1.  Mild to moderate left main disease with diameter stenosis of 35%      documented by intravascular ultrasound.  2.  Successful bare metal stent of the mid right coronary with reduction in      stenosis from 99% to less than 10% with TIMI grade 3 flow.  There are  two overlapping 3.5 Liberte stents placed.   PLAN:  1.  Plavix for at least 3 weeks.  2.  Continued Integrilin x18 hours.    EKG:  EKG is  ordered today.  The ekg ordered today demonstrates sinus rhythm PAC 64 personally viewed.  Recent Labs: 04/26/2017: Creatinine, Ser 0.70  Recent Lipid Panel    Component Value Date/Time   CHOL 195 10/01/2014 1500   TRIG 63.0 10/01/2014 1500   HDL 74.70 10/01/2014 1500   CHOLHDL 3 10/01/2014 1500   VLDL 12.6 10/01/2014 1500   LDLCALC 108 (H) 10/01/2014 1500    Physical Exam:    VS:  BP (!) 142/84   Pulse (!) 58   Ht 5\' 4"  (1.626 m)   Wt 190 lb (86.2 kg)   SpO2 93%   BMI 32.61 kg/m     Wt Readings from Last 3 Encounters:  07/24/17 190 lb (86.2 kg)  05/10/17 184 lb 8 oz (83.7 kg)  05/09/17 185 lb (83.9 kg)     GEN:  Well nourished, well developed in no acute distress HEENT: Normal NECK: No JVD; No carotid bruits LYMPHATICS: No lymphadenopathy CARDIAC: RRR, no murmurs, rubs, gallops RESPIRATORY:  Clear to auscultation without rales, wheezing or rhonchi  ABDOMEN: Soft, non-tender, non-distended MUSCULOSKELETAL:  No edema; No deformity  SKIN: Warm and dry NEUROLOGIC:  Alert  and oriented x 3 PSYCHIATRIC:  Normal affect   ASSESSMENT:    1. Dyslipidemia   2. Encounter for long-term (current) use of medications   3. Aortic atherosclerosis (HCC)   4. Statin intolerance    PLAN:    In order of problems listed above:  Statin intolerance  -She seems to be doing very well with the Crestor once a week.  She is willing to try twice a week.  For her.  I will check a lipid profile today and ALT.  Plaque stabilization.  She does have extensive aortic atherosclerosis as well as coronary artery calcification noted.  CAD as well.  If she has any severe leg pain, she will let us know. All stable now.   Aortic atherosclerosis  -Statin.  Continue with excellent diet.  She is doing a great job with this. LDL 92. Will check today, ALT.   Mitral annular calcification  -This is sometimes seen especially with aging process.  Should not be of any significant clinical significance at this point. Continue to monitor.   Brain lesion/small cell lung cancer.  -MRI of brain in September 2018 improved.  Post brain surgery. Stable.   Coronary artery disease  -2 overlapping RCA stents placed in the past.  She had moderate left main disease.  No symptoms currently.  No anginal symptoms.  Most recent stress test without any ischemia in 2015.  We will see her back in 6 months APP, 12 months me.   Medication Adjustments/Labs and Tests Ordered: Current medicines are reviewed at length with the patient today.  Concerns regarding medicines are outlined above.  Orders Placed This Encounter  Procedures  . ALT  .  Lipid panel   Meds ordered this encounter  Medications  . rosuvastatin (CRESTOR) 10 MG tablet    Sig: Take 1 tablet (10 mg total) by mouth 2 (two) times a week.    Dispense:  15 tablet    Refill:  3    Signed, Margaret Furbish, MD  07/24/2017 3:37 PM    [facility]

## 2017-07-25 LAB — LIPID PANEL
CHOLESTEROL TOTAL: 226 mg/dL — AB (ref 100–199)
Chol/HDL Ratio: 2.9 ratio (ref 0.0–4.4)
HDL: 79 mg/dL (ref >39)
LDL CALC: 134 mg/dL — AB (ref 0–99)
TRIGLYCERIDES: 66 mg/dL (ref 0–149)
VLDL CHOLESTEROL CAL: 13 mg/dL (ref 5–40)

## 2017-07-25 LAB — ALT: ALT: 22 IU/L (ref 0–32)

## 2017-07-26 NOTE — Progress Notes (Signed)
Extensive stage small cell carcinoma of the lung with brain metastasis SRS brain radiation completed 07-21-16, review 07-27-17 MRI brain, FU.   Headache:No Pain:5/10 bilateral legs, no swelling of legs taking Ibuprofen prn. Dizziness:No Nausea/vomiting:No Ringing in ears:No Visual changes (Blurred/ diplopia double vision,blind spots, and peripheral vsion changes):No Fatigue:Yes  Cognitive changes:Alert and oriented x 3.  Forgetful a times. Wt Readings from Last 3 Encounters:  07/24/17 190 lb (86.2 kg)  05/10/17 184 lb 8 oz (83.7 kg)  05/09/17 185 lb (83.9 kg)

## 2017-07-27 ENCOUNTER — Ambulatory Visit (HOSPITAL_COMMUNITY)
Admission: RE | Admit: 2017-07-27 | Discharge: 2017-07-27 | Disposition: A | Discharge status: Home / Self Care | Payer: Medicare Other | Source: Ambulatory Visit | Attending: Radiation Oncology | Admitting: Radiation Oncology

## 2017-07-27 DIAGNOSIS — C801 Malignant (primary) neoplasm, unspecified: Secondary | ICD-10-CM | POA: Insufficient documentation

## 2017-07-27 DIAGNOSIS — C7931 Secondary malignant neoplasm of brain: Secondary | ICD-10-CM | POA: Diagnosis present

## 2017-07-27 LAB — CREATININE, SERUM
Creatinine, Ser: 0.75 mg/dL (ref 0.44–1.00)
GFR calc Af Amer: >60 mL/min (ref >60)
GFR calc non Af Amer: >60 mL/min (ref >60)

## 2017-07-27 MED ORDER — GADOBENATE DIMEGLUMINE 529 MG/ML IV SOLN
19.0000 mL | Freq: Once | INTRAVENOUS | Status: AC
  Administered 2017-07-27: 19 mL via INTRAVENOUS

## 2017-07-30 ENCOUNTER — Encounter: Payer: Self-pay | Admitting: Radiation Oncology

## 2017-07-30 ENCOUNTER — Ambulatory Visit
Admission: RE | Admit: 2017-07-30 | Discharge: 2017-07-30 | Disposition: A | Discharge status: Home / Self Care | Payer: Medicare Other | Source: Ambulatory Visit | Attending: Radiation Oncology | Admitting: Radiation Oncology

## 2017-07-30 ENCOUNTER — Other Ambulatory Visit: Payer: Self-pay

## 2017-07-30 VITALS — BP 187/71 | HR 56 | Temp 97.6°F | Resp 20 | Ht 64.0 in | Wt 195.0 lb

## 2017-07-30 DIAGNOSIS — Z888 Allergy status to other drugs, medicaments and biological substances status: Secondary | ICD-10-CM | POA: Insufficient documentation

## 2017-07-30 DIAGNOSIS — C3490 Malignant neoplasm of unspecified part of unspecified bronchus or lung: Secondary | ICD-10-CM | POA: Diagnosis present

## 2017-07-30 DIAGNOSIS — Z7982 Long term (current) use of aspirin: Secondary | ICD-10-CM | POA: Insufficient documentation

## 2017-07-30 DIAGNOSIS — Z87891 Personal history of nicotine dependence: Secondary | ICD-10-CM | POA: Diagnosis not present

## 2017-07-30 DIAGNOSIS — Z955 Presence of coronary angioplasty implant and graft: Secondary | ICD-10-CM | POA: Insufficient documentation

## 2017-07-30 DIAGNOSIS — C7931 Secondary malignant neoplasm of brain: Secondary | ICD-10-CM | POA: Diagnosis not present

## 2017-07-30 DIAGNOSIS — I2510 Atherosclerotic heart disease of native coronary artery without angina pectoris: Secondary | ICD-10-CM | POA: Insufficient documentation

## 2017-07-30 DIAGNOSIS — Z79899 Other long term (current) drug therapy: Secondary | ICD-10-CM | POA: Diagnosis not present

## 2017-07-30 DIAGNOSIS — C3491 Malignant neoplasm of unspecified part of right bronchus or lung: Secondary | ICD-10-CM

## 2017-07-30 NOTE — Progress Notes (Signed)
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  Interval Since Last Radiation: 12  months  07/21/16:  PTV1 sup ver 25 mm/ 18 Gy in 1 fraction  Narrative:  The patient returns today for routine follow-up after undergoing SRS to the brain. Her history has been somewhat complicated as she was encouraged to proceed with whole brain radiation when her disease was first noted, however she declined due to overwhelming concerns with cognitive decline if she were to go this route. She had repeat imaging later than the recommended interval due to her interest and avoiding as much imaging at that time. She subsequently been treated for one lesion of the brain with SRS, but does understand the rationale to consider whole brain radiotherapy if her disease progresses.  She has agreed to surveillance MRIs. She does have concerns around the frequency of these MRI and contrast being given. 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 She has done well with this. She underwent repeat imaging on 07/27/2017, and although the read suggested an increase in the change in size of the previously treated site, there was also some concern for edema, but the discussion in conference with that this was more enhancement than enlargement.  Recommendations were for her to proceed with repeat imaging in 3 months time.  Fortunately no new areas were identified, and she comes to review the results today.                   On review of systems, the patient reports that she is doing well overall.  She denies any chest pain, shortness of breath, cough, fevers, chills, night sweats, unintended weight changes. She has occasional headaches and takes tylenol which helps. She denies any visual or auditory disturbances. She denies any bowel or bladder disturbances, and denies abdominal pain, nausea or vomiting. She denies any new musculoskeletal or joint aches or pains, new skin lesions or concerns. A complete review of systems is obtained and is otherwise negative.   Past Medical History:  Past Medical History:  Diagnosis Date  . Adenomatous polyp 02/2009 &02/2012  . Anxiety   . Baker's cyst    L KNEE  . Bulging lumbar disc 1990  . CAD (coronary artery disease) 2007   PCI of RCA, bare metal stent.....Dr. Radford Pax  . Cancer (HCC)    lung mass  . Disc disorder of lumbar region    2 BULDGING DISCS DX 1990 BY DR. DALLDORF  . Dizzy   . Hyperlipidemia   . Hypertension    Takes Novasc ,Clonidine, & Losartan  . Lipoma of back   . Menopausal flushing   . Multiple thyroid nodules   . No pertinent past medical history    lung mass  occas cough   . Osteoporosis   . Positive test for herpes simplex virus (HSV) antibody   . Sinusitis, chronic   . Spondylosis    NECK  . Thyroid disease    nodules,stable on u/s 2/12  . Vitamin D deficiency     Past Surgical History: Past Surgical History:  Procedure Laterality Date  . CARPAL TUNNEL RELEASE  06/26/08        04/20/09   RIGHT HAND          LEFT HAND  . CORONARY ANGIOPLASTY     dr h Tamala Julian [phone] with one stent  . CRANIOTOMY Right 06/09/2015   Procedure: CRANIOTOMY TUMOR EXCISION WITH APPLICATION OF CRANIAL NAVIGATION;  Surgeon: Newman Pies, MD;  Location: [facility];  Service: Neurosurgery;  Laterality: Right;  Right temporal craniotomy for resection of tumor  . EYE SURGERY  01/07/09   LEFT CATARACT  . EYE SURGERY  01/21/09   RIGHT CATARACT  . FINGER CONTRACTURE RELEASE  8/12    Dr. Amedeo Plenty   right hand trigger finger release  .  MEDIASTINOSCOPY  08/02/2011   Procedure: MEDIASTINOSCOPY;  Surgeon: Nicanor Alcon, MD;  Location: [facility];  Service: Thoracic;  Laterality: N/A;  . right power port placement Right     Social History:  Social History   Socioeconomic History  . Marital status: Widowed    Spouse name: Not on file  . Number of children: 2  . Years of education: Not on file  . Highest education level: Not on file  Social Needs  . Financial resource strain: Not on file  . Food insecurity - worry: Not on file  . Food insecurity - inability: Not on file  . Transportation needs - medical: Not on file  . Transportation needs - non-medical: Not on file  Occupational History  . Occupation: Retired  Tobacco Use  . Smoking status: Former Smoker    Packs/day: 0.50    Years: 10.00    Pack years: 5.00    Types: Cigarettes    Last attempt to quit: 07/12/2005    Years since quitting: 12.0  . Smokeless tobacco: Never Used  Substance and Sexual Activity  . Alcohol use: No  . Drug use: No  . Sexual activity: Not on file  Other Topics Concern  . Not on file  Social History Narrative  . Not on file    Family History: Family History  Problem Relation Age of Onset  . Healthy Son   . Healthy Son   . Anesthesia problems Neg Hx   . Hypotension Neg Hx   . Malignant hyperthermia Neg Hx   . Pseudochol deficiency Neg Hx      ALLERGIES:  is allergic to food; lipitor [atorvastatin calcium]; tape; and pantoprazole sodium.  Meds: Current Outpatient Medications  Medication Sig Dispense Refill  . amLODipine (NORVASC) 10 MG tablet Take 10 mg by mouth every evening.     Marland Kitchen aspirin 81 MG tablet Take 81 mg by mouth daily.    . Cholecalciferol (VITAMIN D) 2000 units CAPS Take by mouth.    . hydrochlorothiazide (HYDRODIURIL) 25 MG tablet Take 25 mg by mouth daily.    Marland Kitchen ibuprofen (ADVIL,MOTRIN) 200 MG tablet Take 200 mg by mouth every 6 (six) hours as needed.    . labetalol (NORMODYNE) 100 MG tablet Take 100 mg by  mouth daily.    Marland Kitchen lidocaine-prilocaine (EMLA) cream Apply 1 application topically as needed. Apply to port site 1 hr prior to port access and cover with plastic wrap 30 g 0  . metoprolol succinate (TOPROL-XL) 100 MG 24 hr tablet Take 100 mg by mouth daily. Take with or immediately following a meal.    . olmesartan-hydrochlorothiazide (BENICAR HCT) 40-25 MG tablet Take 1 tablet by mouth daily.    . Omega-3 Fatty Acids (  FISH OIL PO) Take 4,000 Units by mouth 2 (two) times daily.    . pentoxifylline (TRENTAL) 400 MG CR tablet Take 1 tablet (400 mg total) by mouth 2 (two) times daily. 60 tablet 6  . rOPINIRole (REQUIP) 1 MG tablet Take 1 mg by mouth at bedtime.    . rosuvastatin (CRESTOR) 10 MG tablet Take 1 tablet (10 mg total) by mouth 2 (two) times a week. 15 tablet 3  . triamcinolone cream (KENALOG) 0.1 % Apply 1 application topically 3 (three) times daily.     . vitamin E (VITAMIN E) 400 UNIT capsule Take 1 capsule (400 Units total) by mouth 2 (two) times daily. 60 capsule 6  . MOMETASONE FUROATE EX Apply topically. 3.7% OINTMENT 1 APPLICATION TO AFFECTED AREA EXTERNALLY ONCE A DAY     No current facility-administered medications for this encounter.     Physical Findings:  height is 5\' 4"  (1.626 m) and weight is 195 lb (88.5 kg). Her oral temperature is 97.6 F (36.4 C). Her blood pressure is 187/71 (abnormal) and her pulse is 56 (abnormal). Her respiration is 20 and oxygen saturation is 96%.  Pain Assessment Pain Score: 5  Pain Loc: Leg(bilateral legs)/10 In general this is a well appearing [place] European Female in no acute distress. She's alert and oriented x4 and appropriate throughout the examination. She appears to be grossly neurologically intact, without focal change. Cardiopulmonary assessment is negative for acute distress and she exhibits normal effort.    Lab Findings: Lab Results  Component Value Date   WBC 5.2 12/28/2015   HGB 13.9 12/28/2015   HCT 42.2 12/28/2015   MCV  91.1 12/28/2015   PLT 155 12/28/2015     Radiographic Findings: Mr Jeri Cos JI Contrast  Result Date: 07/27/2017 CLINICAL DATA:  83 year old female with metastatic small cell lung cancer. Initial right temporal lobe metastasis treated with surgical resection in January 2017. The patient declined subsequent whole brain radiation. A cerebellar vermis metastasis was treated with SRS in February 2018. Restaging. EXAM: MRI HEAD WITHOUT AND WITH CONTRAST TECHNIQUE: Multiplanar, multiecho pulse sequences of the brain and surrounding structures were obtained without and with intravenous contrast. CONTRAST:  48mL MULTIHANCE GADOBENATE DIMEGLUMINE 529 MG/ML IV SOLN COMPARISON:  Brain MRIs 04/26/2017 and earlier. FINDINGS: BRAIN New Lesions: None. Larger lesions: Treated enhancing lesion located in the superior cerebellar vermis has increased from 14 x 15 x 16 mm (AP by transverse by CC) mm to now 20 x 17 x 20 mm, with increased cerebellar edema. Minimal regional mass effect compared to November. See series 10, image 55, series 12, image 22 and series 11, image 14. Prominent hemosiderin within the lesion which may have increased (series 7, image 11). Stable or Smaller lesions: Curvilinear enhancement along the superior aspect of the right temporal lobe resection cavity, associated with smooth chronic dural thickening and enhancement, seen on series 10, image 92. Other Brain findings: No restricted diffusion to suggest acute infarction. No midline shift, extra-axial collection. Stable ventricle size and configuration. Confluent bilateral cerebral white matter T2 and FLAIR hyperintensity is stable. Cervicomedullary junction and pituitary are within normal limits. Vascular: Major intracranial vascular flow voids are stable. Skull and upper cervical spine: Negative visible cervical spine and spinal cord. Visualized bone marrow signal is within normal limits. Sinuses/Orbits: Stable and negative. Other: Mastoid air cells  remain clear. Visible internal auditory structures appear normal (questionable hyperenhancement of the left 7th/8th cranial nerves on series 10, image 53 is not correlated on sagittal post-contrast  imaging and felt to be artifact). No acute scalp or face soft tissue finding. IMPRESSION: 1. Progression of the superior cerebellar vermis lesion since November with increased size and surrounding edema. The lesion may have hemorrhaged into itself since November, but is solidly enhancing. The appearance is indeterminate for true progression of disease. 2. Stable post treatment appearance of the right temporal lobe. 3. No new brain metastasis identified. Electronically Signed   By: Genevie Ann M.D.   On: 07/27/2017 20:03    Impression/Plan: 1. Extensive stage small cell carcinoma of the lung with brain metastasis.   We reviewed her MRI scan result we reviewed her MRI scan results, and in conference her case was discussed, and it was felts to be more concerning for enhancement rather than size change in the area of her previous treatment, however recommendations were to follow this area on her next serial scan in 3 months time.  She is to have repeat staging scans with Dr. Benay Spice next week.  She will keep [place] informed of any questions or concerns that arise prior to her next visit.   2. Radionecrosis. She will also continue tramadol 400 mg twice a day for radionecrosis.   Carola Rhine, PAC

## 2017-08-02 ENCOUNTER — Ambulatory Visit (HOSPITAL_COMMUNITY)
Admission: RE | Admit: 2017-08-02 | Discharge: 2017-08-02 | Disposition: A | Discharge status: Home / Self Care | Payer: Medicare Other | Source: Ambulatory Visit | Attending: Oncology | Admitting: Oncology

## 2017-08-02 DIAGNOSIS — J439 Emphysema, unspecified: Secondary | ICD-10-CM | POA: Diagnosis not present

## 2017-08-02 DIAGNOSIS — C3491 Malignant neoplasm of unspecified part of right bronchus or lung: Secondary | ICD-10-CM | POA: Diagnosis present

## 2017-08-02 DIAGNOSIS — R918 Other nonspecific abnormal finding of lung field: Secondary | ICD-10-CM | POA: Diagnosis not present

## 2017-08-02 DIAGNOSIS — I700 Atherosclerosis of aorta: Secondary | ICD-10-CM | POA: Insufficient documentation

## 2017-08-02 DIAGNOSIS — Z85118 Personal history of other malignant neoplasm of bronchus and lung: Secondary | ICD-10-CM | POA: Insufficient documentation

## 2017-08-03 ENCOUNTER — Telehealth: Payer: Self-pay | Admitting: *Deleted

## 2017-08-03 NOTE — Telephone Encounter (Signed)
-----   Message from Ladell Pier, MD sent at 08/03/2017 11:06 AM EST ----- Please call patient, CT shows no evidence of progressive cancer, follow-up as scheduled

## 2017-08-03 NOTE — Telephone Encounter (Signed)
Notified pt of CT results per MD note below. She voiced appreciation for call.

## 2017-08-09 ENCOUNTER — Encounter: Payer: Self-pay | Admitting: Oncology

## 2017-08-09 ENCOUNTER — Inpatient Hospital Stay: Payer: Medicare Other | Attending: Oncology | Admitting: Oncology

## 2017-08-09 ENCOUNTER — Telehealth: Payer: Self-pay | Admitting: Oncology

## 2017-08-09 VITALS — BP 134/91 | HR 58 | Temp 97.8°F | Resp 18 | Ht 64.0 in

## 2017-08-09 DIAGNOSIS — C3491 Malignant neoplasm of unspecified part of right bronchus or lung: Secondary | ICD-10-CM | POA: Diagnosis not present

## 2017-08-09 DIAGNOSIS — C7931 Secondary malignant neoplasm of brain: Secondary | ICD-10-CM | POA: Insufficient documentation

## 2017-08-09 DIAGNOSIS — I10 Essential (primary) hypertension: Secondary | ICD-10-CM | POA: Insufficient documentation

## 2017-08-09 NOTE — Telephone Encounter (Signed)
Gave patient avs and calendar with appts per 3/14 los.

## 2017-08-09 NOTE — Progress Notes (Signed)
[facility] OFFICE PROGRESS NOTE   Diagnosis: Small cell lung cancer  INTERVAL HISTORY:   [name] returns as scheduled.  She feels well.  Good appetite.  She is working in her yard.  She reports leg pain after ambulating 10-15 minutes.  No back pain. She underwent a restaging brain MRI 07/27/2017.  No new lesions.  The cerebellar lesion has increased in size with increased edema.  She saw radiation oncology.  The plan is to follow her with observation.  The MRI findings are felt to potentially be secondary to post treatment change.  Restaging chest CT 08/03/2017 revealed stable bilateral pulmonary nodules.  No findings for progressive disease.    Objective:  Vital signs in last 24 hours:  Blood pressure (!) 134/91, pulse (!) 58, temperature 97.8 F (36.6 C), temperature source Oral, resp. rate 18, height 5\' 4"  (1.626 m), SpO2 99 %.    HEENT: Neck without mass Lymphatics: No cervical, supraclavicular, or axillary nodes Resp: End inspiratory rhonchi at the left posterior base, no respiratory distress Cardio: Regular rate and rhythm GI: No hepatosplenomegaly Vascular: No leg edema  Portacath/PICC-without erythema   Medications: I have reviewed the patient's current medications.   Assessment/Plan: 1. Extensive stage small cell lung cancer  resection of right temporal brain mass 06/09/2015 confirmed metastatic small cell carcinoma  Staging PET scan at [facility] 07/22/2015 revealed decreased activity associated with previously seen pulmonary nodules , decreased activity of hypermetabolic mediastinal and bilateral hilar nodes , no new sites of FDG avid metastatic disease compared to a [facility] PET scan from November 2016  Cycle 1 etoposide/carboplatin 08/16/2015  Cycle 2 etoposide/carboplatin 09/14/2015  Cycle 3 etoposide/carboplatin 10/19/2015  Restaging chest CT 11/12/2015 with generally stable appearance of solid, sub-solid and ground glass pulmonary  nodules bilaterally. Stable mildly prominent mediastinal lymph nodes. No progressive adenopathy or new findings.  Cycle 4 etoposide/carboplatin 11/16/2015   Brain MRI 01/24/2016-new 4 mm superior vermis metastasis  CT chest 06/07/2016-stable solid/sub-solid pulmonary nodules, stable mediastinal lymph nodes, no evidence of progressive disease  MRI brain 07/05/2016-progression of the cerebellar metastasis with associated edema and effacement of the upper fourth ventricle   SRS to cerebellarlesion 07/21/2016  MRI brain 10/20/2016-decrease in size and mass effect associated with the cerebellar lesion consistent with posttreatment change  MRI brain 01/22/2017-increased size, enhancement, and edema at the vermis metastasis, started onpentoxifyilline  Chest CT 02/14/2017-stable scattered solid for a nodules, stable small B-cell lymph nodes, improvement in sub-solid lesions in the bilateral upper lobes  Brain MRI 04/26/2017- improvement in the treated vermis metastasis, no new lesions  Brain MRI 07/27/2017-increased size in edema associated with cerebellar lesion, observation recommended  CT chest 08/03/2017- stable bilateral lung nodules, no evidence for disease progression  2. Chronic history of mediastinal lymphadenopathy and pulmonary nodules-diagnostic workup in 2013 including bronchoscopy/EBUS , and mediastinoscopy -negative for malignancy, , granulomatous changes noted  3. Hypertension  4. History of CAD  5. "Sweats "-most likely related to post menopausal hot flashes  6. Port-A-Cath placement 10/26/2015    Disposition: She appears unchanged.  She is in clinical remission from small cell lung cancer.  The restaging brain MRI on 07/27/2017 revealed an apparent increase in the size of the cerebellar metastasis.  She saw radiation oncology and the plan is to continue observation with a repeat MRI in 3 months.  She will return for a Port-A-Cath flush in 5  weeks and an office visit in approximately 3 months.  We will plan for a restaging chest  CT at a six-month interval.  15 minutes were spent with the patient today.  The majority of the time was used for counseling and coordination of care.  Betsy Coder, MD  08/09/2017  3:12 PM

## 2017-09-07 ENCOUNTER — Inpatient Hospital Stay: Payer: Medicare Other | Attending: Oncology

## 2017-09-07 DIAGNOSIS — Z452 Encounter for adjustment and management of vascular access device: Secondary | ICD-10-CM | POA: Insufficient documentation

## 2017-09-07 DIAGNOSIS — C3491 Malignant neoplasm of unspecified part of right bronchus or lung: Secondary | ICD-10-CM | POA: Diagnosis present

## 2017-09-07 DIAGNOSIS — Z95828 Presence of other vascular implants and grafts: Secondary | ICD-10-CM

## 2017-09-07 DIAGNOSIS — C7931 Secondary malignant neoplasm of brain: Secondary | ICD-10-CM | POA: Diagnosis present

## 2017-09-07 MED ORDER — HEPARIN SOD (PORK) LOCK FLUSH 100 UNIT/ML IV SOLN
500.0000 [IU] | Freq: Once | INTRAVENOUS | Status: AC | PRN
  Administered 2017-09-07: 500 [IU] via INTRAVENOUS
  Filled 2017-09-07: qty 5

## 2017-09-07 MED ORDER — SODIUM CHLORIDE 0.9 % IJ SOLN
10.0000 mL | INTRAMUSCULAR | Status: DC | PRN
  Administered 2017-09-07: 10 mL via INTRAVENOUS
  Filled 2017-09-07: qty 10

## 2017-09-24 ENCOUNTER — Other Ambulatory Visit: Payer: Self-pay

## 2017-09-24 ENCOUNTER — Ambulatory Visit
Admission: RE | Admit: 2017-09-24 | Discharge: 2017-09-24 | Disposition: A | Discharge status: Home / Self Care | Payer: Medicare Other | Source: Ambulatory Visit | Attending: Radiation Oncology | Admitting: Radiation Oncology

## 2017-09-24 ENCOUNTER — Encounter: Payer: Self-pay | Admitting: Radiation Oncology

## 2017-09-24 VITALS — BP 165/74 | HR 59 | Temp 97.7°F | Resp 18 | Ht 64.0 in | Wt 185.0 lb

## 2017-09-24 DIAGNOSIS — C7931 Secondary malignant neoplasm of brain: Secondary | ICD-10-CM

## 2017-09-24 DIAGNOSIS — C3491 Malignant neoplasm of unspecified part of right bronchus or lung: Secondary | ICD-10-CM

## 2017-09-24 DIAGNOSIS — Z452 Encounter for adjustment and management of vascular access device: Secondary | ICD-10-CM | POA: Diagnosis not present

## 2017-09-24 MED ORDER — POLYMYXIN B-TRIMETHOPRIM 10000-0.1 UNIT/ML-% OP SOLN: 1.0000 [drp] | Freq: Four times a day (QID) | OPHTHALMIC | 0 refills | Status: DC

## 2017-09-24 NOTE — Progress Notes (Addendum)
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  Interval Since Last Radiation: 14 months  07/21/16:  PTV1 sup ver 25 mm/ 18 Gy in 1 fraction  Narrative:  The patient returns today for routine follow-up after undergoing SRS to the brain. Her history has been somewhat complicated as she was encouraged to proceed with whole brain radiation when her disease was first noted, however she declined due to overwhelming concerns with cognitive decline if she were to go this route. She had repeat imaging later than the recommended interval due to her interest and avoiding as much imaging at that time. She subsequently been treated for one lesion of the brain with SRS, but does understand the rationale to consider whole brain radiotherapy if her disease progresses.  She has agreed to surveillance MRIs. She does have concerns around the frequency of these MRI and contrast being given. 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 She has done well with this and has discontinued use of both. She is due for MRI in June, but called last week stating she was feeling "off" and wanted to be seen. Her MRI has been rescheduled to Wednesday of this week.                   On review of systems, the patient reports that she is doing well overall. She is concerned though about feeling off balance. She denies any falls or issues with her gait during the day, but early in the morning, or sometimes after sitting long periods, she feels wobbly. She denies any alcohol or drug use. She does  take benadryl sometimes at night but has not noticed any recent progressive symptoms dependent on taking this medication. She denies any headaches, changes in visual acuity, or hearing. She has had redness in her right eye since getting dirt in her eye while gardening over the weekend. She denies any chest pain, shortness of breath, cough, fevers, chills, night sweats, unintended weight changes. She denies any bowel or bladder disturbances, and denies abdominal pain, nausea or vomiting. She denies any new musculoskeletal or joint aches or pains, new skin lesions or concerns. A complete review of systems is obtained and is otherwise negative.  Past Medical History:  Past Medical History:  Diagnosis Date  . Adenomatous polyp 02/2009 &02/2012  . Anxiety   . Baker's cyst    L KNEE  . Bulging lumbar disc 1990  . CAD (coronary artery disease) 2007   PCI of RCA, bare metal stent.....Dr. Radford Pax  . Cancer (HCC)    lung mass  . Disc disorder of lumbar region    2 BULDGING DISCS DX 1990 BY DR. DALLDORF  . Dizzy   . Hyperlipidemia   . Hypertension    Takes Novasc ,Clonidine, & Losartan  . Lipoma of back   . Menopausal flushing   . Multiple thyroid nodules   . No pertinent past medical history    lung mass  occas cough   . Osteoporosis   . Positive test for herpes simplex virus (HSV) antibody   .  Sinusitis, chronic   . Spondylosis    NECK  . Thyroid disease    nodules,stable on u/s 2/12  . Vitamin D deficiency     Past Surgical History: Past Surgical History:  Procedure Laterality Date  . CARPAL TUNNEL RELEASE  06/26/08        04/20/09   RIGHT HAND          LEFT HAND  . CORONARY ANGIOPLASTY     dr h Tamala Julian [phone] with one stent  . CRANIOTOMY Right 06/09/2015   Procedure: CRANIOTOMY TUMOR EXCISION WITH APPLICATION OF CRANIAL NAVIGATION;  Surgeon: Newman Pies, MD;  Location: [facility];  Service: Neurosurgery;  Laterality: Right;  Right temporal craniotomy for resection of tumor  .  EYE SURGERY  01/07/09   LEFT CATARACT  . EYE SURGERY  01/21/09   RIGHT CATARACT  . FINGER CONTRACTURE RELEASE  8/12    Dr. Amedeo Plenty   right hand trigger finger release  . MEDIASTINOSCOPY  08/02/2011   Procedure: MEDIASTINOSCOPY;  Surgeon: Nicanor Alcon, MD;  Location: [facility];  Service: Thoracic;  Laterality: N/A;  . right power port placement Right     Social History:  Social History   Socioeconomic History  . Marital status: Widowed    Spouse name: Not on file  . Number of children: 2  . Years of education: Not on file  . Highest education level: Not on file  Occupational History  . Occupation: Retired  Scientific laboratory technician  . Financial resource strain: Not on file  . Food insecurity:    Worry: Not on file    Inability: Not on file  . Transportation needs:    Medical: Not on file    Non-medical: Not on file  Tobacco Use  . Smoking status: Former Smoker    Packs/day: 0.50    Years: 10.00    Pack years: 5.00    Types: Cigarettes    Last attempt to quit: 07/12/2005    Years since quitting: 12.2  . Smokeless tobacco: Never Used  Substance and Sexual Activity  . Alcohol use: No  . Drug use: No  . Sexual activity: Not on file  Lifestyle  . Physical activity:    Days per week: Not on file    Minutes per session: Not on file  . Stress: Not on file  Relationships  . Social connections:    Talks on phone: Not on file    Gets together: Not on file    Attends religious service: Not on file    Active member of club or organization: Not on file    Attends meetings of clubs or organizations: Not on file    Relationship status: Not on file  . Intimate partner violence:    Fear of current or ex partner: Not on file    Emotionally abused: Not on file    Physically abused: Not on file    Forced sexual activity: Not on file  Other Topics Concern  . Not on file  Social History Narrative  . Not on file    Family History: Family History  Problem Relation Age of Onset  . Healthy Son    . Healthy Son   . Anesthesia problems Neg Hx   . Hypotension Neg Hx   . Malignant hyperthermia Neg Hx   . Pseudochol deficiency Neg Hx      ALLERGIES:  is allergic to food; lipitor [atorvastatin calcium]; tape; and pantoprazole sodium.  Meds: Current Outpatient Medications  Medication Sig Dispense Refill  . amLODipine (NORVASC) 10 MG tablet Take 10 mg by mouth every evening.     Marland Kitchen aspirin 81 MG tablet Take 81 mg by mouth daily.    . Cholecalciferol (VITAMIN D) 2000 units CAPS Take by mouth.    . hydrochlorothiazide (HYDRODIURIL) 25 MG tablet Take 25 mg by mouth daily.    Marland Kitchen labetalol (NORMODYNE) 100 MG tablet Take 100 mg by mouth daily.    Marland Kitchen lidocaine-prilocaine (EMLA) cream Apply 1 application topically as needed. Apply to port site 1 hr prior to port access and cover with plastic wrap 30 g 0  . metoprolol succinate (TOPROL-XL) 100 MG 24 hr tablet Take 100 mg by mouth daily. Take with or immediately following a meal.    . MOMETASONE FUROATE EX Apply topically. 3.5% OINTMENT 1 APPLICATION TO AFFECTED AREA EXTERNALLY ONCE A DAY    . olmesartan-hydrochlorothiazide (BENICAR HCT) 40-25 MG tablet Take 1 tablet by mouth daily.    . Omega-3 Fatty Acids (FISH OIL PO) Take 4,000 Units by mouth 2 (two) times daily.    Marland Kitchen rOPINIRole (REQUIP) 1 MG tablet Take 1 mg by mouth at bedtime.    . rosuvastatin (CRESTOR) 10 MG tablet Take 1 tablet (10 mg total) by mouth 2 (two) times a week. 15 tablet 3  . triamcinolone cream (KENALOG) 0.1 % Apply 1 application topically 3 (three) times daily.     . vitamin E (VITAMIN E) 400 UNIT capsule Take 1 capsule (400 Units total) by mouth 2 (two) times daily. 60 capsule 6  . ibuprofen (ADVIL,MOTRIN) 200 MG tablet Take 200 mg by mouth every 6 (six) hours as needed.    . trimethoprim-polymyxin b (POLYTRIM) ophthalmic solution Place 1 drop into both eyes every 6 (six) hours. 10 mL 0   No current facility-administered medications for this encounter.     Physical  Findings:  height is 5\' 4"  (1.626 m) and weight is 185 lb (83.9 kg). Her oral temperature is 97.7 F (36.5 C). Her blood pressure is 165/74 (abnormal) and her pulse is 59 (abnormal). Her respiration is 18 and oxygen saturation is 98%.  Pain Assessment Pain Score: 0-No pain/10 In general this is a well appearing [place] European Female in no acute distress. She's alert and oriented x4 and appropriate throughout the examination. HEENT reveals she is normocephalic, atraumatic. EOMs are intact, and PERRLA bilaterally. That being said she has erythema of the right sclera and has crusting and drainge of the right eye, and early features of scleral injection of the left eye. She does not have obvious changes in visual acuity though formal testing is not available in the office.  She appears to be grossly neurologically intact, without focal change. Cardiopulmonary assessment is negative for acute distress and she exhibits normal effort.    Lab Findings: Lab Results  Component Value Date   WBC 5.2 12/28/2015   HGB 13.9 12/28/2015   HCT 42.2 12/28/2015   MCV 91.1 12/28/2015   PLT 155 12/28/2015     Radiographic Findings: No results found.  Impression/Plan: 1. Extensive stage small cell carcinoma of the lung with brain metastasis. The patient has done well. On examination today she appears neurologically intact and cerebellar testing appears clinically negative. I encouraged her to proceed with her MRI on Wednesday and we will review results by phone. If negative, I think she should see Dr. Mickeal Skinner to ensure no other neurologic conditions could be causing her symptoms. We will plan to continue MRI  at q 3 month intervals. She also has decided she will no longer accept contrast with MRIs due to her concerns of retained contrast in the blood/brain. We have had multiple conversations about this in the past and we will proceed without contrast at her request. 2. Radionecrosis. She is not taking Trental  currently but we can resume this if there are persistent changes with radionecrosis on her next scan. 3. Conjunctivitis. I called in a prescription to her pharmacy for Polytrim Ophthalmic solution to use 1 drop bilateral eyes 4 x a day for 1 week. We will follow this expectantly but if this does not improve she will call her ophthalmologist.     Carola Rhine, PAC

## 2017-09-24 NOTE — Progress Notes (Addendum)
Extensivestagesmallcellcarcinoma of the lung with brain metastasisSRS brain radiation completed 07-21-16, review 07-27-17 MRI brain, FU.   Headache:No Pain: denies pain while sitting,8-9/10 bilateral legs and and hips while walking for the past two years, no swelling of legs stopped taking Ibuprofen prn. Feels like her muscles are moving. Dizziness:No Nausea/vomiting:No Ringing in ears:No Visual changes (Blurred/ diplopia double vision,blind spots, and peripheral vsion changes): Right eye with redness and swelling of  eye lids top and bottom with redness since Saturday said she was working out in her garden right eye itchy closed in the morning with crusty drainage does not know if there is color Fatigue:Yes  Cognitive changes:Alert and oriented x 3.  Forgetful a times. Wt Readings from Last 3 Encounters:  09/24/17 185 lb (83.9 kg)  07/30/17 195 lb (88.5 kg)  07/24/17 190 lb (86.2 kg)   BP (!) 165/74 (BP Location: Right Arm, Patient Position: Sitting, Cuff Size: Normal)   Pulse (!) 59   Temp 97.7 F (36.5 C) (Oral)   Resp 18   Ht 5\' 4"  (1.626 m)   Wt 185 lb (83.9 kg)   SpO2 98%   BMI 31.76 kg/m

## 2017-09-25 ENCOUNTER — Other Ambulatory Visit: Payer: Self-pay | Admitting: Radiation Therapy

## 2017-09-25 DIAGNOSIS — C7949 Secondary malignant neoplasm of other parts of nervous system: Principal | ICD-10-CM

## 2017-09-25 DIAGNOSIS — C7931 Secondary malignant neoplasm of brain: Secondary | ICD-10-CM

## 2017-09-26 ENCOUNTER — Ambulatory Visit (HOSPITAL_COMMUNITY)
Admission: RE | Admit: 2017-09-26 | Discharge: 2017-09-26 | Disposition: A | Discharge status: Home / Self Care | Payer: Medicare Other | Source: Ambulatory Visit | Attending: Radiation Oncology | Admitting: Radiation Oncology

## 2017-09-26 ENCOUNTER — Ambulatory Visit (HOSPITAL_COMMUNITY): Payer: Medicare Other

## 2017-09-26 DIAGNOSIS — C801 Malignant (primary) neoplasm, unspecified: Secondary | ICD-10-CM | POA: Diagnosis not present

## 2017-09-26 DIAGNOSIS — C7949 Secondary malignant neoplasm of other parts of nervous system: Secondary | ICD-10-CM | POA: Diagnosis not present

## 2017-09-26 DIAGNOSIS — C7931 Secondary malignant neoplasm of brain: Secondary | ICD-10-CM | POA: Diagnosis present

## 2017-09-27 ENCOUNTER — Telehealth: Payer: Self-pay | Admitting: Radiation Oncology

## 2017-09-27 MED ORDER — DEXAMETHASONE 4 MG PO TABS: 4.0000 mg | ORAL_TABLET | Freq: Two times a day (BID) | ORAL | 0 refills | Status: DC

## 2017-09-27 NOTE — Telephone Encounter (Signed)
I spoke with the patient regarding the MRI results and rationale to begin steroids. She will take 4 mg BID and we will discuss her case in brain oncology conference Monday.

## 2017-10-02 IMAGING — MR MR HEAD WO/W CM
10 of 13 series · 34 of 48 positions shown · IV contrast (multihance)
Comparison: PET-CT 04/07/2015.  Brain MRI 07/21/2011.

CLINICAL DATA: Right temporal lobe lesion on recent PET-CT.

EXAM:
MRI HEAD WITHOUT AND WITH CONTRAST
TECHNIQUE: Multiplanar, multiecho pulse sequences of the brain and surrounding
structures were obtained without and with intravenous contrast.
CONTRAST:  14mL MULTIHANCE GADOBENATE DIMEGLUMINE 529 MG/ML IV SOLN

[Series 4: T1 · sagittal · 5.0mm · 0.47mm/px · 1 of 27 slices shown]
[im 1/27]
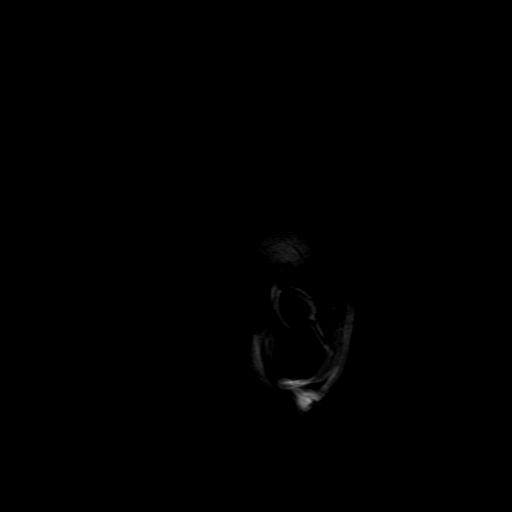

[Series 5: DWI · axial · 3.0mm · 1.09mm/px · z∈[-25,+128]mm · 9 of 104 slices shown (1 of 4)]
[im 1/104]
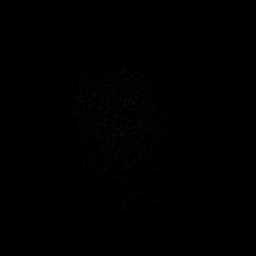
[im 13/104]
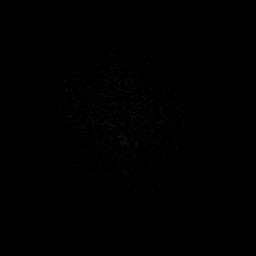
[im 26/104]
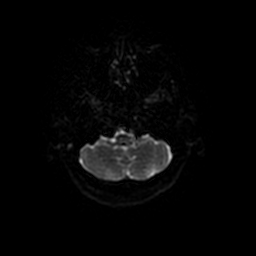
[im 39/104]
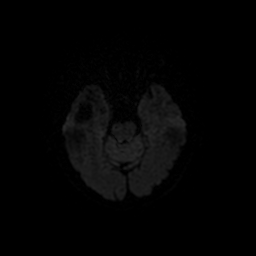
[im 52/104]
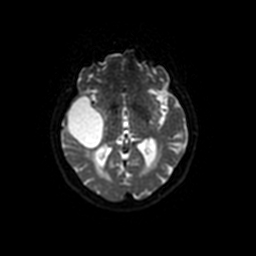
[im 65/104]
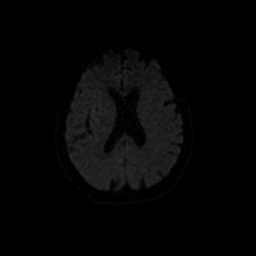
[im 78/104]
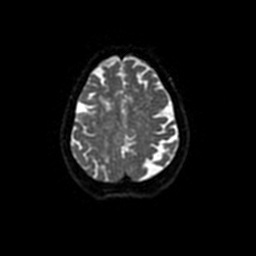
[im 91/104]
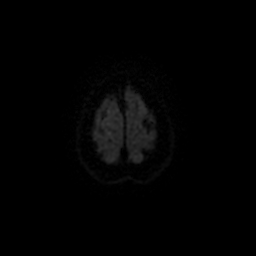
[im 104/104]
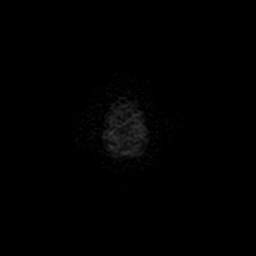

[Series 6: DWI · coronal · 5.0mm · 1.09mm/px · 6 of 70 slices shown (2 of 4)]
[im 1/70]
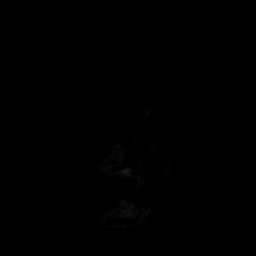
[im 14/70]
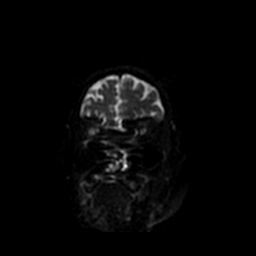
[im 28/70]
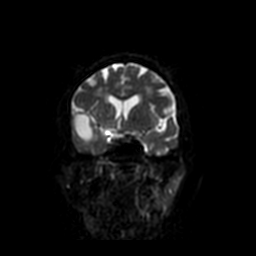
[im 42/70]
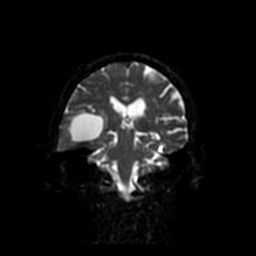
[im 56/70]
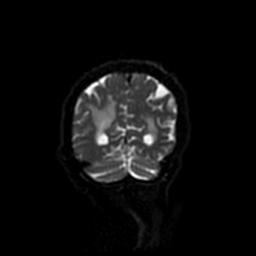
[im 70/70]
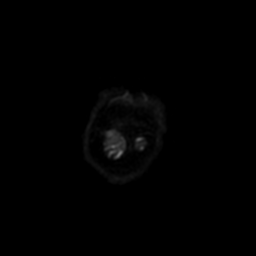

[Series 7: T2 · axial · 5.0mm · 0.43mm/px · z∈[-13,+143]mm · 2 of 25 slices shown]
[im 1/25]
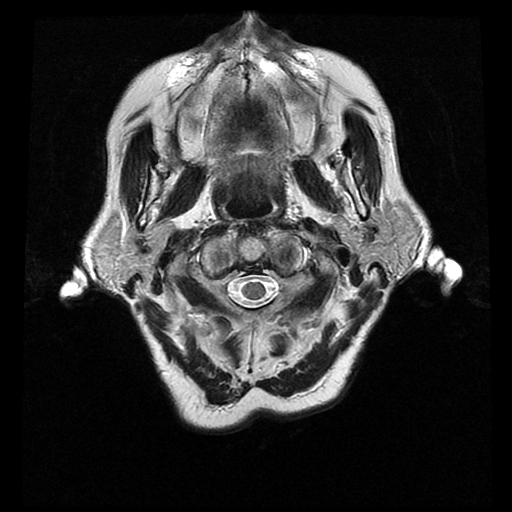
[im 25/25]
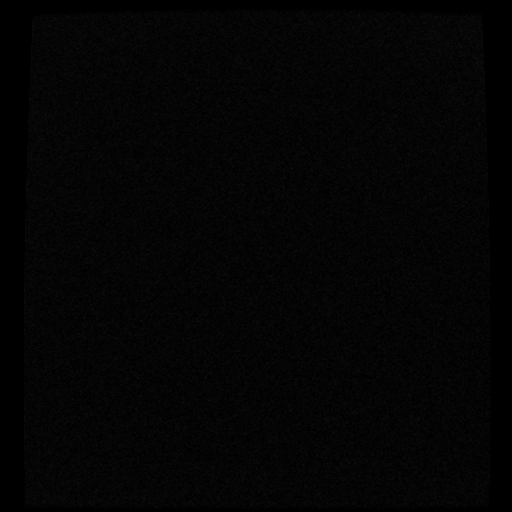

[Series 8: FLAIR · axial · 5.0mm · 0.43mm/px · z∈[-12,+142]mm · 2 of 23 slices shown]
[im 1/23]
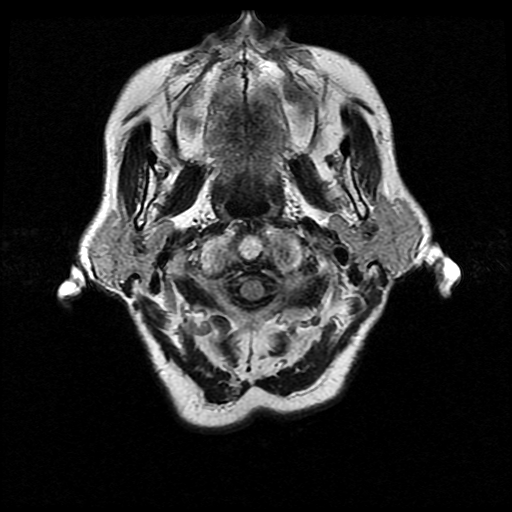
[im 23/23]
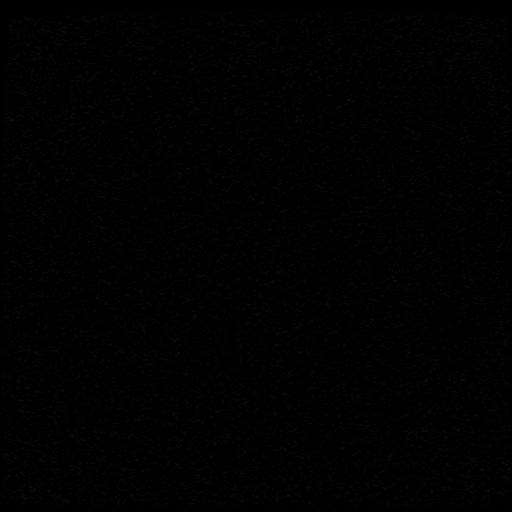

[Series 11: T2 post-contrast · coronal · 5.0mm · 0.45mm/px · 2 of 28 slices shown]
[im 1/28]
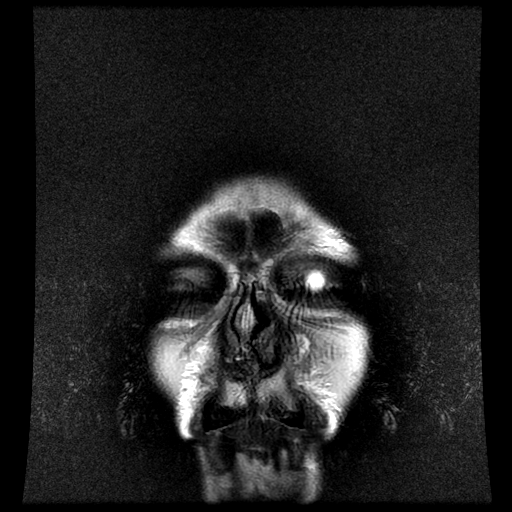
[im 28/28]
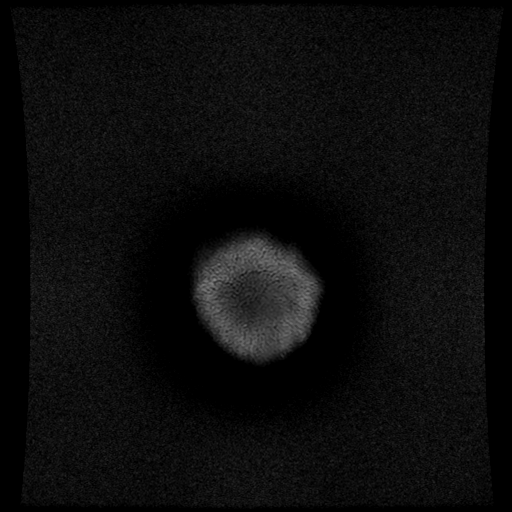

[Series 13: T1 post-contrast · coronal · 5.0mm · 0.45mm/px · 2 of 28 slices shown (1 of 2)]
[im 1/28]
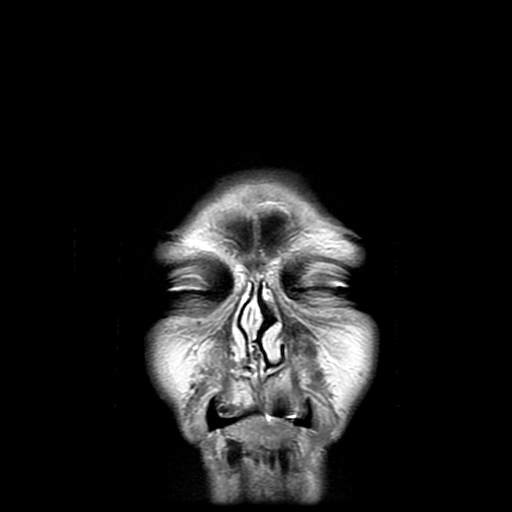
[im 28/28]
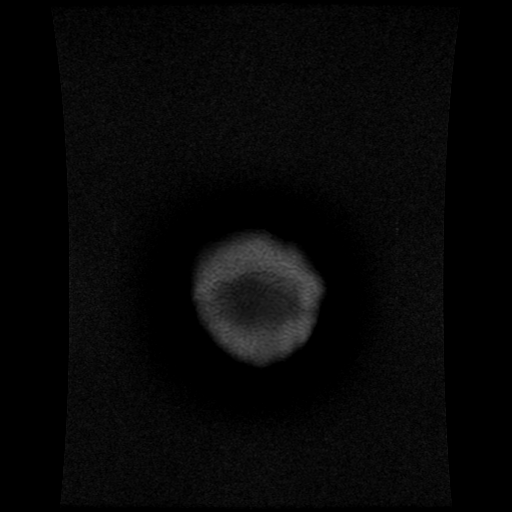

[Series 14: T1 post-contrast · sagittal · 5.0mm · 0.47mm/px · 2 of 27 slices shown (2 of 2)]
[im 1/27]
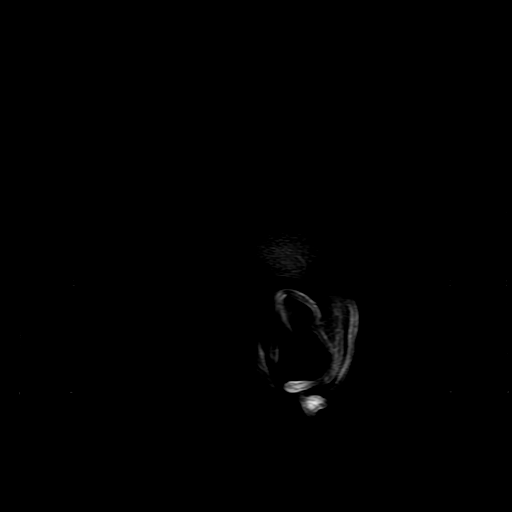
[im 27/27]
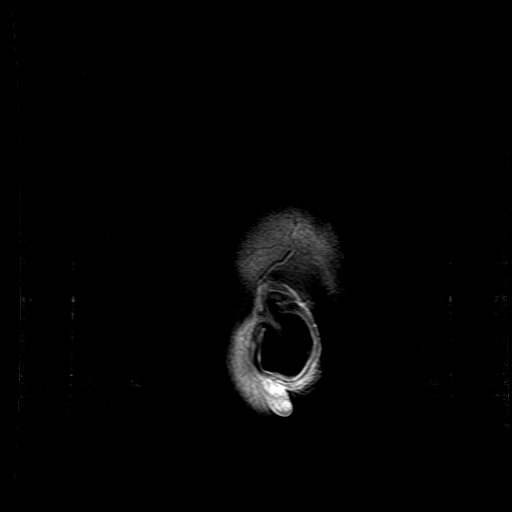

[Series 500: DWI · axial · 3.0mm · 1.09mm/px · z∈[-25,+128]mm · 5 of 52 slices shown (3 of 4)]
[im 1/52]
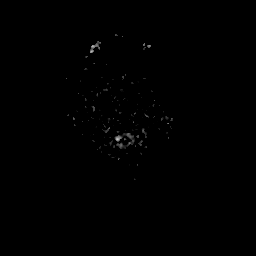
[im 13/52]
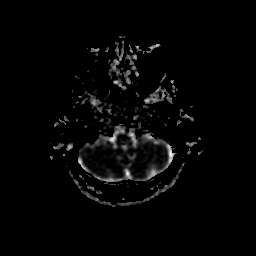
[im 26/52]
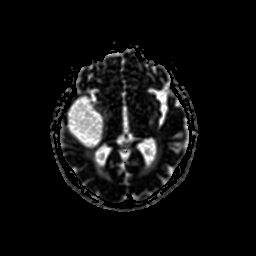
[im 39/52]
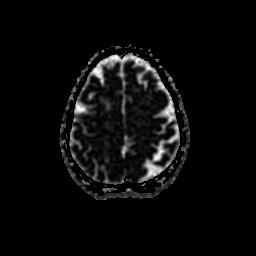
[im 52/52]
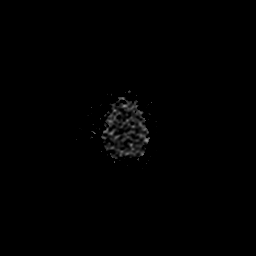

[Series 600: DWI · coronal · 5.0mm · 1.09mm/px · 3 of 35 slices shown (4 of 4)]
[im 1/35]
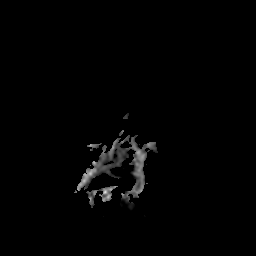
[im 18/35]
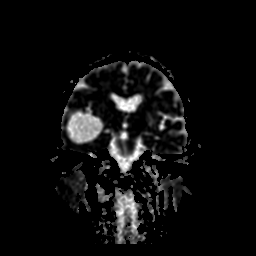
[im 35/35]
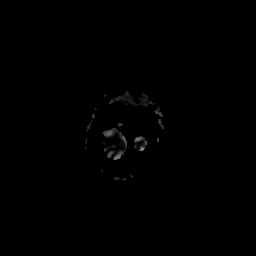

[34 of 48 positions shown; findings below may reference images not displayed]

FINDINGS: There is no evidence of acute infarct, intracranial hemorrhage, or
extra-axial fluid collection. Mild-to-moderate global cerebral
atrophy is again seen. Patchy to confluent T2 hyperintensities are
again seen in the periventricular greater than subcortical cerebral
white matter bilaterally, compatible with moderate chronic small
vessel ischemic disease.

As seen on recent PET-CT, there is an intra-axial mass in the right
temporal lobe which is new from the prior MRI. The mass measures
x 4.1 x 3.9 cm and is largely cystic with thin peripheral
enhancement. However, there are 2 enhancing mural soft tissue
nodules. The largest measures 1.9 x 1.0 cm along the anterosuperior
aspect of the lesion, while the smaller measures 8 mm along the
inferolateral aspect. There is very mild surrounding edema which
slightly extends into the internal and external capsules. Minimal
leftward midline shift measures 2-3 mm. No other enhancing brain
lesions are seen.

Prior bilateral cataract extraction is noted. The paranasal sinuses
are clear. There is a trace right mastoid effusion which is chronic.
Major intracranial vascular flow voids are preserved.
IMPRESSION: 1. 5.6 cm cystic mass with mural nodules in the right temporal lobe
with very mild surrounding edema. Primary considerations include
low-grade primary CNS neoplasm and cystic metastasis, with the
former favored.
2. Moderate chronic small vessel ischemic disease.

## 2017-10-03 ENCOUNTER — Telehealth: Payer: Self-pay | Admitting: Radiation Oncology

## 2017-10-03 NOTE — Telephone Encounter (Signed)
-----   Message from Cori Razor, RN sent at 10/03/2017  1:11 PM EDT ----- Regarding: Questions She called and is asking for you to call her if you have something new.  She said " I don't know anything about what the future is for me".  She said if she did not answer, you can call her son Elly Modena [phone].  Phineas Real

## 2017-10-03 NOTE — Telephone Encounter (Signed)
I spoke with the patient's son regarding our review of her scan. The patient's son is aware the recommendations are for either whole brain radiotherapy versus waiting 2 months and repeating her scan. Dr. Lisbeth Renshaw is leaning toward whole brain radiotherapy. He requests that we see the patient to discuss this further. We will meet tomorrow at 1pm

## 2017-10-04 ENCOUNTER — Ambulatory Visit
Admission: RE | Admit: 2017-10-04 | Discharge: 2017-10-04 | Disposition: A | Discharge status: Home / Self Care | Payer: Medicare Other | Source: Ambulatory Visit | Attending: Radiation Oncology | Admitting: Radiation Oncology

## 2017-10-04 ENCOUNTER — Other Ambulatory Visit: Payer: Self-pay | Admitting: Radiation Oncology

## 2017-10-04 ENCOUNTER — Encounter: Payer: Self-pay | Admitting: Radiation Oncology

## 2017-10-04 VITALS — BP 174/63 | HR 66 | Temp 98.5°F | Resp 18 | Ht 64.0 in | Wt 185.0 lb

## 2017-10-04 DIAGNOSIS — Z7982 Long term (current) use of aspirin: Secondary | ICD-10-CM | POA: Insufficient documentation

## 2017-10-04 DIAGNOSIS — C7931 Secondary malignant neoplasm of brain: Secondary | ICD-10-CM | POA: Diagnosis present

## 2017-10-04 DIAGNOSIS — I10 Essential (primary) hypertension: Secondary | ICD-10-CM | POA: Insufficient documentation

## 2017-10-04 DIAGNOSIS — Z87891 Personal history of nicotine dependence: Secondary | ICD-10-CM | POA: Insufficient documentation

## 2017-10-04 DIAGNOSIS — Z79899 Other long term (current) drug therapy: Secondary | ICD-10-CM | POA: Insufficient documentation

## 2017-10-04 DIAGNOSIS — E559 Vitamin D deficiency, unspecified: Secondary | ICD-10-CM | POA: Diagnosis not present

## 2017-10-04 DIAGNOSIS — I2510 Atherosclerotic heart disease of native coronary artery without angina pectoris: Secondary | ICD-10-CM | POA: Insufficient documentation

## 2017-10-04 DIAGNOSIS — M810 Age-related osteoporosis without current pathological fracture: Secondary | ICD-10-CM | POA: Insufficient documentation

## 2017-10-04 DIAGNOSIS — E079 Disorder of thyroid, unspecified: Secondary | ICD-10-CM | POA: Insufficient documentation

## 2017-10-04 DIAGNOSIS — R609 Edema, unspecified: Secondary | ICD-10-CM | POA: Insufficient documentation

## 2017-10-04 DIAGNOSIS — E785 Hyperlipidemia, unspecified: Secondary | ICD-10-CM | POA: Insufficient documentation

## 2017-10-04 DIAGNOSIS — Z86010 Personal history of colonic polyps: Secondary | ICD-10-CM | POA: Diagnosis not present

## 2017-10-04 DIAGNOSIS — F419 Anxiety disorder, unspecified: Secondary | ICD-10-CM | POA: Diagnosis not present

## 2017-10-04 DIAGNOSIS — C3491 Malignant neoplasm of unspecified part of right bronchus or lung: Secondary | ICD-10-CM | POA: Insufficient documentation

## 2017-10-04 MED ORDER — DEXAMETHASONE 4 MG PO TABS: 4.0000 mg | ORAL_TABLET | Freq: Two times a day (BID) | ORAL | 0 refills | Status: DC

## 2017-10-05 NOTE — Progress Notes (Addendum)
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  Interval Since Last Radiation: 14 months  07/21/16:  PTV1 sup ver 25 mm/ 18 Gy in 1 fraction  Narrative:  The patient returns today for routine follow-up after undergoing SRS to the brain. Her history has been somewhat complicated as she was encouraged to proceed with whole brain radiation when her disease was first noted, however she declined due to overwhelming concerns with cognitive decline if she were to go this route. She had repeat imaging later than the recommended interval due to her interest and avoiding as much imaging at that time. She subsequently been treated for one lesion of the brain with SRS, but does understand the rationale to consider whole brain radiotherapy if her disease progresses.  She has agreed to surveillance MRIs. She does have concerns around the frequency of these MRI and contrast being given. 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 She has done well with this and has discontinued use of both. She did not agree to a contrasted MRI but underwent without on 09/26/17 after being seen on 09/24/17. This did reveal concern for progressive soft tissue density in the site of her previous disease with localized edema. The study was limited by the lack of contrast. Recommendations were to consider whole brain radiotherapy or more conformal approach of radiotherapy to the site in the cerebellum. She comes with her son to discuss this further. Once we know her initial results, she was started  on Dexamethasone 4 mg BID and continues this.             On review of systems, the patient reports she's doing much better. Her right eye has cleared up and no redness or edema is now seen. She denies headaches or gait disturbances since taking her steroids. No other complaints are noted.  Past Medical History:  Past Medical History:  Diagnosis Date  . Adenomatous polyp 02/2009 &02/2012  . Anxiety   . Baker's cyst    L KNEE  . Bulging lumbar disc 1990  . CAD (coronary artery disease) 2007   PCI of RCA, bare metal stent.....Dr. Radford Pax  . Cancer (HCC)    lung mass  . Disc disorder of lumbar region    2 BULDGING DISCS DX 1990 BY DR. DALLDORF  . Dizzy   . Hyperlipidemia   . Hypertension    Takes Novasc ,Clonidine, & Losartan  . Lipoma of back   . Menopausal flushing   . Multiple thyroid nodules   . No pertinent past medical history    lung mass  occas cough   . Osteoporosis   . Positive test for herpes simplex virus (HSV) antibody   . Sinusitis, chronic   . Spondylosis    NECK  . Thyroid disease    nodules,stable on u/s 2/12  . Vitamin D deficiency     Past Surgical History: Past Surgical History:  Procedure Laterality Date  . CARPAL TUNNEL RELEASE  06/26/08        04/20/09   RIGHT HAND  LEFT HAND  . CORONARY ANGIOPLASTY     dr h Tamala Julian [phone] with one stent  . CRANIOTOMY Right 06/09/2015   Procedure: CRANIOTOMY TUMOR EXCISION WITH APPLICATION OF CRANIAL NAVIGATION;  Surgeon: Newman Pies, MD;  Location: [facility];  Service: Neurosurgery;  Laterality: Right;  Right temporal craniotomy for resection of tumor  . EYE SURGERY  01/07/09   LEFT CATARACT  . EYE SURGERY  01/21/09   RIGHT CATARACT  . FINGER CONTRACTURE RELEASE  8/12    Dr. Amedeo Plenty   right hand trigger finger release  . MEDIASTINOSCOPY  08/02/2011   Procedure: MEDIASTINOSCOPY;  Surgeon: Nicanor Alcon, MD;  Location: [facility];  Service: Thoracic;  Laterality: N/A;  . right power port placement Right      Social History:  Social History   Socioeconomic History  . Marital status: Widowed    Spouse name: Not on file  . Number of children: 2  . Years of education: Not on file  . Highest education level: Not on file  Occupational History  . Occupation: Retired  Scientific laboratory technician  . Financial resource strain: Not on file  . Food insecurity:    Worry: Not on file    Inability: Not on file  . Transportation needs:    Medical: Not on file    Non-medical: Not on file  Tobacco Use  . Smoking status: Former Smoker    Packs/day: 0.50    Years: 10.00    Pack years: 5.00    Types: Cigarettes    Last attempt to quit: 07/12/2005    Years since quitting: 12.2  . Smokeless tobacco: Never Used  Substance and Sexual Activity  . Alcohol use: No  . Drug use: No  . Sexual activity: Not on file  Lifestyle  . Physical activity:    Days per week: Not on file    Minutes per session: Not on file  . Stress: Not on file  Relationships  . Social connections:    Talks on phone: Not on file    Gets together: Not on file    Attends religious service: Not on file    Active member of club or organization: Not on file    Attends meetings of clubs or organizations: Not on file    Relationship status: Not on file  . Intimate partner violence:    Fear of current or ex partner: Not on file    Emotionally abused: Not on file    Physically abused: Not on file    Forced sexual activity: Not on file  Other Topics Concern  . Not on file  Social History Narrative  . Not on file    Family History: Family History  Problem Relation Age of Onset  . Healthy Son   . Healthy Son   . Anesthesia problems Neg Hx   . Hypotension Neg Hx   . Malignant hyperthermia Neg Hx   . Pseudochol deficiency Neg Hx      ALLERGIES:  is allergic to food; lipitor [atorvastatin calcium]; tape; and pantoprazole sodium.  Meds: Current Outpatient Medications  Medication Sig Dispense Refill  . amLODipine (NORVASC) 10 MG  tablet Take 10 mg by mouth every evening.     Marland Kitchen aspirin 81 MG tablet Take 81 mg by mouth daily.    . Cholecalciferol (VITAMIN D) 2000 units CAPS Take by mouth.    . dexamethasone (DECADRON) 4 MG tablet Take 1 tablet (4 mg total) by mouth 2 (two) times daily. [place]  tablet 0  . hydrochlorothiazide (HYDRODIURIL) 25 MG tablet Take 25 mg by mouth daily.    Marland Kitchen ibuprofen (ADVIL,MOTRIN) 200 MG tablet Take 200 mg by mouth every 6 (six) hours as needed.    . labetalol (NORMODYNE) 100 MG tablet Take 100 mg by mouth daily.    Marland Kitchen lidocaine-prilocaine (EMLA) cream Apply 1 application topically as needed. Apply to port site 1 hr prior to port access and cover with plastic wrap 30 g 0  . metoprolol succinate (TOPROL-XL) 100 MG 24 hr tablet Take 100 mg by mouth daily. Take with or immediately following a meal.    . MOMETASONE FUROATE EX Apply topically. 2.2% OINTMENT 1 APPLICATION TO AFFECTED AREA EXTERNALLY ONCE A DAY    . olmesartan-hydrochlorothiazide (BENICAR HCT) 40-25 MG tablet Take 1 tablet by mouth daily.    . Omega-3 Fatty Acids (FISH OIL PO) Take 4,000 Units by mouth 2 (two) times daily.    Marland Kitchen rOPINIRole (REQUIP) 1 MG tablet Take 1 mg by mouth at bedtime.    . rosuvastatin (CRESTOR) 10 MG tablet Take 1 tablet (10 mg total) by mouth 2 (two) times a week. 15 tablet 3  . triamcinolone cream (KENALOG) 0.1 % Apply 1 application topically 3 (three) times daily.     Marland Kitchen trimethoprim-polymyxin b (POLYTRIM) ophthalmic solution Place 1 drop into both eyes every 6 (six) hours. 10 mL 0  . vitamin E (VITAMIN E) 400 UNIT capsule Take 1 capsule (400 Units total) by mouth 2 (two) times daily. 60 capsule 6   No current facility-administered medications for this encounter.     Physical Findings:  height is 5\' 4"  (1.626 m) and weight is 185 lb (83.9 kg). Her oral temperature is 98.5 F (36.9 C). Her blood pressure is 174/63 (abnormal) and her pulse is 66. Her respiration is 18.   /10 In general this is a well appearing  Turner Female in no acute distress. She's alert and oriented x4 and appropriate throughout the examination. HEENT reveals she is normocephalic, atraumatic. EOMs are intact, and her sclera has cleared bilaterally. She appears to be grossly neurologically intact, without focal change. Cardiopulmonary assessment is negative for acute distress and she exhibits normal effort.    Lab Findings: Lab Results  Component Value Date   WBC 5.2 12/28/2015   HGB 13.9 12/28/2015   HCT 42.2 12/28/2015   MCV 91.1 12/28/2015   PLT 155 12/28/2015     Radiographic Findings: Mr Brain Wo Contrast  Addendum Date: 09/26/2017   ADDENDUM REPORT: 09/26/2017 17:23 ADDENDUM: Study discussed by telephone with Dr. Jenny Reichmann MOODY on 09/26/2017 at 1720 hours. Electronically Signed   By: Genevie Ann M.D.   On: 09/26/2017 17:23   Result Date: 09/26/2017 CLINICAL DATA:  83 year old female with metastatic small cell lung cancer. Initial right temporal lobe metastasis treated with surgical resection in January 2017. The patient declined subsequent whole brain radiation. A cerebellar vermis metastasis was treated with SRS in February 2018. Clinically radiation necrosis was suspected at that site following treatment. At some point she discontinued vitamin-E and Trental. In March 2019 the vermis lesion was increased in size and surrounding edema although may have hemorrhaged into itself since November. Restaging. The patient declined IV contrast for today's exam. EXAM: MRI HEAD WITHOUT CONTRAST TECHNIQUE: Multiplanar, multiecho pulse sequences of the brain and surrounding structures were obtained without intravenous contrast. COMPARISON:  07/27/2017 and earlier. FINDINGS: Brain: In the absence of IV contrast, the superior vermis lesion has further enlarged since March  measuring 16-18 millimeters diameter now on T2 weighted imaging, versus 12-14 millimeters on the same imaging sequences previously. At the same time, cerebellar edema has  progressed and now appears moderate to severe bilaterally (series 6, image 15 and series 10, image 22 today). The lesion continues to show prominent hemosiderin deposition which has not significantly changed. Fourth ventricle size has mildly decreased, but no interval enlargement of the lateral or 3rd ventricles is evident. The basilar cisterns remain patent. Stable noncontrast MRI appearance of the lateral right temporal lobe treatment site including regional T2 and FLAIR hyperintensity. Patchy and confluent bilateral cerebral hemisphere nonspecific T2 and FLAIR increased signal appears stable. No new intracranial blood products identified. No restricted diffusion to suggest acute infarction. No midline shift, extra-axial collection or acute intracranial hemorrhage. Cervicomedullary junction and pituitary are within normal limits. Vascular: Major intracranial vascular flow voids are stable. The distal left vertebral artery is dominant. Skull and upper cervical spine: Stable and negative visible cervical spine. Bone marrow signal remains within normal limits. Sinuses/Orbits: Stable and negative. Other: Mastoid air cells remain clear. Visible internal auditory structures appear normal. Negative scalp soft tissues. IMPRESSION: 1. The patient declined IV contrast today. 2. Further enlargement of the superior cerebellar vermis lesion since 07/27/2017, with progressed and now severe edema throughout the bilateral superior cerebellum. Although suspicious for true progression of disease, as the patient has been off Vitamin-E and Trental (for an unspecified period of time), Progressive radiation necrosis remains possible. 3. Stable noncontrast MRI appearance of the brain elsewhere, no new or other active metastatic disease is evident. Electronically Signed: By: Genevie Ann M.D. On: 09/26/2017 16:47    Impression/Plan: 1. Extensive stage small cell carcinoma of the lung with brain metastasis. The patient has done well  despite not receiving whole brain radiotherapy, however there is local failure at the prior treatment site. Dr. Lisbeth Renshaw discusses that the noncontrasted study limits the full assessment and does not rule out the possibility of other disease within the brain. That being said, we discussed whole brain radiotherapy versus conformal radiotherapy to the lesion. Dr. Lisbeth Renshaw does review with the patient that if she received conformal treatment, this would eliminate whole brain radiotherapy as a treatment option at a later time. The patient is specific on that she would not accept whole brain radiotherapy due to her concerns about toxicity, so this would not be a reason to avoid conformal treatment. The additional option is to proceed with contrasted MRI in a few weeks to determine treatment planning, if she had additional disease, whole brain radiotherapy would be the only recommended treatment. She asks multiple times for other medication to help with the symptoms, which Dr. Lisbeth Renshaw outlines is the steroid that she appears to have seen benefit from, but he details that this is meant to be a short term therapy to decrease the edema near the tumor. She understands this is not a cancer treatment and does not prevent cancer form growing in the current site or in others. She will discuss her options with her son and I will reach out to them next week to see how she'd like to proceed.  In a visit lasting 25 minutes, greater than 50% of the time was spent face to face discussing her case, and coordinating the patient's care.  The above documentation reflects my direct findings during this shared patient visit. Please see the separate note by Dr. Lisbeth Renshaw on this date for the remainder of the patient's plan of care.  Carola Rhine, PAC

## 2017-10-18 ENCOUNTER — Telehealth: Payer: Self-pay | Admitting: Radiation Oncology

## 2017-10-18 ENCOUNTER — Telehealth: Payer: Self-pay | Admitting: Oncology

## 2017-10-18 ENCOUNTER — Telehealth: Payer: Self-pay

## 2017-10-18 NOTE — Telephone Encounter (Signed)
Pt son is aware of appt date and time for port appt.

## 2017-10-18 NOTE — Telephone Encounter (Signed)
Called to talk to pt regarding flush appt. Per Dr. Benay Spice, ok to schedule pt for flush. Will send scheduling message for day of office visit. Pt voiced understanding. Can call pt or son with time.

## 2017-10-18 NOTE — Telephone Encounter (Signed)
I called the patient's son Margaret Hale to follow up and they met today with Dr. Arnoldo Morale. He recommended proceeding with repeat MRI with contrast and if tumor, consider repeating SRS treatment, if necrosis, treating symptomatically. We will coordinate perfusion MRI with contrast and discuss at conference.

## 2017-10-19 ENCOUNTER — Other Ambulatory Visit: Payer: Self-pay | Admitting: Radiation Therapy

## 2017-10-19 DIAGNOSIS — C7949 Secondary malignant neoplasm of other parts of nervous system: Principal | ICD-10-CM

## 2017-10-19 DIAGNOSIS — C7931 Secondary malignant neoplasm of brain: Secondary | ICD-10-CM

## 2017-10-23 ENCOUNTER — Ambulatory Visit (HOSPITAL_COMMUNITY)
Admission: RE | Admit: 2017-10-23 | Discharge: 2017-10-23 | Disposition: A | Discharge status: Home / Self Care | Payer: Medicare Other | Source: Ambulatory Visit | Attending: Radiation Oncology | Admitting: Radiation Oncology

## 2017-10-23 DIAGNOSIS — C7931 Secondary malignant neoplasm of brain: Secondary | ICD-10-CM | POA: Diagnosis present

## 2017-10-23 DIAGNOSIS — R600 Localized edema: Secondary | ICD-10-CM | POA: Insufficient documentation

## 2017-10-23 DIAGNOSIS — R930 Abnormal findings on diagnostic imaging of skull and head, not elsewhere classified: Secondary | ICD-10-CM | POA: Diagnosis not present

## 2017-10-23 DIAGNOSIS — C7949 Secondary malignant neoplasm of other parts of nervous system: Secondary | ICD-10-CM | POA: Insufficient documentation

## 2017-10-23 LAB — CREATININE, SERUM: CREATININE: 0.84 mg/dL (ref 0.44–1.00)

## 2017-10-23 MED ORDER — HEPARIN SOD (PORK) LOCK FLUSH 100 UNIT/ML IV SOLN
500.0000 [IU] | INTRAVENOUS | Status: AC | PRN
  Administered 2017-10-23: 500 [IU]

## 2017-10-23 MED ORDER — GADOBENATE DIMEGLUMINE 529 MG/ML IV SOLN
20.0000 mL | Freq: Once | INTRAVENOUS | Status: AC | PRN
  Administered 2017-10-23: 20 mL via INTRAVENOUS

## 2017-10-30 ENCOUNTER — Ambulatory Visit (HOSPITAL_COMMUNITY): Payer: Medicare Other

## 2017-10-31 ENCOUNTER — Encounter: Payer: Self-pay | Admitting: Radiation Oncology

## 2017-10-31 NOTE — Progress Notes (Signed)
Location/Histology of Brain Tumor:  Right Temporal  Lobe mass  , Primary lung cancer  Patient presented with symptoms of: Patient stated she is "feeling off" and wanted to come in sooner to be seen.  Restaging MRI brain 07/27/2017: No new lesions.  The cerebellar lesion has increased in size with increased edema.  Restaging CT chest 08/03/2017: Stable bilateral pulmonary nodules.  No findings for progressive disease.  MRI Brain 09/26/2017: 1. The patient declined IV contrast today. 2. Further enlargement of the superior cerebellar vermis lesion since 07/27/2017, with progressed and now severe edema throughout the bilateral superior cerebellum. Although suspicious for true progression of disease, as the patient has been off Vitamin-E and Trental (for an unspecified period of time), Progressive radiation necrosis remains possible. 3. Stable noncontrast MRI appearance of the brain elsewhere, no new or other active metastatic disease is evident.  MRI Brain 10/23/2017: 1. Superior vermis abnormal enhancement is increased in size from 04/26/2017. There is a central masslike component measuring 16 mm stable from 09/26/2017 with a focus of increased cerebral blood volume indicating probable viable tumor. Surrounding the central masslike component there is ill-defined non masslike enhancement measuring up to 30 mm with hemosiderin deposition and decreased relative cerebral blood volume compatible with necrosis. 2. 6 mm focus of enhancement along the ependyma of right thalamus, previously punctate on prior MRI diffusion weighted sequences. This may represent new metastasis or CSF dissemination. Attention at follow-up recommended. 3. Stable edema within the vermis and bilateral cerebellar hemispheres. 4. Stable extensive white matter disease compatible with chronic microvascular ischemic changes and/or posttreatment changes. 5. Stable resection cavity in right temporal lobe and scarring at the cavity  margin.  Past or anticipated interventions, if any, per neurosurgery:  06/09/2015:Procedure: Right frontotemporal parietal craniotomy for resection of brain tumors using microdissection and Brain[facility] neuro navigation  Diagnosis 06/09/2015: Dr. Earle Gell, MD 1. Brain, for tumor resection - METASTATIC HIGH GRADE POORLY DIFFERENTIATED CARCINOMA WITH NEUROENDOCRINE DIFFERENTIATION CONSISTENT WITH LUNG PRIMARY, SEE COMMENT. 2. Brain, for tumor resection - METASTATIC HIGH GRADE POORLY DIFFERENTIATED CARCINOMA WITH NEUROENDOCRINE DIFFERENTIATION CONSISTENT WITH LUNG PRIMARY, SEE COMMENT.   Past or anticipated interventions, if any, per medical oncology:  Dr. Benay Spice 08/09/2017 She appears unchanged.  She is in clinical remission from small cell lung cancer.  The restaging brain MRI on 07/27/2017 revealed an apparent increase in the size of the cerebellar metastasis.  She saw radiation oncology and the plan is to continue observation with a repeat MRI in 3 months.  She will return for a Port-A-Cath flush in 5 weeks and an office visit in approximately 3 months.  We will plan for a restaging chest CT at a six-month interval.  Appointment 11/01/2017 3:30 pm.  Past or anticipated interventions, if any, per radiation oncology: Dr. Lisbeth Renshaw / Shona Simpson PA 10/04/2017 -Extensive stage small cell carcinoma of the lung with brain metastasis. -The patient has done well despite not receiving whole brain radiotherapy, however there is local failure at the prior treatment site.  -Dr. Lisbeth Renshaw discusses that the noncontrasted study limits the full assessment and does not rule out the possibility of other disease within the brain.  -That being said, we discussed whole brain radiotherapy versus conformal radiotherapy to the lesion. Dr. Lisbeth Renshaw does review with the patient that if she received conformal treatment, this would eliminate whole brain radiotherapy as a treatment option at a later time. The patient is specific on  that she would not accept whole brain radiotherapy due to her concerns about  toxicity, so this would not be a reason to avoid conformal treatment. -The additional option is to proceed with contrasted MRI in a few weeks to determine treatment planning, if she had additional disease, whole brain radiotherapy would be the only recommended treatment.  -She asks multiple times for other medication to help with the symptoms, which Dr. Lisbeth Renshaw outlines is the steroid that she appears to have seen benefit from, but he details that this is meant to be a short term therapy to decrease the edema near the tumor. She understands this is not a cancer treatment and does not prevent cancer form growing in the current site or in others.  -She will discuss her options with her son and I will reach out to them next week to see how she'd like to proceed. -If we are thinking about a more focal treatment to the dominant tumor.  We could treat this in a focal way to 30 Gy in 10 fractions as well.  PA Dara Lords 10/18/2017 I called the patient's son Ronalee Belts to follow up and they met today with Dr. Arnoldo Morale. He recommended proceeding with repeat MRI with contrast and if tumor, consider repeating SRS treatment, if necrosis, treating symptomatically. We will coordinate perfusion MRI with contrast and discuss at conference.   Dose of Decadron, if applicable: Dexamethasone 4 mg BID  Recent neurologic symptoms, if any:   Seizures: No  Headaches: No  Nausea: No  Dizziness/ataxia: Starting today she feels like her brain is in a fog "something is not clear"  Difficulty with hand coordination: None in the hands, mostly the legs and feet.  Focal numbness/weakness: No  Visual deficits/changes: No  Confusion/Memory deficits: No   Painful bone metastases at present, if any: N/A  SAFETY ISSUES:  Prior radiation? 07/21/2016: PTVI sup ver 25 mm/ 18 Gy in 1 fraction  Pacemaker/ICD? No  Possible current pregnancy? No  Is the  patient on methotrexate? No  Additional Complaints / other details: Patient states that starting this morning around 10 am her balance was suddenly off prompting her to use her cane.  Up until this am she states she was fine.

## 2017-11-01 ENCOUNTER — Inpatient Hospital Stay: Payer: Medicare Other

## 2017-11-01 ENCOUNTER — Inpatient Hospital Stay: Payer: Medicare Other | Attending: Oncology | Admitting: Oncology

## 2017-11-01 ENCOUNTER — Encounter: Payer: Self-pay | Admitting: Radiation Oncology

## 2017-11-01 ENCOUNTER — Ambulatory Visit
Admission: RE | Admit: 2017-11-01 | Discharge: 2017-11-01 | Disposition: A | Discharge status: Home / Self Care | Payer: Medicare Other | Source: Ambulatory Visit | Attending: Radiation Oncology | Admitting: Radiation Oncology

## 2017-11-01 ENCOUNTER — Other Ambulatory Visit: Payer: Self-pay

## 2017-11-01 ENCOUNTER — Ambulatory Visit (HOSPITAL_COMMUNITY): Payer: Medicare Other

## 2017-11-01 ENCOUNTER — Telehealth: Payer: Self-pay | Admitting: *Deleted

## 2017-11-01 VITALS — BP 178/73 | HR 53 | Temp 97.7°F | Resp 18 | Ht 64.0 in | Wt 190.0 lb

## 2017-11-01 DIAGNOSIS — Z86010 Personal history of colonic polyps: Secondary | ICD-10-CM | POA: Insufficient documentation

## 2017-11-01 DIAGNOSIS — J329 Chronic sinusitis, unspecified: Secondary | ICD-10-CM | POA: Diagnosis not present

## 2017-11-01 DIAGNOSIS — C7931 Secondary malignant neoplasm of brain: Secondary | ICD-10-CM | POA: Diagnosis present

## 2017-11-01 DIAGNOSIS — Z79899 Other long term (current) drug therapy: Secondary | ICD-10-CM | POA: Diagnosis not present

## 2017-11-01 DIAGNOSIS — M479 Spondylosis, unspecified: Secondary | ICD-10-CM | POA: Diagnosis not present

## 2017-11-01 DIAGNOSIS — E559 Vitamin D deficiency, unspecified: Secondary | ICD-10-CM | POA: Insufficient documentation

## 2017-11-01 DIAGNOSIS — C3491 Malignant neoplasm of unspecified part of right bronchus or lung: Secondary | ICD-10-CM | POA: Insufficient documentation

## 2017-11-01 DIAGNOSIS — E785 Hyperlipidemia, unspecified: Secondary | ICD-10-CM | POA: Diagnosis not present

## 2017-11-01 DIAGNOSIS — I10 Essential (primary) hypertension: Secondary | ICD-10-CM | POA: Diagnosis not present

## 2017-11-01 DIAGNOSIS — R609 Edema, unspecified: Secondary | ICD-10-CM | POA: Insufficient documentation

## 2017-11-01 DIAGNOSIS — Z87891 Personal history of nicotine dependence: Secondary | ICD-10-CM | POA: Insufficient documentation

## 2017-11-01 DIAGNOSIS — Z7982 Long term (current) use of aspirin: Secondary | ICD-10-CM | POA: Diagnosis not present

## 2017-11-01 DIAGNOSIS — I2510 Atherosclerotic heart disease of native coronary artery without angina pectoris: Secondary | ICD-10-CM | POA: Insufficient documentation

## 2017-11-01 DIAGNOSIS — E079 Disorder of thyroid, unspecified: Secondary | ICD-10-CM | POA: Insufficient documentation

## 2017-11-01 DIAGNOSIS — R61 Generalized hyperhidrosis: Secondary | ICD-10-CM | POA: Insufficient documentation

## 2017-11-01 DIAGNOSIS — F419 Anxiety disorder, unspecified: Secondary | ICD-10-CM | POA: Insufficient documentation

## 2017-11-01 DIAGNOSIS — E041 Nontoxic single thyroid nodule: Secondary | ICD-10-CM | POA: Diagnosis not present

## 2017-11-01 DIAGNOSIS — M810 Age-related osteoporosis without current pathological fracture: Secondary | ICD-10-CM | POA: Insufficient documentation

## 2017-11-01 DIAGNOSIS — R270 Ataxia, unspecified: Secondary | ICD-10-CM | POA: Insufficient documentation

## 2017-11-01 HISTORY — DX: Personal history of irradiation: Z92.3

## 2017-11-01 MED ORDER — PENTOXIFYLLINE ER 400 MG PO TBCR: 400.0000 mg | EXTENDED_RELEASE_TABLET | Freq: Two times a day (BID) | ORAL | 5 refills | Status: DC

## 2017-11-01 NOTE — Telephone Encounter (Signed)
Spoke with the patients son Ronalee Belts and let him know that his moms prescription has been sent to the [facility].  I also let him know that his mom is to continue taking vitamin E as well as the new prescription until she returns to see Dr. Mickeal Skinner in 2 months and continue the decadron until she hears further from either Dr. Mickeal Skinner or PA Dara Lords.  He verbalized understanding.  Will continue to follow as necessary.  Cori Razor, RN

## 2017-11-01 NOTE — Progress Notes (Signed)
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  Interval Since Last Radiation: 16 months 07/21/16:  PTV1 sup ver 25 mm/ 18 Gy in 1 fraction  Narrative:  The patient returns today for re-evaluation after undergoing SRS to the postop resection cavity at the superior vermis in 06/2016. Her history has been somewhat complicated as she was encouraged to proceed with whole brain radiation when her small cell lung cancer was initially diagnosed, however she has adamantly declined on multiple occasions since that time due to overwhelming concerns with cognitive decline if she were to go this route. She had repeat imaging later than the recommended interval due to her interest in avoiding as much imaging at that time. She's subsequently been treated for one lesion of the brain with SRS, but does understand the rationale to consider whole brain radiotherapy if her disease progresses.  She has agreed to surveillance MRIs and has remained compliant with follow up. She does have concerns around the frequency of these MRI scans and the contrast being given. 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 She started Trental 421m po BID with Vitamin E 400 IU BID which she tolerated well but discontinued use of both on her own accord in 07/2017 despite recommendation to continue. MRI from 07/27/17 showed progression of previously treated lesion since 03/2017 with increased size and surrounding edema. [place] and Vitamin E with repeat scan in  3 months time but patient discontinued her meds.  She then presented back to the clinic 09/24/17 with complaints of imbalance and "feeling off".  She did not agree to a contrasted MRI but underwent and MRI without contrast on 09/26/17. This did reveal concern for progressive soft tissue density at the site of her previous disease with severe localized edema throughout the superior cerebellum. The study was limited by the lack of contrast. Recommendations were to consider whole brain radiotherapy or more conformal approach of radiotherapy to the progressive disease in the cerebellum. She was started on Decadron 485mpo BID on 09/27/17 which she has continued taking as prescribed.  At the time of follow up visit 10/04/17, the decision was to proceed with further evaluation with contrast MRI with perfusion to better guide treatment recommendations/planning.  She met with Dr. JeArnoldo Moralehortly thereafter to elicit a second opinion and he agreed with proceeding with contrast MRI.  Perfusion MRI was performed on 10/23/17 and she presents today to review these results and further treatment recommendations.  This scan shows a 16 mm central mass-like component, stable from 09/26/17, with a focus of increased blood volume making it likely viable tumor.  There was surrounding non-mass-like enhancement (3 cm) with increased hemosidirin and decreased blood volume, likely necrosis.  Additionally, there was a 6 mm enhancement in the ependyma of the thalamus, previously punctate and concerning for a new metastatic deposit versus CSF dissemination.       On review of systems, the patient reports she feels as though her "brain is in a fog and something is not clear". She also reports difficulty with leg  and feet coordination making it difficult to walk. She reports being unable to walk more than 10-15 minutes due to leg pain/aching in the pelvis and LEs. She reports that the pain/weakness began within 1 week after starting steroids. She notes  feeling off balance this morning and unable to "control" her legs requiring use of a cane for ambulation. She is concerned of tripping. She reports poor sleep secondary to steroids which she has continued taking BID as prescribed. She denies seizure activity, headaches, nausea, focal numbness/weakness, visual deficits/changes, or confusion/memory deficits.   Past Medical History:  Past Medical History:  Diagnosis Date  . Adenomatous polyp 02/2009 &02/2012  . Anxiety   . Baker's cyst    L KNEE  . Bulging lumbar disc 1990  . CAD (coronary artery disease) 2007   PCI of RCA, bare metal stent.....Dr. Radford Pax  . Cancer (HCC)    lung mass  . Disc disorder of lumbar region    2 BULDGING DISCS DX 1990 BY DR. DALLDORF  . Dizzy   . Hx of radiation therapy 2018   [facility]  . Hyperlipidemia   . Hypertension    Takes Novasc ,Clonidine, & Losartan  . Lipoma of back   . Menopausal flushing   . Multiple thyroid nodules   . No pertinent past medical history    lung mass  occas cough   . Osteoporosis   . Positive test for herpes simplex virus (HSV) antibody   . Sinusitis, chronic   . Spondylosis    NECK  . Thyroid disease    nodules,stable on u/s 2/12  . Vitamin D deficiency     Past Surgical History: Past Surgical History:  Procedure Laterality Date  . CARPAL TUNNEL RELEASE  06/26/08        04/20/09   RIGHT HAND          LEFT HAND  . CORONARY ANGIOPLASTY     dr h Tamala Julian [phone] with one stent  . CRANIOTOMY Right 06/09/2015   Procedure: CRANIOTOMY TUMOR EXCISION WITH APPLICATION OF CRANIAL NAVIGATION;  Surgeon: Newman Pies, MD;  Location: [facility];  Service: Neurosurgery;  Laterality: Right;  Right temporal craniotomy for resection of tumor  . EYE SURGERY  01/07/09   LEFT CATARACT  . EYE SURGERY  01/21/09   RIGHT CATARACT  . FINGER CONTRACTURE RELEASE  8/12    Dr. Amedeo Plenty   right hand trigger finger release  . MEDIASTINOSCOPY  08/02/2011   Procedure: MEDIASTINOSCOPY;  Surgeon:  Nicanor Alcon, MD;  Location: [facility];  Service: Thoracic;  Laterality: N/A;  . right power port placement Right     Social History:  Social History   Socioeconomic History  . Marital status: Widowed    Spouse name: Not on file  . Number of children: 2  . Years of education: Not on file  . Highest education level: Not on file  Occupational History  . Occupation: Retired  Scientific laboratory technician  . Financial resource strain: Not on file  . Food insecurity:    Worry: Not on file    Inability: Not on file  . Transportation needs:    Medical: Not on file    Non-medical: Not on file  Tobacco Use  . Smoking status: Former Smoker    Packs/day: 0.50    Years: 10.00    Pack years: 5.00    Types: Cigarettes    Last attempt to quit: 07/12/2005    Years since quitting: 12.3  . Smokeless  tobacco: Never Used  Substance and Sexual Activity  . Alcohol use: No  . Drug use: No  . Sexual activity: Not on file  Lifestyle  . Physical activity:    Days per week: Not on file    Minutes per session: Not on file  . Stress: Not on file  Relationships  . Social connections:    Talks on phone: Not on file    Gets together: Not on file    Attends religious service: Not on file    Active member of club or organization: Not on file    Attends meetings of clubs or organizations: Not on file    Relationship status: Not on file  . Intimate partner violence:    Fear of current or ex partner: Not on file    Emotionally abused: Not on file    Physically abused: Not on file    Forced sexual activity: Not on file  Other Topics Concern  . Not on file  Social History Narrative  . Not on file    Family History: Family History  Problem Relation Age of Onset  . Healthy Son   . Healthy Son   . Anesthesia problems Neg Hx   . Hypotension Neg Hx   . Malignant hyperthermia Neg Hx   . Pseudochol deficiency Neg Hx      ALLERGIES:  is allergic to food; lipitor [atorvastatin calcium]; tape; and pantoprazole  sodium.  Meds: Current Outpatient Medications  Medication Sig Dispense Refill  . amLODipine (NORVASC) 10 MG tablet Take 10 mg by mouth every evening.     Marland Kitchen aspirin 81 MG tablet Take 81 mg by mouth daily.    . Cholecalciferol (VITAMIN D) 2000 units CAPS Take by mouth.    . dexamethasone (DECADRON) 4 MG tablet Take 1 tablet (4 mg total) by mouth 2 (two) times daily. 90 tablet 0  . hydrochlorothiazide (HYDRODIURIL) 25 MG tablet Take 25 mg by mouth daily.    Marland Kitchen ibuprofen (ADVIL,MOTRIN) 200 MG tablet Take 200 mg by mouth every 6 (six) hours as needed.    . labetalol (NORMODYNE) 100 MG tablet Take 100 mg by mouth daily.    . metoprolol succinate (TOPROL-XL) 100 MG 24 hr tablet Take 100 mg by mouth daily. Take with or immediately following a meal.    . MOMETASONE FUROATE EX Apply topically. 3.2% OINTMENT 1 APPLICATION TO AFFECTED AREA EXTERNALLY ONCE A DAY    . olmesartan-hydrochlorothiazide (BENICAR HCT) 40-25 MG tablet Take 1 tablet by mouth daily.    . Omega-3 Fatty Acids (FISH OIL PO) Take 4,000 Units by mouth 2 (two) times daily.    Marland Kitchen rOPINIRole (REQUIP) 1 MG tablet Take 1 mg by mouth at bedtime.    . rosuvastatin (CRESTOR) 10 MG tablet Take 1 tablet (10 mg total) by mouth 2 (two) times a week. 15 tablet 3  . triamcinolone cream (KENALOG) 0.1 % Apply 1 application topically 3 (three) times daily.     Marland Kitchen trimethoprim-polymyxin b (POLYTRIM) ophthalmic solution Place 1 drop into both eyes every 6 (six) hours. 10 mL 0  . vitamin E (VITAMIN E) 400 UNIT capsule Take 1 capsule (400 Units total) by mouth 2 (two) times daily. 60 capsule 6  . valsartan (DIOVAN) 320 MG tablet      No current facility-administered medications for this encounter.     Physical Findings:  height is 5' 4" (1.626 m) and weight is 190 lb (86.2 kg). Her temperature is 97.7 F (36.5 C).  Her blood pressure is 178/73 (abnormal) and her pulse is 53 (abnormal). Her respiration is 18.  Pain Assessment Pain Score: (joint, knees,  muscle pains, hip down.)/10 In general this is a well appearing [place] European Female in no acute distress. She's alert and oriented x4 and appropriate throughout the examination. Cardiopulmonary assessment is negative for acute distress and she exhibits normal effort. She appears grossly neurologically intact with intact EOMs bilaterally and PERRLA.  She has full ROM of the upper and lower extremities with 5/5 strength equal bilaterally and sensation intact to light touch.    Lab Findings: Lab Results  Component Value Date   WBC 5.2 12/28/2015   HGB 13.9 12/28/2015   HCT 42.2 12/28/2015   MCV 91.1 12/28/2015   PLT 155 12/28/2015     Radiographic Findings: Mr Jeri Cos YO Contrast  Result Date: 10/24/2017 CLINICAL DATA:  83 y/o F; SRS protocol, metastatic disease, necrosis versus active tumor. EXAM: MRI HEAD WITHOUT AND WITH CONTRAST TECHNIQUE: Multiplanar, multiecho pulse sequences of the brain and surrounding structures were obtained without and with intravenous contrast. CONTRAST:  55m MULTIHANCE GADOBENATE DIMEGLUMINE 529 MG/ML IV SOLN COMPARISON:  09/26/2017 MRI head.  04/26/2017 MRI head. FINDINGS: Brain: Abnormal enhancement within the superior vermis measures 30 x 24 x 29 mm (AP by ML by CC series 12, image 48 and series 13, image 18) which is increased in size in comparison with the prior contrast enhanced MR from 04/26/2017. There is a rim of low T2 signal with susceptibility blooming surrounding a central masslike nodule likely representing hemosiderin deposition from hemorrhage. The masslike T1 hypointense and T2 intermediate signal central portion measures 16 x 16 mm (series 9, image 51 in series 4, image 11) which is stable from the 09/26/2017 MRI and increased from the 04/26/2017 MRI. Additionally, there is a focus of increased relative cerebral blood volume within the superior aspect of the masslike enhancement (series 1053, image 9). There is decreased relative cerebral blood  volume within the surrounding non masslike enhancement and within the inferior aspect of the masslike enhancement. Stable resection cavity within the right lateral temporal lobe. Stable linear enhancement within the cavity margin without evidence for increased cerebral blood volume on perfusion compatible with scarring. 6 mm focus of enhancement along the ependyma of right thalamus (series 12 image 93) with reduced diffusion (series 350, image 31), punctate and slowly growing on prior MRI of the brain diffusion-weighted sequences. Extensive T2 FLAIR hyperintense signal abnormality throughout the vermis and bilateral cerebellar hemispheres, stable from prior MRI, compatible with edema. Stable patchy foci of T2 FLAIR hyperintensity throughout bilateral cerebral white matter which may represent chronic microvascular ischemic changes and/or posttreatment changes. No evidence for acute stroke, hemorrhage, hydrocephalus, or herniation. Vascular: Normal flow voids. Skull and upper cervical spine: Chronic postsurgical changes related to a right temporal craniotomy. Sinuses/Orbits: Negative. Other: Bilateral intra-ocular lens replacement. IMPRESSION: 1. Superior vermis abnormal enhancement is increased in size from 04/26/2017. There is a central masslike component measuring 16 mm stable from 09/26/2017 with a focus of increased cerebral blood volume indicating probable viable tumor. Surrounding the central masslike component there is ill-defined non masslike enhancement measuring up to 30 mm with hemosiderin deposition and decreased relative cerebral blood volume compatible with necrosis. 2. 6 mm focus of enhancement along the ependyma of right thalamus, previously punctate on prior MRI diffusion weighted sequences. This may represent new metastasis or CSF dissemination. Attention at follow-up recommended. 3. Stable edema within the vermis and bilateral cerebellar hemispheres. 4.  Stable extensive white matter disease  compatible with chronic microvascular ischemic changes and/or posttreatment changes. 5. Stable resection cavity in right temporal lobe and scarring at the cavity margin. Electronically Signed   By: Kristine Garbe M.D.   On: 10/24/2017 02:26    Impression/Plan: 1. Extensive stage small cell carcinoma of the lung with brain metastasis.  The patient has done well despite not receiving whole brain radiotherapy, however there is evidence of local failure at the prior treatment site. Dr. Lisbeth Renshaw discusses that the recent perfusion MRI scan confirms likely residual viable tumor at previously treated resection site with surrounding radionecrosis but appears stable as compared to her MRI without contrast on 09/26/17. There is residual edema as well as a new, 6 mm enhancement in the ependyma of the thalamus, previously punctate.  At present, this focus is indeterminate but concerning for a new metastatic deposit versus CSF dissemination with recommendation for close follow up.  That being said, Dr. Lisbeth Renshaw recommends that we continue to closely monitor the previously treated lesion and new area of enhancement with repeat MRI brain with contrast in 2 months.  In the interim, she will resume Trental 400 mg po BID along with Vitamin E 400 IU BID.  Based on the residual edema on her recent scan as well as her persistent symptoms with imbalance and weakness in the lower extremities, we recommended that she continue taking Decadron 4 mg p.o. twice daily.  We anticipate gradually tapering her off of the steroids over the next 2 to 3 weeks.  We have recommended that she be evaluated with Dr. Mickeal Skinner to further assess her progressive symptoms of imbalance and weakness in the lower extremities.  She is in agreement with this plan and will be seen by Dr. Mickeal Skinner following our appointment today.  She recently met with Dr. Arnoldo Morale who did not recommend further surgical resection.  However, she requested to be scheduled for a  second opinion with Dr. Robin Searing in the neurosurgery department at [facility] and will be seen on 11/08/17 for a second opinion regarding consideration for surgical resection.  She will have a repeat perfusion MRI brain scan in 2 months and follow up visit with Dr. Mickeal Skinner thereafter to review results and further treatment recommendations at that time.  In a visit lasting 45 minutes, greater than 50% of the time was spent face to face discussing her case, and coordinating the patient's care.  The above documentation reflects my direct findings during this shared patient visit. Please see the separate note by Dr. Lisbeth Renshaw on this date for the remainder of the patient's plan of care.     Nicholos Johns, PA-C ------------------------------------------------  Jodelle Gross, MD, PhD   This document serves as a record of services personally performed by Kyung Rudd, MD and Freeman Caldron, PA-C. It was created on their behalf by Bethann Humble, a trained medical scribe. The creation of this record is based on the scribe's personal observations and the provider's statements to them. This document has been checked and approved by the attending provider.

## 2017-11-05 ENCOUNTER — Other Ambulatory Visit: Payer: Self-pay | Admitting: *Deleted

## 2017-11-05 ENCOUNTER — Ambulatory Visit: Payer: Self-pay | Admitting: Radiation Oncology

## 2017-11-05 ENCOUNTER — Encounter: Payer: Self-pay | Admitting: *Deleted

## 2017-11-05 NOTE — Progress Notes (Signed)
[place] Work  [employer] representative received phone call from patients son, Ronalee Belts expressing the need for additional care at home.  CSW and Ronalee Belts discussed home health and home care options.  CSW provided education on home health, and patients son requested home health assessment.  CSW consulted with RN who will place the home health order/referral.  CSW provided contact information and encouraged patient/family to call with questions or concerns.  Johnnye Lana, MSW, LCSW, OSW-C Clinical Social Worker [facility] [phone]

## 2017-11-06 ENCOUNTER — Ambulatory Visit: Payer: Medicare Other | Admitting: Internal Medicine

## 2017-11-14 ENCOUNTER — Telehealth: Payer: Self-pay

## 2017-11-14 ENCOUNTER — Encounter: Payer: Self-pay | Admitting: *Deleted

## 2017-11-14 NOTE — Telephone Encounter (Signed)
Received call from pt requesting appt with MD to evaluate mobility for a electric wheelchair. Per Dr. Benay Spice pt can come tomorrow at 2pm. Pt son voiced understanding and agreeable with appt.

## 2017-11-14 NOTE — Progress Notes (Signed)
1300 Left a message on voicemail for Margaret Hale to discuss her need for an electric wheelchair with and her situation with her doctor at [facility].  [facility] doesn't write rx's for wheelchairs/scooters.  If you are not having surgery or laser interstitial thermal therapy, then you would need to discuss further with Dr. Mickeal Skinner. Call 336 [phone] ask for [facility] RN for Margaret Hale, [place].

## 2017-11-15 ENCOUNTER — Inpatient Hospital Stay (HOSPITAL_BASED_OUTPATIENT_CLINIC_OR_DEPARTMENT_OTHER): Payer: Medicare Other | Admitting: Oncology

## 2017-11-15 ENCOUNTER — Telehealth: Payer: Self-pay | Admitting: Radiation Oncology

## 2017-11-15 ENCOUNTER — Telehealth: Payer: Self-pay | Admitting: Oncology

## 2017-11-15 ENCOUNTER — Encounter: Payer: Self-pay | Admitting: General Practice

## 2017-11-15 ENCOUNTER — Other Ambulatory Visit: Payer: Self-pay | Admitting: Radiation Oncology

## 2017-11-15 VITALS — BP 167/84 | HR 55 | Temp 98.6°F | Resp 17 | Ht 64.0 in

## 2017-11-15 DIAGNOSIS — C7949 Secondary malignant neoplasm of other parts of nervous system: Secondary | ICD-10-CM

## 2017-11-15 DIAGNOSIS — R61 Generalized hyperhidrosis: Secondary | ICD-10-CM | POA: Diagnosis not present

## 2017-11-15 DIAGNOSIS — C3491 Malignant neoplasm of unspecified part of right bronchus or lung: Secondary | ICD-10-CM

## 2017-11-15 DIAGNOSIS — R609 Edema, unspecified: Secondary | ICD-10-CM | POA: Diagnosis not present

## 2017-11-15 DIAGNOSIS — I10 Essential (primary) hypertension: Secondary | ICD-10-CM

## 2017-11-15 DIAGNOSIS — C7931 Secondary malignant neoplasm of brain: Secondary | ICD-10-CM | POA: Diagnosis not present

## 2017-11-15 DIAGNOSIS — R270 Ataxia, unspecified: Secondary | ICD-10-CM | POA: Diagnosis not present

## 2017-11-15 NOTE — Addendum Note (Signed)
Addended by: Mathis Fare on: 11/15/2017 03:55 PM   Modules accepted: Orders

## 2017-11-15 NOTE — Progress Notes (Signed)
[place] CSW Progress Note  Call from RN, patient and son would like to speak w CSW re need for increased support in home due to patient's increasing weakness.  Has now begun to use wheelchair, increasingly weak.  Met w son and patient in exam room.  Recommended in home PT evaluation for safety, equipment and treatment needs.  Referred patient and son to [employer] and [facility] for guidance on appropriate community resources.  Provided list of assisted living facilities in [place] - although patient prefers to remain at home, she may be open to placement at some point.  Does not currently qualify for Medicaid therefore placement would be private pay at this point.  Gave son and patient my contact information - encouraged them to call as needed for additional support.  Edwyna Shell, LCSW Clinical Social Worker Phone:  [phone]

## 2017-11-15 NOTE — Telephone Encounter (Signed)
I had a 30 minute phone call with Margaret Hale's son Margaret Hale and we reviewed the timeline of her diagnosis of brain disease from her prior resection with Dr. Arnoldo Morale to the time of finding the vermean lesion and subsequent treatment in February 2018 with SRS as a nonstandard approach to treating her small cell carcinoma as she refused whole brain radiotherapy on multiple occasions. Unfortunately due in part to her disinterest in treatment as well as radionecrosis like changes, she's had significant clinical decline. She's seeing Dr. Benay Spice today and we discussed options as well for palliative care in the home. I've sent in a referral for this to begin through the [facility]

## 2017-11-15 NOTE — Telephone Encounter (Signed)
-----   Message from Mallie Darting sent at 11/14/2017  9:46 AM EDT ----- Regarding: 2nd Call from Ms. Orellana Patient left 2nd voice message state no one has called her. Asked that someone call her from our office and speak to her son Ronalee Belts.  I called spoke with Ronalee Belts he confirmed that patient do not want to get radiation with our office. They would like to speak with [facility] or Dr. Lisbeth Renshaw regarding patient radiation treatment that the patient received. I did say and probably should not have that if is she is being followed by a medical doctor then usually that is who will prescribe the RX for an electrical chair. Sorry over steps...  Melanie Nunnally [phone]

## 2017-11-15 NOTE — Telephone Encounter (Signed)
Scheduled appt per 6/20 los  - pt aware of appt date and time.

## 2017-11-15 NOTE — Progress Notes (Signed)
[place] OFFICE PROGRESS NOTE   Diagnosis: Small cell carcinoma  INTERVAL HISTORY:    Margaret Hale returns after missing a scheduled visit.  Brain MRI on 10/23/2017 revealed an increase in the size of abnormal enhancement at the superior vermis with a central masslike component.  A 6 mm focus of enhancement is noted at the ependyma of the right thalamus.  There was stable edema within the vermis and bilateral cerebellar hemispheres.  She has been evaluated by radiation oncology and neurosurgery.  This is included a consultation with neurosurgery at [facility].  She is scheduled for a biopsy at [facility] next week.  It is unclear whether the enhancing mass at the cerebellum is related to tumor progression or radiation necrosis.  Dr. Salomon Fick is considering a LITT procedure  She reports increased difficulty with ambulation over the past few weeks.  She can only ambulate when holding onto the wall and counters.  Her legs are weak.  No fall.  She is here today with her son.  Good appetite.  She continues to have "sweats ".  No dyspnea or pain.  Objective:  Vital signs in last 24 hours:  Blood pressure (!) 167/84, pulse (!) 55, temperature 98.6 F (37 C), temperature source Oral, resp. rate 17, height 5\' 4"  (1.626 m), SpO2 99 %.    HEENT: No thrush Lymphatics: No cervical or supraclavicular nodes Resp: Lungs clear bilaterally Cardio: Regular rate and rhythm GI: No hepatosplenomegaly, nontender Vascular: No leg edema Neuro: There are and oriented, mild difficulty with finger-to-nose testing, the motor exam appears intact in the upper and lower extremities bilaterally, slight weakness with proximal flexion at the hips, she was able to ambulate to the examination table with assistance.  The gait is unsteady   Medications: I have reviewed the patient's current medications.   Assessment/Plan: 1. Extensive stage small cell lung cancer  resection of right temporal brain  mass 06/09/2015 confirmed metastatic small cell carcinoma  Staging PET scan at [facility] 07/22/2015 revealed decreased activity associated with previously seen pulmonary nodules , decreased activity of hypermetabolic mediastinal and bilateral hilar nodes , no new sites of FDG avid metastatic disease compared to a [facility] PET scan from November 2016  Cycle 1 etoposide/carboplatin 08/16/2015  Cycle 2 etoposide/carboplatin 09/14/2015  Cycle 3 etoposide/carboplatin 10/19/2015  Restaging chest CT 11/12/2015 with generally stable appearance of solid, sub-solid and ground glass pulmonary nodules bilaterally. Stable mildly prominent mediastinal lymph nodes. No progressive adenopathy or new findings.  Cycle 4 etoposide/carboplatin 11/16/2015   Brain MRI 01/24/2016-new 4 mm superior vermis metastasis  CT chest 06/07/2016-stable solid/sub-solid pulmonary nodules, stable mediastinal lymph nodes, no evidence of progressive disease  MRI brain 07/05/2016-progression of the cerebellar metastasis with associated edema and effacement of the upper fourth ventricle   SRS to cerebellarlesion 07/21/2016  MRI brain 10/20/2016-decrease in size and mass effect associated with the cerebellar lesion consistent with posttreatment change  MRI brain 01/22/2017-increased size, enhancement, and edema at the vermis metastasis, started onpentoxifyilline  Chest CT 02/14/2017-stable scattered solid for a nodules, stable small B-cell lymph nodes, improvement in sub-solid lesions in the bilateral upper lobes  Brain MRI 04/26/2017- improvement in the treated vermis metastasis, no new lesions  Brain MRI 07/27/2017-increased size in edema associated with cerebellar lesion, observation recommended  CT chest 08/03/2017- stable bilateral lung nodules, no evidence for disease progression  MRI 09/26/2017- enlargement of the cerebellar vermis lesion with progressive bilateral cerebellar edema  MRI brain 10/24/2017- increased size  of superior vermis  abnormal enhancement, stable central masslike component, stable cerebellar edema, 6 mm enhancement at the ependyma of the right thalamus  2. Chronic history of mediastinal lymphadenopathy and pulmonary nodules-diagnostic workup in 2013 including bronchoscopy/EBUS , and mediastinoscopy -negative for malignancy, , granulomatous changes noted  3. Hypertension  4. History of CAD  5. "Sweats "-most likely related to post menopausal hot flashes  6. Port-A-Cath placement 10/26/2015  7.    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 has extensive stage small cell lung cancer.  She is symptomatic with progression of edema in the cerebellum.  It is unclear whether the cerebellar process is related to radionecrosis or aggressive tumor.  She is maintained on Decadron.  She has developed severe ataxia.  She is at risk for a fall and it is becoming increasingly difficult for her to live at home.  I discussed skilled nursing facility placement with Ms. Weisse and her son.  We will refer them to the Cancer center social worker.  They have been referred to home palliative care.  We will arrange for a home OT/PT evaluation to recommend equipment needs and assess for home safety.  She was given a handicap parking sticker today.  I asked her to consult further with neurosurgery regarding the likelihood of an neurosurgical procedure helping her symptoms.  She will return for an office visit here in 1 month.  I am available to see her sooner as needed.  25 minutes were spent with the patient today.  The majority of the time was used for counseling and coordination of care.  Betsy Coder, MD  11/15/2017  2:25 PM

## 2017-11-22 ENCOUNTER — Telehealth: Payer: Self-pay

## 2017-11-22 NOTE — Telephone Encounter (Signed)
Spoke with son, Ronalee Belts, to schedule visit with Palliative Care. Available times of visit provided. Ronalee Belts to speak with his mother and contact Palliative care with requested time of visit.

## 2017-11-28 ENCOUNTER — Telehealth: Payer: Self-pay | Admitting: Cardiology

## 2017-11-28 NOTE — Telephone Encounter (Signed)
Given the urgency of surgery and her overall stable coronary artery disease, she may proceed with moderate cardiovascular risk.  No further cardiac testing warranted. Candee Furbish, MD

## 2017-11-28 NOTE — Telephone Encounter (Signed)
Will have Dr Marlou Porch review to see if pt needs to be seen prior to giving surgical clearance

## 2017-11-28 NOTE — Telephone Encounter (Signed)
Sent directly to vm with message that VM has not been set up yet.

## 2017-11-28 NOTE — Telephone Encounter (Signed)
New Message    Sam a NP at [facility] is calling in reference to this patient. She states that this patient is scheduled to have brain surgery next week on the 12th. She indicates that this patient needs to be seen right away. As well as having new onset chest pains. So this should be scheduled urgently. Please call.

## 2017-11-28 NOTE — Telephone Encounter (Signed)
Attempted to contact Margaret Hale at phone number listed.  Received message stating voicemail has not been set up yet.  Will continue to attempt to contact to discuss recommendations.

## 2017-12-12 ENCOUNTER — Inpatient Hospital Stay: Payer: Medicare Other | Attending: Oncology | Admitting: Nurse Practitioner

## 2017-12-12 ENCOUNTER — Telehealth: Payer: Self-pay | Admitting: Nurse Practitioner

## 2017-12-12 ENCOUNTER — Encounter: Payer: Self-pay | Admitting: Nurse Practitioner

## 2017-12-12 VITALS — BP 159/73 | HR 52 | Temp 98.2°F | Resp 17 | Ht 64.0 in

## 2017-12-12 DIAGNOSIS — C3491 Malignant neoplasm of unspecified part of right bronchus or lung: Secondary | ICD-10-CM | POA: Insufficient documentation

## 2017-12-12 DIAGNOSIS — R270 Ataxia, unspecified: Secondary | ICD-10-CM | POA: Insufficient documentation

## 2017-12-12 DIAGNOSIS — R531 Weakness: Secondary | ICD-10-CM | POA: Diagnosis not present

## 2017-12-12 DIAGNOSIS — I10 Essential (primary) hypertension: Secondary | ICD-10-CM | POA: Diagnosis not present

## 2017-12-12 DIAGNOSIS — K1379 Other lesions of oral mucosa: Secondary | ICD-10-CM | POA: Insufficient documentation

## 2017-12-12 DIAGNOSIS — R61 Generalized hyperhidrosis: Secondary | ICD-10-CM

## 2017-12-12 DIAGNOSIS — C7931 Secondary malignant neoplasm of brain: Secondary | ICD-10-CM | POA: Insufficient documentation

## 2017-12-12 DIAGNOSIS — Z95828 Presence of other vascular implants and grafts: Secondary | ICD-10-CM

## 2017-12-12 MED ORDER — HEPARIN SOD (PORK) LOCK FLUSH 100 UNIT/ML IV SOLN
500.0000 [IU] | Freq: Once | INTRAVENOUS | Status: AC
  Administered 2017-12-12: 500 [IU] via INTRAVENOUS
  Filled 2017-12-12: qty 5

## 2017-12-12 MED ORDER — MAGIC MOUTHWASH: 10.0000 mL | Freq: Four times a day (QID) | ORAL | 0 refills | Status: DC | PRN

## 2017-12-12 MED ORDER — SODIUM CHLORIDE 0.9% FLUSH
10.0000 mL | Freq: Once | INTRAVENOUS | Status: AC
  Administered 2017-12-12: 10 mL via INTRAVENOUS
  Filled 2017-12-12: qty 10

## 2017-12-12 NOTE — Telephone Encounter (Signed)
Have been unable to reach Sam by phone.  Will await a return phone call from him.

## 2017-12-12 NOTE — Progress Notes (Addendum)
[place] OFFICE PROGRESS NOTE   Diagnosis: Small cell carcinoma  INTERVAL HISTORY:   Margaret Hale returns as scheduled.  She underwent stereotactic brain biopsy, insertion of hyperthermia catheter and MRI thermometry guided ablation of brain tumor using cool directional LITT on 12/06/2017.  The biopsy showed metastatic small cell carcinoma.  She reports continued ataxia and leg weakness.  She is up with a walker.  No nausea.  She has a few mouth sores.  Bowels moving.  She reports a good appetite.  She denies weight loss.  She has sweats periodically.  Objective:  Vital signs in last 24 hours:  Blood pressure (!) 159/73, pulse (!) 52, temperature 98.2 F (36.8 C), temperature source Oral, resp. rate 17, height 5\' 4"  (1.626 m), SpO2 98 %.    HEENT: Small ulceration right buccal mucosa. Resp: Lungs clear bilaterally. Cardio: Regular rate and rhythm. GI: Abdomen soft and nontender.  No hepatosplenomegaly. Vascular: No leg edema. Neuro: Alert and oriented.  Upper and lower extremity motor strength appears intact. Port-A-Cath without erythema.   Lab Results:  Lab Results  Component Value Date   WBC 5.2 12/28/2015   HGB 13.9 12/28/2015   HCT 42.2 12/28/2015   MCV 91.1 12/28/2015   PLT 155 12/28/2015   NEUTROABS 3.1 12/28/2015    Imaging:  No results found.  Medications: I have reviewed the patient's current medications.  Assessment/Plan: 1. Extensive stage small cell lung cancer  resection of right temporal brain mass 06/09/2015 confirmed metastatic small cell carcinoma  Staging PET scan at [facility] 07/22/2015 revealed decreased activity associated with previously seen pulmonary nodules , decreased activity of hypermetabolic mediastinal and bilateral hilar nodes , no new sites of FDG avid metastatic disease compared to a [facility] PET scan from November 2016  Cycle 1 etoposide/carboplatin 08/16/2015  Cycle 2 etoposide/carboplatin 09/14/2015  Cycle 3  etoposide/carboplatin 10/19/2015  Restaging chest CT 11/12/2015 with generally stable appearance of solid, sub-solid and ground glass pulmonary nodules bilaterally. Stable mildly prominent mediastinal lymph nodes. No progressive adenopathy or new findings.  Cycle 4 etoposide/carboplatin 11/16/2015   Brain MRI 01/24/2016-new 4 mm superior vermis metastasis  CT chest 06/07/2016-stable solid/sub-solid pulmonary nodules, stable mediastinal lymph nodes, no evidence of progressive disease  MRI brain 07/05/2016-progression of the cerebellar metastasis with associated edema and effacement of the upper fourth ventricle   SRS to cerebellarlesion 07/21/2016  MRI brain 10/20/2016-decrease in size and mass effect associated with the cerebellar lesion consistent with posttreatment change  MRI brain 01/22/2017-increased size, enhancement, and edema at the vermis metastasis, started onpentoxifyilline  Chest CT 02/14/2017-stable scattered solid for a nodules, stable small B-cell lymph nodes, improvement in sub-solid lesions in the bilateral upper lobes  Brain MRI 04/26/2017-improvement in the treated vermis metastasis, no new lesions  Brain MRI 07/27/2017-increased size in edema associated with cerebellar lesion, observation recommended  CT chest 08/03/2017- stable bilateral lung nodules, no evidence for disease progression  MRI 09/26/2017- enlargement of the cerebellar vermis lesion with progressive bilateral cerebellar edema  MRI brain 10/24/2017- increased size of superior vermis abnormal enhancement, stable central masslike component, stable cerebellar edema, 6 mm enhancement at the ependyma of the right thalamus  stereotactic brain biopsy, insertion of hyperthermia catheter and MRI thermometry guided ablation of brain tumor using cool directional LITT on 12/06/2017.  Biopsy showed metastatic small cell carcinoma  2. Chronic history of mediastinal lymphadenopathy and pulmonary nodules-diagnostic  workup in 2013 including bronchoscopy/EBUS , and mediastinoscopy -negative for malignancy, granulomatous changes noted  3. Hypertension  4.  History of CAD  5. "Sweats "-most likely related to post menopausal hot flashes  6. Port-A-Cath placement 10/26/2015  7.    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  She continues to have leg weakness and ataxia.  She is scheduled to return to [facility] next week for follow-up.  Dr. Benay Hale recommends a restaging CT scan of the chest in approximately 1 month.  She will return for a follow-up visit to review the results/discuss options to include possible treatment versus a hospice referral.  Of note, she declines a referral for home physical therapy/Occupational Therapy.  Patient seen with Dr. Benay Hale.    Margaret Hale ANP/GNP-BC   12/12/2017  2:25 PM  This was a shared visit with Margaret Hale.  Margaret Hale was interviewed and examined.  We reviewed records from [facility].  We discussed the diagnosis of progressive small cell carcinoma and treatment options.  She has not experienced improvement in neurologic symptoms following the LITT procedure.  We discussed hospice care.  She does not wish to enroll in hospice care.  She will undergo a restaging chest CT to look for evidence of systemic disease progression.  We will consider treatment with temozolomide if she has measurable disease in the CNS or elsewhere on restaging studies.  She will follow-up with Dr.Tatter and in radiation oncology at [facility] for repeat CNS imaging.  Margaret Manson, MD

## 2017-12-12 NOTE — Telephone Encounter (Signed)
Scheduled appt per 7/17 los - unable reach patient - vmail full - reminder letter sent in the mail .

## 2017-12-13 ENCOUNTER — Other Ambulatory Visit: Payer: Self-pay | Admitting: Radiation Therapy

## 2017-12-13 ENCOUNTER — Encounter: Payer: Self-pay | Admitting: Radiation Therapy

## 2017-12-13 DIAGNOSIS — C7931 Secondary malignant neoplasm of brain: Secondary | ICD-10-CM

## 2017-12-13 DIAGNOSIS — C7949 Secondary malignant neoplasm of other parts of nervous system: Principal | ICD-10-CM

## 2017-12-13 NOTE — Progress Notes (Signed)
Appointment reminder sent out in the mail on 7/18 for her upcoming brain MRI and visit with Dr. Mickeal Skinner.   Mont Dutton R.T.(R)(T) [facility]   Radiation Oncology  Radiation Special Procedures Navigator Office: [phone]   Pager: [phone]  [place] Website: [email or web address]

## 2017-12-17 ENCOUNTER — Other Ambulatory Visit: Payer: Self-pay | Admitting: Cardiology

## 2017-12-18 ENCOUNTER — Other Ambulatory Visit: Payer: Self-pay | Admitting: Emergency Medicine

## 2017-12-18 ENCOUNTER — Other Ambulatory Visit: Payer: Self-pay | Admitting: Nurse Practitioner

## 2017-12-24 ENCOUNTER — Other Ambulatory Visit: Payer: Medicare Other

## 2017-12-26 ENCOUNTER — Ambulatory Visit: Payer: Medicare Other | Admitting: Internal Medicine

## 2017-12-28 ENCOUNTER — Other Ambulatory Visit: Payer: Self-pay | Admitting: Radiation Oncology

## 2017-12-28 ENCOUNTER — Ambulatory Visit
Admission: RE | Admit: 2017-12-28 | Discharge: 2017-12-28 | Disposition: A | Discharge status: Home / Self Care | Payer: Self-pay | Source: Ambulatory Visit | Attending: Radiation Oncology | Admitting: Radiation Oncology

## 2017-12-28 DIAGNOSIS — C7931 Secondary malignant neoplasm of brain: Secondary | ICD-10-CM

## 2017-12-29 ENCOUNTER — Other Ambulatory Visit: Payer: Self-pay | Admitting: Nurse Practitioner

## 2018-01-11 ENCOUNTER — Inpatient Hospital Stay: Payer: Medicare Other | Admitting: Oncology

## 2018-01-18 ENCOUNTER — Telehealth: Payer: Self-pay | Admitting: Oncology

## 2018-01-18 ENCOUNTER — Telehealth: Payer: Self-pay

## 2018-01-18 NOTE — Telephone Encounter (Signed)
Spoke with pt son regarding upcoming appts. CT scheduled at [facility] on 8/27 at 2:30pm to arrive at 2:15pm. Visit with Benay Spice on 8/28 at 1:45pm. Pt son voiced understanding and will relay message to pt and consult our office with any changes,

## 2018-01-18 NOTE — Telephone Encounter (Signed)
PT scheduled per 8/23 sch message. Pt aware of d&t.

## 2018-01-22 ENCOUNTER — Ambulatory Visit (HOSPITAL_COMMUNITY)
Admission: RE | Admit: 2018-01-22 | Discharge: 2018-01-22 | Disposition: A | Discharge status: Home / Self Care | Payer: Medicare Other | Source: Ambulatory Visit | Attending: Nurse Practitioner | Admitting: Nurse Practitioner

## 2018-01-22 DIAGNOSIS — C3491 Malignant neoplasm of unspecified part of right bronchus or lung: Secondary | ICD-10-CM | POA: Insufficient documentation

## 2018-01-22 DIAGNOSIS — I2510 Atherosclerotic heart disease of native coronary artery without angina pectoris: Secondary | ICD-10-CM | POA: Diagnosis not present

## 2018-01-22 DIAGNOSIS — I700 Atherosclerosis of aorta: Secondary | ICD-10-CM | POA: Diagnosis not present

## 2018-01-22 DIAGNOSIS — J439 Emphysema, unspecified: Secondary | ICD-10-CM | POA: Insufficient documentation

## 2018-01-23 ENCOUNTER — Other Ambulatory Visit: Payer: Self-pay

## 2018-01-23 ENCOUNTER — Inpatient Hospital Stay: Payer: Medicare Other | Attending: Oncology | Admitting: Oncology

## 2018-01-23 VITALS — BP 146/64 | HR 60 | Temp 98.5°F | Resp 18 | Ht 64.0 in

## 2018-01-23 DIAGNOSIS — I10 Essential (primary) hypertension: Secondary | ICD-10-CM | POA: Insufficient documentation

## 2018-01-23 DIAGNOSIS — I700 Atherosclerosis of aorta: Secondary | ICD-10-CM | POA: Insufficient documentation

## 2018-01-23 DIAGNOSIS — C3491 Malignant neoplasm of unspecified part of right bronchus or lung: Secondary | ICD-10-CM | POA: Diagnosis present

## 2018-01-23 DIAGNOSIS — C7931 Secondary malignant neoplasm of brain: Secondary | ICD-10-CM | POA: Insufficient documentation

## 2018-01-23 DIAGNOSIS — L299 Pruritus, unspecified: Secondary | ICD-10-CM | POA: Diagnosis not present

## 2018-01-23 DIAGNOSIS — E041 Nontoxic single thyroid nodule: Secondary | ICD-10-CM | POA: Diagnosis not present

## 2018-01-23 MED ORDER — METHYLPREDNISOLONE 4 MG PO TBPK: ORAL_TABLET | ORAL | 0 refills | Status: DC

## 2018-01-23 NOTE — Progress Notes (Signed)
[place] OFFICE PROGRESS NOTE   Diagnosis: Small cell carcinoma  INTERVAL HISTORY:   Margaret Hale reports improvement in her mobility impaired to when I saw her last month.  She is still living alone. An MRI brain at [facility] 01/10/2018 revealed no new enhancing lesion.  Interval increased enhancement surrounding the ablation zone in the superior vermis with no change in the nonspecific enhancing lesion in the right lateral ventricle body.  She underwent gamma knife treatment to the vermis [facility] on 01/10/2018.  She reports diffuse pruritus since undergoing the procedure.  No new medications.  No rash.  The pruritus is partially relieved with a moisturizer cream.  Antihistamines cause balance difficulty. She is scheduled for follow-up at [facility] in October.  Objective:  Vital signs in last 24 hours:  Blood pressure (!) 146/64, pulse 60, temperature 98.5 F (36.9 C), temperature source Oral, resp. rate 18, height 5\' 4"  (1.626 m), SpO2 97 %.    HEENT: No thrush, sclera anicteric Lymphatics: No cervical, supraclavicular, axillary, or inguinal nodes Resp: Lungs clear bilaterally Cardio: Regular rate and rhythm GI: No hepatomegaly nontender, no mass Vascular: No leg edema Neuro: Alert and oriented, ambulated to the examination table without difficulty, the motor exam appears intact in the upper and lower extremities.  Finger-to-nose testing is normal. Skin: Multiple excoriations from scratching over the trunk and extremities.,?  A single hive at the left flank area    Lab Results:  Lab Results  Component Value Date   WBC 5.2 12/28/2015   HGB 13.9 12/28/2015   HCT 42.2 12/28/2015   MCV 91.1 12/28/2015   PLT 155 12/28/2015   NEUTROABS 3.1 12/28/2015    CMP  Lab Results  Component Value Date   NA 141 06/08/2016   K 4.1 06/08/2016   CL 104 06/07/2015   CO2 29 06/08/2016   GLUCOSE 120 06/08/2016   BUN 24.7 06/08/2016   CREATININE 0.84 10/23/2017    CALCIUM 9.3 06/08/2016   PROT 7.1 12/28/2015   ALBUMIN 3.9 12/28/2015   AST 20 12/28/2015   ALT 22 07/24/2017   ALKPHOS 110 12/28/2015   BILITOT 0.44 12/28/2015   GFRNONAA >60 10/23/2017   GFRAA >60 10/23/2017    No results found for: CEA1  Lab Results  Component Value Date   INR 0.94 10/26/2015    Imaging:  Ct Chest Wo Contrast  Result Date: 01/23/2018 CLINICAL DATA:  Followup non-small cell lung cancer EXAM: CT CHEST WITHOUT CONTRAST TECHNIQUE: Multidetector CT imaging of the chest was performed following the standard protocol without IV contrast. COMPARISON:  08/02/2017 FINDINGS: Cardiovascular: The heart size appears normal. Aortic atherosclerosis identified. Calcifications in the RCA, LAD and left circumflex coronary arteries noted. Mediastinum/Nodes: Low-attenuation nodule arising from inferior pole of right lobe of thyroid gland is again noted measuring 1.7 cm. The trachea appears patent and is midline. Normal appearance of the esophagus. No enlarged axillary lymph nodes. Stable index right paratracheal lymph node measuring 1.1 cm, image 55/2. The index subcarinal lymph node measures 1 point 1 cm, image 65/2. Also unchanged. Lungs/Pleura: Mild changes of emphysema. No pleural effusion, airspace consolidation or atelectasis. Scattered small pulmonary nodules are again identified. The dominant nodule within the right middle lobe measures 1 cm and is unchanged compared with previous exam. The other smaller nodules are stable measuring up to 4 mm ( image 64/7, 75/7, 24/7, 42/7, 54/7.). Unchanged. Upper Abdomen: No acute abnormality.  Aortic atherosclerosis. Musculoskeletal: Spondylosis identified within the thoracic spine. No  aggressive lytic or sclerotic bone lesions identified. IMPRESSION: 1. Stable bilateral pulmonary nodules, including dominant 10 mm nodule in right middle lobe. 2. Aortic Atherosclerosis (ICD10-I70.0) and Emphysema (ICD10-J43.9). 3. Multi vessel coronary artery  atherosclerotic calcifications. Electronically Signed   By: Kerby Moors M.D.   On: 01/23/2018 11:43    Medications: I have reviewed the patient's current medications.   Assessment/Plan: 1. Extensive stage small cell lung cancer  resection of right temporal brain mass 06/09/2015 confirmed metastatic small cell carcinoma  Staging PET scan at [facility] 07/22/2015 revealed decreased activity associated with previously seen pulmonary nodules , decreased activity of hypermetabolic mediastinal and bilateral hilar nodes , no new sites of FDG avid metastatic disease compared to a [facility] PET scan from November 2016  Cycle 1 etoposide/carboplatin 08/16/2015  Cycle 2 etoposide/carboplatin 09/14/2015  Cycle 3 etoposide/carboplatin 10/19/2015  Restaging chest CT 11/12/2015 with generally stable appearance of solid, sub-solid and ground glass pulmonary nodules bilaterally. Stable mildly prominent mediastinal lymph nodes. No progressive adenopathy or new findings.  Cycle 4 etoposide/carboplatin 11/16/2015   Brain MRI 01/24/2016-new 4 mm superior vermis metastasis  CT chest 06/07/2016-stable solid/sub-solid pulmonary nodules, stable mediastinal lymph nodes, no evidence of progressive disease  MRI brain 07/05/2016-progression of the cerebellar metastasis with associated edema and effacement of the upper fourth ventricle   SRS to cerebellarlesion 07/21/2016  MRI brain 10/20/2016-decrease in size and mass effect associated with the cerebellar lesion consistent with posttreatment change  MRI brain 01/22/2017-increased size, enhancement, and edema at the vermis metastasis, started onpentoxifyilline  Chest CT 02/14/2017-stable scattered solid for a nodules, stable small B-cell lymph nodes, improvement in sub-solid lesions in the bilateral upper lobes  Brain MRI 04/26/2017-improvement in the treated vermis metastasis, no new lesions  Brain MRI 07/27/2017-increased size in edema associated with  cerebellar lesion, observation recommended  CT chest 08/03/2017-stable bilateral lung nodules, no evidence for disease progression  MRI 09/26/2017- enlargement of the cerebellar vermis lesion with progressive bilateral cerebellar edema  MRI brain 10/24/2017- increased size of superior vermis abnormal enhancement, stable central masslike component, stable cerebellar edema, 6 mm enhancement at the ependyma of the right thalamus  stereotactic brain biopsy, insertion of hyperthermia catheter and MRI thermometry guided ablation of brain tumor using cool directional LITT on 12/06/2017.  Biopsy showed metastatic small cell carcinoma  MRI brain at [facility] 01/10/2018- new enhancing lesion, increased enhancement surrounding ablation zone in the superior vermis, no change in nonspecific enhancing lesion in the right lateral jugular body  Gamma knife treatment vermis lesion at [facility] on 01/10/2018  CT chest 01/23/2018-no change in small mediastinal nodes and lung nodules  2. Chronic history of mediastinal lymphadenopathy and pulmonary nodules-diagnostic workup in 2013 including bronchoscopy/EBUS , and mediastinoscopy -negative for malignancy, granulomatous changes noted  3. Hypertension  4. History of CAD  5. "Sweats "-most likely related to post menopausal hot flashes  6. 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 Burling has an improved performance status compared to when I saw her last month.  She underwent gamma knife treatment to the vermis on 01/10/2018.  There is no evidence of systemic disease progression by history, exam, or chest CT today.  She will follow-up at [facility] for repeat CNS imaging in October.  I will see her after the [facility] appointment.  We will consider temozolomide treatment if she develops progressive disease in the CNS.  The etiology of the  pruritus is unclear.  No new medications.  She may have a single hive at the left flank on exam today.  She states that she cannot tolerate antihistamines.  We will prescribe a short prednisone taper.  She will continue a moisturizer as needed.    Betsy Coder, MD  01/23/2018  2:13 PM

## 2018-01-24 ENCOUNTER — Telehealth: Payer: Self-pay | Admitting: Oncology

## 2018-01-24 NOTE — Telephone Encounter (Signed)
Scheduled appt per 8/28 los - sent reminder letter in the mail with appt date and time.

## 2018-02-20 ENCOUNTER — Telehealth: Payer: Self-pay

## 2018-02-20 NOTE — Telephone Encounter (Signed)
Message sent to Dr. Benay Spice to provide update that patient's son declined palliative services at this time.

## 2018-02-20 NOTE — Telephone Encounter (Signed)
Phone call placed to son, Ronalee Belts, to check in to see if he would like to schedule with Palliative Care. Son shared that patient is doing ok right now and does not feel she needs to pursue Palliative Care. Plan is for patient and son to contact Dr. Benay Spice if feels palliative care is needed in the future.

## 2018-03-14 ENCOUNTER — Encounter: Payer: Self-pay | Admitting: Oncology

## 2018-03-14 ENCOUNTER — Inpatient Hospital Stay: Payer: Medicare Other | Attending: Oncology | Admitting: Oncology

## 2018-03-14 ENCOUNTER — Telehealth: Payer: Self-pay | Admitting: Oncology

## 2018-03-14 VITALS — BP 124/58 | HR 56 | Temp 97.8°F | Resp 18 | Ht 64.0 in

## 2018-03-14 DIAGNOSIS — C3491 Malignant neoplasm of unspecified part of right bronchus or lung: Secondary | ICD-10-CM | POA: Insufficient documentation

## 2018-03-14 DIAGNOSIS — C7931 Secondary malignant neoplasm of brain: Secondary | ICD-10-CM | POA: Insufficient documentation

## 2018-03-14 DIAGNOSIS — L299 Pruritus, unspecified: Secondary | ICD-10-CM | POA: Insufficient documentation

## 2018-03-14 DIAGNOSIS — I10 Essential (primary) hypertension: Secondary | ICD-10-CM | POA: Diagnosis not present

## 2018-03-14 NOTE — Progress Notes (Signed)
[place] OFFICE PROGRESS NOTE   Diagnosis: Small cell carcinoma  INTERVAL HISTORY:   Ms.Oviatt returns as scheduled.  She underwent an MRI at [facility] on 03/12/2018.  Decreased enhancement was noted at the ablation zone in the vermis consistent with evolving treatment change.  Decreased mass-effect on the fourth ventricle.  Gradual increase in size over multiple studies have a lesion in the body of the right lateral ventricle concerning for metastasis.  No new enhancing lesion. She was evaluated at [facility] and reports a repeat MRI will be scheduled for 2 months. She feels better than she did several months ago.  She continues to have intermittent pruritus.  The pruritus did not resolve after the steroid Dosepak.  She continues to have balance difficulty, but is ambulatory with a walker. Objective:  Vital signs in last 24 hours:  Blood pressure (!) 124/58, pulse (!) 56, temperature 97.8 F (36.6 C), temperature source Oral, resp. rate 18, height 5\' 4"  (1.626 m), SpO2 97 %.    HEENT: Neck without mass Lymphatics: No cervical, supraclavicular, or axillary nodes Resp: Lungs clear bilaterally Cardio: Regular rate and rhythm GI: No hepatomegaly, no mass, nontender Vascular: No leg edema Neuro: Alert and oriented, the motor exam appears intact in the upper and lower extremities.  She has mild difficulty with finger-to-nose testing.  She is able to ambulate to the examination table with assistance    Medications: I have reviewed the patient's current medications.   Assessment/Plan: . Extensive stage small cell lung cancer  resection of right temporal brain mass 06/09/2015 confirmed metastatic small cell carcinoma  Staging PET scan at [facility] 07/22/2015 revealed decreased activity associated with previously seen pulmonary nodules , decreased activity of hypermetabolic mediastinal and bilateral hilar nodes , no new sites of FDG avid metastatic disease  compared to a [facility] PET scan from November 2016  Cycle 1 etoposide/carboplatin 08/16/2015  Cycle 2 etoposide/carboplatin 09/14/2015  Cycle 3 etoposide/carboplatin 10/19/2015  Restaging chest CT 11/12/2015 with generally stable appearance of solid, sub-solid and ground glass pulmonary nodules bilaterally. Stable mildly prominent mediastinal lymph nodes. No progressive adenopathy or new findings.  Cycle 4 etoposide/carboplatin 11/16/2015   Brain MRI 01/24/2016-new 4 mm superior vermis metastasis  CT chest 06/07/2016-stable solid/sub-solid pulmonary nodules, stable mediastinal lymph nodes, no evidence of progressive disease  MRI brain 07/05/2016-progression of the cerebellar metastasis with associated edema and effacement of the upper fourth ventricle   SRS to cerebellarlesion 07/21/2016  MRI brain 10/20/2016-decrease in size and mass effect associated with the cerebellar lesion consistent with posttreatment change  MRI brain 01/22/2017-increased size, enhancement, and edema at the vermis metastasis, started onpentoxifyilline  Chest CT 02/14/2017-stable scattered solid for a nodules, stable small B-cell lymph nodes, improvement in sub-solid lesions in the bilateral upper lobes  Brain MRI 04/26/2017-improvement in the treated vermis metastasis, no new lesions  Brain MRI 07/27/2017-increased size in edema associated with cerebellar lesion, observation recommended  CT chest 08/03/2017-stable bilateral lung nodules, no evidence for disease progression  MRI 09/26/2017-enlargement of the cerebellar vermis lesion with progressive bilateral cerebellar edema  MRI brain 10/24/2017-increased size of superior vermis abnormal enhancement, stable central masslike component, stable cerebellar edema, 6 mm enhancement at the ependyma of the right thalamus  stereotactic brain biopsy, insertion of hyperthermia catheter and MRI thermometry guided ablation of brain tumor using cool directional LITT on  12/06/2017.Biopsy showed metastatic small cell carcinoma  MRI brain at [facility] 01/10/2018- new enhancing lesion, increased enhancement surrounding ablation zone in the superior vermis,  no change in nonspecific enhancing lesion in the right lateral jugular body  Gamma knife treatment vermis lesion at [facility] on 01/10/2018  CT chest 01/23/2018-no change in small mediastinal nodes and lung nodules  MRI brain at [facility] 03/12/2018-decreased enhancement at the ablation zone in the vermis with decreased mass-effect on the fourth ventricle, increase in size of an enhancing lesion in the body of the right lateral ventricle (8 mm), no new enhancing lesion  2. Chronic history of mediastinal lymphadenopathy and pulmonary nodules-diagnostic workup in 2013 including bronchoscopy/EBUS , and mediastinoscopy -negative for malignancy, granulomatous changes noted  3. Hypertension  4. History of CAD  5. "Sweats "-most likely related to post menopausal hot flashes  6. 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 Prouse has an improved performance status over the past few months.  There is no clinical evidence of systemic disease progression.  The restaging MRI of the brain reveals gradual enlargement of a right ventricular body lesion, but no other evidence of disease progression and decreased enhancement at the vermis.  She is scheduled for follow-up and a repeat MRI at [facility] in December.  She will return for an office visit here in 4 months.  She will contact us in the interim for new symptoms.  15 minutes were spent with the patient today.  The majority of the time was used for counseling and coordination of care.  Betsy Coder, MD  03/14/2018  12:54 PM

## 2018-03-14 NOTE — Telephone Encounter (Signed)
Scheduled appt per 10/17 los - unable to reach patient v- mail full - reminder letter in the mail.

## 2018-07-09 ENCOUNTER — Telehealth: Payer: Self-pay | Admitting: *Deleted

## 2018-07-09 NOTE — Telephone Encounter (Signed)
Son called back to report patient is becoming hostile, angry, saying mean things to family. Speech seems more slurred now. Has MRI brain at [facility] on 2/24.  Instructed him to call her MD at [facility] with these new symptoms to have scan sooner--most likely could be progression of disease in her brain. He agrees to do so.

## 2018-07-09 NOTE — Telephone Encounter (Signed)
Son called to request assistance from Dr. Benay Spice. Returned call and left VM to call back.

## 2018-07-15 ENCOUNTER — Encounter: Payer: Self-pay | Admitting: *Deleted

## 2018-07-15 ENCOUNTER — Inpatient Hospital Stay: Payer: Medicare Other

## 2018-07-15 ENCOUNTER — Ambulatory Visit (HOSPITAL_COMMUNITY)
Admission: RE | Admit: 2018-07-15 | Discharge: 2018-07-15 | Disposition: A | Discharge status: Home / Self Care | Payer: Medicare Other | Source: Ambulatory Visit | Attending: Oncology | Admitting: Oncology

## 2018-07-15 ENCOUNTER — Other Ambulatory Visit: Payer: Self-pay | Admitting: *Deleted

## 2018-07-15 ENCOUNTER — Inpatient Hospital Stay: Payer: Medicare Other | Attending: Oncology | Admitting: Oncology

## 2018-07-15 ENCOUNTER — Telehealth: Payer: Self-pay | Admitting: Oncology

## 2018-07-15 VITALS — BP 139/60 | HR 56 | Temp 98.2°F | Resp 19 | Ht 64.0 in | Wt 182.0 lb

## 2018-07-15 DIAGNOSIS — Z95828 Presence of other vascular implants and grafts: Secondary | ICD-10-CM

## 2018-07-15 DIAGNOSIS — R260 Ataxic gait: Secondary | ICD-10-CM | POA: Insufficient documentation

## 2018-07-15 DIAGNOSIS — C7931 Secondary malignant neoplasm of brain: Secondary | ICD-10-CM | POA: Diagnosis not present

## 2018-07-15 DIAGNOSIS — I10 Essential (primary) hypertension: Secondary | ICD-10-CM | POA: Insufficient documentation

## 2018-07-15 DIAGNOSIS — C3491 Malignant neoplasm of unspecified part of right bronchus or lung: Secondary | ICD-10-CM | POA: Insufficient documentation

## 2018-07-15 MED ORDER — HEPARIN SOD (PORK) LOCK FLUSH 100 UNIT/ML IV SOLN: 500.0000 [IU] | Freq: Once | INTRAVENOUS | Status: DC

## 2018-07-15 MED ORDER — SODIUM CHLORIDE 0.9% FLUSH
10.0000 mL | Freq: Once | INTRAVENOUS | Status: AC
  Administered 2018-07-15: 10 mL
  Filled 2018-07-15: qty 10

## 2018-07-15 MED ORDER — HEPARIN SOD (PORK) LOCK FLUSH 100 UNIT/ML IV SOLN
INTRAVENOUS | Status: AC
  Administered 2018-07-15: 500 [IU] via INTRAVENOUS
  Filled 2018-07-15: qty 5

## 2018-07-15 NOTE — Progress Notes (Signed)
Patient is aware of her memory deficits and her decline in ability to care for herself. High fall risk with significant balance issues. Son brings her food and groceries and cooks at times. Pharmacy delivers her meds. She lives alone and admits she needs someone to come to her home to provide care or even placement. Asking for list of agencies and facilities. Contacted CSW department for assistance.

## 2018-07-15 NOTE — Telephone Encounter (Signed)
Scheduled appt per 2/17 los.  Printed avs.

## 2018-07-15 NOTE — Progress Notes (Signed)
Son, Margaret Hale,  presented at clinic alone prior to his mother's appointment to give detailed account of patient's recent hostile behavior which started a month ago. He states that she is trying to get him to divorce his wife and is trying to get his wife fired from her job. She recently made a list of every gift she had given his 82 year old daughter and asked her son to return them all to her. He is afraid to set limits because she has 'thrown everyone else out of her life" and he is all she has. He provided multiple examples of his mothers  behavior that is alarming and out of character. Information provided to Dr. Benay Spice.

## 2018-07-15 NOTE — Progress Notes (Addendum)
[place] OFFICE PROGRESS NOTE   Diagnosis: Metastatic small cell carcinoma  INTERVAL HISTORY:   Margaret Hale returns as scheduled.  She is here with her son.  She complains of increased ataxia.  This has worsened over the past few months.  She is concerned about falling.  She continues to live alone.  She is here today with her son.  He reports she has had several emotional outburst toward him and other family members.  He feels she can no longer live alone.  No confusion.  No pain.  Objective:  Vital signs in last 24 hours:  Blood pressure 139/60, pulse (!) 56, temperature 98.2 F (36.8 C), temperature source Oral, resp. rate 19, height 5\' 4"  (1.626 m), weight 182 lb (82.6 kg), SpO2 97 %.    HEENT: Neck without mass Lymphatics: No cervical, supraclavicular, or axillary nodes Resp: Lungs clear bilaterally Cardio: Regular rate and rhythm GI: No hepatosplenomegaly Vascular: Leg edema Neuro: Alert, oriented to place and date.  Follows commands.  The motor exam appears intact in the upper and lower extremities bilaterally.  Finger-to-nose testing is normal.  She walks with an unsteady gait.  She needs to hold onto the exam room counter in order to hop from falling  Lab Results:  Lab Results  Component Value Date   WBC 5.2 12/28/2015   HGB 13.9 12/28/2015   HCT 42.2 12/28/2015   MCV 91.1 12/28/2015   PLT 155 12/28/2015   NEUTROABS 3.1 12/28/2015    CMP  Lab Results  Component Value Date   NA 141 06/08/2016   K 4.1 06/08/2016   CL 104 06/07/2015   CO2 29 06/08/2016   GLUCOSE 120 06/08/2016   BUN 24.7 06/08/2016   CREATININE 0.84 10/23/2017   CALCIUM 9.3 06/08/2016   PROT 7.1 12/28/2015   ALBUMIN 3.9 12/28/2015   AST 20 12/28/2015   ALT 22 07/24/2017   ALKPHOS 110 12/28/2015   BILITOT 0.44 12/28/2015   GFRNONAA >60 10/23/2017   GFRAA >60 10/23/2017     Medications: I have reviewed the patient's current medications.   Assessment/Plan: 1.   Extensive stage small cell lung cancer  resection of right temporal brain mass 06/09/2015 confirmed metastatic small cell carcinoma  Staging PET scan at [facility] 07/22/2015 revealed decreased activity associated with previously seen pulmonary nodules , decreased activity of hypermetabolic mediastinal and bilateral hilar nodes , no new sites of FDG avid metastatic disease compared to a [facility] PET scan from November 2016  Cycle 1 etoposide/carboplatin 08/16/2015  Cycle 2 etoposide/carboplatin 09/14/2015  Cycle 3 etoposide/carboplatin 10/19/2015  Restaging chest CT 11/12/2015 with generally stable appearance of solid, sub-solid and ground glass pulmonary nodules bilaterally. Stable mildly prominent mediastinal lymph nodes. No progressive adenopathy or new findings.  Cycle 4 etoposide/carboplatin 11/16/2015   Brain MRI 01/24/2016-new 4 mm superior vermis metastasis  CT chest 06/07/2016-stable solid/sub-solid pulmonary nodules, stable mediastinal lymph nodes, no evidence of progressive disease  MRI brain 07/05/2016-progression of the cerebellar metastasis with associated edema and effacement of the upper fourth ventricle   SRS to cerebellarlesion 07/21/2016  MRI brain 10/20/2016-decrease in size and mass effect associated with the cerebellar lesion consistent with posttreatment change  MRI brain 01/22/2017-increased size, enhancement, and edema at the vermis metastasis, started onpentoxifyilline  Chest CT 02/14/2017-stable scattered solid for a nodules, stable small B-cell lymph nodes, improvement in sub-solid lesions in the bilateral upper lobes  Brain MRI 04/26/2017-improvement in the treated vermis metastasis, no new lesions  Brain MRI 07/27/2017-increased size  in edema associated with cerebellar lesion, observation recommended  CT chest 08/03/2017-stable bilateral lung nodules, no evidence for disease progression  MRI 09/26/2017-enlargement of the cerebellar vermis lesion with  progressive bilateral cerebellar edema  MRI brain 10/24/2017-increased size of superior vermis abnormal enhancement, stable central masslike component, stable cerebellar edema, 6 mm enhancement at the ependyma of the right thalamus  stereotactic brain biopsy, insertion of hyperthermia catheter and MRI thermometry guided ablation of brain tumor using cool directional LITT on 12/06/2017.Biopsy showed metastatic small cell carcinoma  MRI brain at [facility] 01/10/2018- new enhancing lesion, increased enhancement surrounding ablation zone in the superior vermis, no change in nonspecific enhancing lesion in the right lateral jugular body  Gamma knife treatment vermis lesion at [facility] on 01/10/2018  CT chest 01/23/2018-no change in small mediastinal nodes and lung nodules  MRI brain at [facility] 03/12/2018-decreased enhancement at the ablation zone in the vermis with decreased mass-effect on the fourth ventricle, increase in size of an enhancing lesion in the body of the right lateral ventricle (8 mm), no new enhancing lesion  2. Chronic history of mediastinal lymphadenopathy and pulmonary nodules-diagnostic workup in 2013 including bronchoscopy/EBUS , and mediastinoscopy -negative for malignancy, granulomatous changes noted  3. Hypertension  4. History of CAD  5. "Sweats "-most likely related to post menopausal hot flashes  6. 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  It is becoming increasingly difficult for her to live at home.  She and her son agree to skilled nursing facility placement.  We will request a social work evaluation today.  [facility], OT, nurse, and social work evaluation.  The emotional lability is likely situational.  She is scheduled for a repeat brain MRI at [facility]  next week.  She will return for an office visit here on 07/31/2018.  There is no clinical evidence for progression of the small cell carcinoma.  She request a restaging chest CT today.  25 minutes were spent with the patient today.  The majority of the time was used for counseling and coordination of care.    Betsy Coder, MD  07/15/2018  12:46 PM

## 2018-07-15 NOTE — Progress Notes (Signed)
CSW, Margaret Hale spoke with patient and her son today. Will refer to [place] to assess home needs/services.

## 2018-07-15 NOTE — Progress Notes (Signed)
[place] Work  [employer] representative received referral from Therapist, sports for home care needs.  CSW met with patient and patients son in the exam room to offer support and assess for needs.  Patient and son reported and decrease in ADL's and increase risk of falls.  Patients lives alone and son checks on her daily, but is unable to provide the personal care needs.  CSW, patient, and patients son discussed home health vs home care.  Patient understands that home care is typically an out of pocket cost.  CSW also provided education on SNF vs ALF, and that ALF would be an out of pocket cost.  Patient was agreeable to a home health assessment referral.  CSW also provided patient and patients son with information on [facility] and [facility] navigator.  Patient and son plan to call [facility] to assist in identified appropriate resources and services available.  CSW provided contact information and encouraged patient and family to call with questions or concerns.    Johnnye Lana, MSW, LCSW, OSW-C Clinical Social Worker [facility] [phone]

## 2018-07-16 ENCOUNTER — Encounter: Payer: Self-pay | Admitting: *Deleted

## 2018-07-16 NOTE — Progress Notes (Signed)
[place] to follow up on receipt of referral order placed on 07/15/18. It has not been received. Printed off referral and faxed to [phone] with note to contact son for admission visit appointment.

## 2018-07-17 ENCOUNTER — Telehealth: Payer: Self-pay | Admitting: *Deleted

## 2018-07-17 NOTE — Telephone Encounter (Signed)
Notified patient and son, Ronalee Belts of CT results. Patient has requested a copy of report be put in mail for her and this was done as requested. Son reports they have not heard from [place] [facility] yet. Called [facility]: Spoke to [place] to follow up on receipt of referral that was faxed yesterday. Was informed the referral was denied due to staffing. Need to select another agency. Sent message to CSW to advise which agency to try next.

## 2018-07-17 NOTE — Telephone Encounter (Signed)
-----   Message from Ladell Pier, MD sent at 07/16/2018  6:17 PM EST ----- Please call patient, 1 lung nodule is slightly larger, otherwise no evidence of cancer, the nodule is small, plan to follow for now, will review images at next visit

## 2018-07-18 ENCOUNTER — Encounter: Payer: Self-pay | Admitting: *Deleted

## 2018-07-18 NOTE — Progress Notes (Signed)
Faxed referral order, office note and demographic information to [facility] for admission to services since Margaret Hale is not able to admit patient. Left message to contact son, Margaret Hale to arrange admission visit.

## 2018-07-19 ENCOUNTER — Encounter: Payer: Self-pay | Admitting: *Deleted

## 2018-07-19 NOTE — Progress Notes (Signed)
Received fax that Margaret Hale has declined patient to services due to "management team is currently full at this time".  Called son, Ronalee Belts and he agrees to allow nurse to refer to [place]. Faxed referral and chart info to [place].

## 2018-07-22 ENCOUNTER — Telehealth: Payer: Self-pay | Admitting: *Deleted

## 2018-07-22 NOTE — Telephone Encounter (Signed)
Call from [facility] asking if Dr. Benay Spice is willing to be the attending and sign/follow her home health orders. Left message that Dr. Benay Spice will be attending for her home health orders/care.

## 2018-07-26 ENCOUNTER — Telehealth: Payer: Self-pay | Admitting: Oncology

## 2018-07-26 NOTE — Telephone Encounter (Signed)
/  s appt per 2/28 sch message - pt son is aware of appt change

## 2018-07-30 ENCOUNTER — Telehealth: Payer: Self-pay | Admitting: *Deleted

## 2018-07-30 NOTE — Telephone Encounter (Signed)
Call from [facility] with physical therapy at [facility]. Requesting verbal order for PT to see patient 2/week for 5 weeks. Also needs a manual lightweight wheelchair with elevated leg rests. Please fax order to [facility] at [phone] and they will forward the order to equipment supplier. Provided OK to PT order and w/c order faxed as requested.

## 2018-07-31 ENCOUNTER — Other Ambulatory Visit: Payer: Self-pay | Admitting: *Deleted

## 2018-07-31 ENCOUNTER — Telehealth: Payer: Self-pay | Admitting: Oncology

## 2018-07-31 ENCOUNTER — Inpatient Hospital Stay: Payer: Medicare Other | Attending: Oncology | Admitting: Oncology

## 2018-07-31 ENCOUNTER — Inpatient Hospital Stay: Payer: Medicare Other

## 2018-07-31 ENCOUNTER — Other Ambulatory Visit: Payer: Self-pay

## 2018-07-31 VITALS — BP 153/69 | HR 57 | Temp 98.0°F | Resp 19 | Ht 64.0 in

## 2018-07-31 DIAGNOSIS — R260 Ataxic gait: Secondary | ICD-10-CM | POA: Insufficient documentation

## 2018-07-31 DIAGNOSIS — C7931 Secondary malignant neoplasm of brain: Secondary | ICD-10-CM | POA: Diagnosis not present

## 2018-07-31 DIAGNOSIS — C3491 Malignant neoplasm of unspecified part of right bronchus or lung: Secondary | ICD-10-CM

## 2018-07-31 DIAGNOSIS — E876 Hypokalemia: Secondary | ICD-10-CM | POA: Diagnosis not present

## 2018-07-31 DIAGNOSIS — I10 Essential (primary) hypertension: Secondary | ICD-10-CM | POA: Diagnosis not present

## 2018-07-31 LAB — BASIC METABOLIC PANEL - CANCER CENTER ONLY
Anion gap: 11 (ref 5–15)
BUN: 20 mg/dL (ref 8–23)
CO2: 27 mmol/L (ref 22–32)
Calcium: 8.8 mg/dL — ABNORMAL LOW (ref 8.9–10.3)
Chloride: 103 mmol/L (ref 98–111)
Creatinine: 0.71 mg/dL (ref 0.44–1.00)
GFR, Estimated: >60 mL/min (ref >60)
Glucose, Bld: 108 mg/dL — ABNORMAL HIGH (ref 70–99)
Potassium: 3.2 mmol/L — ABNORMAL LOW (ref 3.5–5.1)
Sodium: 141 mmol/L (ref 135–145)

## 2018-07-31 MED ORDER — VALSARTAN 320 MG PO TABS: 320.0000 mg | ORAL_TABLET | Freq: Every day | ORAL | 1 refills | Status: DC

## 2018-07-31 MED ORDER — POTASSIUM CHLORIDE ER 10 MEQ PO TBCR: 10.0000 meq | EXTENDED_RELEASE_TABLET | Freq: Every day | ORAL | 3 refills | Status: DC

## 2018-07-31 MED ORDER — HEPARIN SOD (PORK) LOCK FLUSH 100 UNIT/ML IV SOLN
500.0000 [IU] | Freq: Once | INTRAVENOUS | Status: AC
  Administered 2018-07-31: 500 [IU] via INTRAVENOUS
  Filled 2018-07-31: qty 5

## 2018-07-31 MED ORDER — SODIUM CHLORIDE 0.9% FLUSH
10.0000 mL | INTRAVENOUS | Status: DC | PRN
  Administered 2018-07-31: 10 mL via INTRAVENOUS
  Filled 2018-07-31: qty 10

## 2018-07-31 NOTE — Progress Notes (Signed)
[facility] OFFICE PROGRESS NOTE   Diagnosis: Small cell lung cancer  INTERVAL HISTORY:   Margaret Hale is as scheduled.  She is here with her son.  She continues to live alone.  She complains of persistent balance difficulty.  She is being evaluated by home physical therapy and Occupational Therapy.  She is scheduled to meet with social work today. She underwent a restaging brain MRI at [facility] on 07/26/2018.  There is no progression of the treated metastatic lesions at the superior cerebellar vermis and right temporal lobe with no evidence of enhancement. Similar size and enhancement of an ependymal lesion at the body of the right lateral ventricle.  No new enhancing lesion.  He saw Dr. Vallarie Mare.  A 73-month brain MRI is recommended. A CT of the chest on 07/15/2018 revealed progression of a spiculated nodule in the right upper lobe and a new 3 mm right middle lobe nodule.  Other nodules are unchanged. Objective:  Vital signs in last 24 hours:  Blood pressure (!) 153/69, pulse (!) 57, temperature 98 F (36.7 C), temperature source Oral, resp. rate 19, height 5\' 4"  (1.626 m), SpO2 99 %.    Sickle exam-she declined physical examination today  Lab Results:   CMP  Lab Results  Component Value Date   NA 141 07/31/2018   K 3.2 (L) 07/31/2018   CL 103 07/31/2018   CO2 27 07/31/2018   GLUCOSE 108 (H) 07/31/2018   BUN 20 07/31/2018   CREATININE 0.71 07/31/2018   CALCIUM 8.8 (L) 07/31/2018   PROT 7.1 12/28/2015   ALBUMIN 3.9 12/28/2015   AST 20 12/28/2015   ALT 22 07/24/2017   ALKPHOS 110 12/28/2015   BILITOT 0.44 12/28/2015   GFRNONAA >60 07/31/2018   GFRAA >60 07/31/2018    Medications: I have reviewed the patient's current medications.   Assessment/Plan: 1. Extensive stage small cell lung cancer  resection of right temporal brain mass 06/09/2015 confirmed metastatic small cell carcinoma  Staging PET scan at [facility] 07/22/2015 revealed decreased activity  associated with previously seen pulmonary nodules , decreased activity of hypermetabolic mediastinal and bilateral hilar nodes , no new sites of FDG avid metastatic disease compared to a [facility] PET scan from November 2016  Cycle 1 etoposide/carboplatin 08/16/2015  Cycle 2 etoposide/carboplatin 09/14/2015  Cycle 3 etoposide/carboplatin 10/19/2015  Restaging chest CT 11/12/2015 with generally stable appearance of solid, sub-solid and ground glass pulmonary nodules bilaterally. Stable mildly prominent mediastinal lymph nodes. No progressive adenopathy or new findings.  Cycle 4 etoposide/carboplatin 11/16/2015   Brain MRI 01/24/2016-new 4 mm superior vermis metastasis  CT chest 06/07/2016-stable solid/sub-solid pulmonary nodules, stable mediastinal lymph nodes, no evidence of progressive disease  MRI brain 07/05/2016-progression of the cerebellar metastasis with associated edema and effacement of the upper fourth ventricle   SRS to cerebellarlesion 07/21/2016  MRI brain 10/20/2016-decrease in size and mass effect associated with the cerebellar lesion consistent with posttreatment change  MRI brain 01/22/2017-increased size, enhancement, and edema at the vermis metastasis, started onpentoxifyilline  Chest CT 02/14/2017-stable scattered solid for a nodules, stable small B-cell lymph nodes, improvement in sub-solid lesions in the bilateral upper lobes  Brain MRI 04/26/2017-improvement in the treated vermis metastasis, no new lesions  Brain MRI 07/27/2017-increased size in edema associated with cerebellar lesion, observation recommended  CT chest 08/03/2017-stable bilateral lung nodules, no evidence for disease progression  MRI 09/26/2017-enlargement of the cerebellar vermis lesion with progressive bilateral cerebellar edema  MRI brain 10/24/2017-increased size of superior vermis abnormal enhancement,  stable central masslike component, stable cerebellar edema, 6 mm enhancement at the  ependyma of the right thalamus  stereotactic brain biopsy, insertion of hyperthermia catheter and MRI thermometry guided ablation of brain tumor using cool directional LITT on 12/06/2017.Biopsy showed metastatic small cell carcinoma  MRI brain at [facility] 01/10/2018- new enhancing lesion, increased enhancement surrounding ablation zone in the superior vermis, no change in nonspecific enhancing lesion in the right lateral jugular body  Gamma knife treatment vermis lesion at [facility] on 01/10/2018  CT chest 01/23/2018-no change in small mediastinal nodes and lung nodules  MRI brain at [facility] 03/12/2018-decreased enhancement at the ablation zone in the vermis with decreased mass-effect on the fourth ventricle, increase in size of an enhancing lesion in the body of the right lateral ventricle (8 mm), no new enhancing lesion  MRI brain at [facility] 07/25/2018- stable treated lesions at the superior cerebellar vermis and right temporal lobe, similar size and enhancement of ependymal lesion at the body of the right lateral ventricle  CT chest 07/15/2018-increased in size of spiculated right upper lobe lesion, new 3 mm right middle lobe lesion  2. Chronic history of mediastinal lymphadenopathy and pulmonary nodules-diagnostic workup in 2013 including bronchoscopy/EBUS , and mediastinoscopy -negative for malignancy, granulomatous changes noted  3. Hypertension  4. History of CAD  5. "Sweats "-most likely related to post menopausal hot flashes  6. 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  I reviewed the chest CT images with her.  There is enlargement of one right-sided lung lesion over the past 6 months.  She appears asymptomatic from the small cell carcinoma.  I suspect the balance abnormality is related to surgery and radiation.  There is no  evidence of progressive disease in the brain.  I recommend continued observation.  She will return for a follow-up chest CT at a 26-month interval.  She is scheduled for a follow-up brain MRI at [facility] in 2 months.  She will continue follow-up with home PT/OT.  She was referred to psychology at [facility].  She declined counseling services here.  She requested we refill her valsartan.  We obtained a chemistry panel and the potassium is mildly low.  I will recommend a potassium supplement.  She continues to receive IV vitamin C therapy via an alternative medicine clinic.  Betsy Coder, MD  07/31/2018  12:51 PM

## 2018-07-31 NOTE — Telephone Encounter (Signed)
Scheduled appt per 3/4 los - reminder letter sent in the mail with appt date and time

## 2018-07-31 NOTE — Progress Notes (Signed)
K+ level is low at 3.2 and creatinine is normal. Begin potassium 10 meq daily. Script sent to [facility] and son, [place] notified.

## 2018-08-05 ENCOUNTER — Telehealth: Payer: Self-pay | Admitting: *Deleted

## 2018-08-05 NOTE — Telephone Encounter (Signed)
Request approval for OT to see patient in home 2/week X 4 weeks. Per DR. Sherrill : OK

## 2018-08-09 ENCOUNTER — Telehealth: Payer: Self-pay

## 2018-08-09 NOTE — Telephone Encounter (Signed)
Phone call placed to patient to introduce Palliative Care and to offer to schedule a visit with NP. Visit scheduled for 08/13/2018

## 2018-08-13 ENCOUNTER — Other Ambulatory Visit: Payer: Medicare Other | Admitting: Internal Medicine

## 2018-08-13 ENCOUNTER — Telehealth: Payer: Self-pay | Admitting: *Deleted

## 2018-08-13 ENCOUNTER — Other Ambulatory Visit: Payer: Self-pay

## 2018-08-13 DIAGNOSIS — Z515 Encounter for palliative care: Secondary | ICD-10-CM

## 2018-08-13 NOTE — Telephone Encounter (Signed)
Called to request verbal order to approve one more CSW visit to investigate if her insurance will cover a "Life Alert" system and to review potential assisted living and SNF.  Gave OK for 2nd visit.

## 2018-08-13 NOTE — Progress Notes (Signed)
    [facility] Consult Note Telephone: [phone]  Fax: [phone]  PATIENT NAME: Margaret Hale DOB: 1934/07/09 MRN: [id number]  PRIMARY CARE PROVIDER:   [name], MD   REFERRING PROVIDER:  DR. Alen Blew  NOTE:  0930  Phoned pt's son/POA, Elly Modena, and confirmed today's appt along with patient COVID 19 screening which was negative.   1400  Arrived at patient's home for initial Palliative consult visit.  No response to knocks at door. This NP phoned patient at [phone] (listed home phone number) and spoke to patient in reference to today's scheduled appointment.  She stated that she was not aware of today's appointment and desired to reschedule it for a different day this week.  We agreed upon Friday  08/16/18 at [facility].   Son was notified of above occurrences.  He thanked me for the call.   Gonzella Lex, NP-C

## 2018-08-22 ENCOUNTER — Telehealth: Payer: Self-pay | Admitting: *Deleted

## 2018-08-22 NOTE — Telephone Encounter (Signed)
Call to follow up on wheelchair order on this patient. She was told by [facility] they were waiting for documentation from the office for the wheelchair and had not received the return fax. Informed her that we have not received any papers for w/c orders. Provided direct fax # and she will call [facility] to send the form/order request.

## 2018-09-06 ENCOUNTER — Other Ambulatory Visit: Payer: Self-pay

## 2018-09-06 ENCOUNTER — Emergency Department (HOSPITAL_COMMUNITY): Payer: Medicare Other

## 2018-09-06 ENCOUNTER — Emergency Department (HOSPITAL_COMMUNITY)
Admission: EM | Admit: 2018-09-06 | Discharge: 2018-09-06 | Disposition: A | Discharge status: Home / Self Care | Payer: Medicare Other | Attending: Emergency Medicine | Admitting: Emergency Medicine

## 2018-09-06 ENCOUNTER — Encounter (HOSPITAL_COMMUNITY): Payer: Self-pay

## 2018-09-06 DIAGNOSIS — E041 Nontoxic single thyroid nodule: Secondary | ICD-10-CM

## 2018-09-06 DIAGNOSIS — Y92129 Unspecified place in nursing home as the place of occurrence of the external cause: Secondary | ICD-10-CM | POA: Insufficient documentation

## 2018-09-06 DIAGNOSIS — S0990XA Unspecified injury of head, initial encounter: Secondary | ICD-10-CM | POA: Diagnosis not present

## 2018-09-06 DIAGNOSIS — S161XXA Strain of muscle, fascia and tendon at neck level, initial encounter: Secondary | ICD-10-CM | POA: Diagnosis not present

## 2018-09-06 DIAGNOSIS — Z87891 Personal history of nicotine dependence: Secondary | ICD-10-CM | POA: Diagnosis not present

## 2018-09-06 DIAGNOSIS — I2510 Atherosclerotic heart disease of native coronary artery without angina pectoris: Secondary | ICD-10-CM | POA: Insufficient documentation

## 2018-09-06 DIAGNOSIS — Y939 Activity, unspecified: Secondary | ICD-10-CM | POA: Diagnosis not present

## 2018-09-06 DIAGNOSIS — Y999 Unspecified external cause status: Secondary | ICD-10-CM | POA: Diagnosis not present

## 2018-09-06 DIAGNOSIS — Z85118 Personal history of other malignant neoplasm of bronchus and lung: Secondary | ICD-10-CM | POA: Diagnosis not present

## 2018-09-06 DIAGNOSIS — W1830XA Fall on same level, unspecified, initial encounter: Secondary | ICD-10-CM | POA: Diagnosis not present

## 2018-09-06 DIAGNOSIS — I10 Essential (primary) hypertension: Secondary | ICD-10-CM | POA: Insufficient documentation

## 2018-09-06 DIAGNOSIS — Z79899 Other long term (current) drug therapy: Secondary | ICD-10-CM | POA: Diagnosis not present

## 2018-09-06 DIAGNOSIS — J449 Chronic obstructive pulmonary disease, unspecified: Secondary | ICD-10-CM | POA: Insufficient documentation

## 2018-09-06 DIAGNOSIS — T07XXXA Unspecified multiple injuries, initial encounter: Secondary | ICD-10-CM | POA: Diagnosis present

## 2018-09-06 DIAGNOSIS — S20211A Contusion of right front wall of thorax, initial encounter: Secondary | ICD-10-CM | POA: Diagnosis not present

## 2018-09-06 DIAGNOSIS — W19XXXA Unspecified fall, initial encounter: Secondary | ICD-10-CM

## 2018-09-06 MED ORDER — LIDOCAINE 5 % EX OINT: 1.0000 "application " | TOPICAL_OINTMENT | Freq: Three times a day (TID) | CUTANEOUS | 0 refills | Status: AC | PRN

## 2018-09-06 NOTE — ED Notes (Signed)
Patient transported to CT 

## 2018-09-06 NOTE — ED Provider Notes (Signed)
Emergency Department Provider Note   I have reviewed the triage vital signs and the nursing notes.   HISTORY  Chief Complaint Fall   HPI Margaret Hale is a 84 y.o. female with PMH of CAD and osteoporosis residing at [facility] is to the emergency department for evaluation after mechanical fall.  The patient was reportedly in her wheelchair when she flipped backwards striking the back of her head.  Patient states she is not sure how this happened but remained awake and alert through the event.  She is describing some right-sided "rib" pain which began after the fall.  She denies any chest pain or shortness of breath prior to falling.  No heart palpitations.  Patient states that her chest pain is worse with touching in the area and movement.  She denies any numbness or tingling   Past Medical History:  Diagnosis Date   Adenomatous polyp 02/2009 &02/2012   Anxiety    Baker's cyst    L KNEE   Bulging lumbar disc 1990   CAD (coronary artery disease) 2007   PCI of RCA, bare metal stent.....Dr. Radford Pax   Cancer [facility])    lung mass   Disc disorder of lumbar region    2 BULDGING DISCS DX 1990 BY DR. DALLDORF   Dizzy    Hx of radiation therapy 2018   [facility]   Hyperlipidemia    Hypertension    Takes Novasc ,Clonidine, & Losartan   Lipoma of back    Menopausal flushing    Multiple thyroid nodules    No pertinent past medical history    lung mass  occas cough    Osteoporosis    Positive test for herpes simplex virus (HSV) antibody    Sinusitis, chronic    Spondylosis    NECK   Thyroid disease    nodules,stable on u/s 2/12   Vitamin D deficiency     Patient Active Problem List   Diagnosis Date Noted   Port-A-Cath in place 07/15/2018   UTI (urinary tract infection) 11/19/2015   Port catheter in place 09/16/2015   Small cell carcinoma of right lung ([place]) 08/03/2015   Brain metastasis ([place]) 07/14/2015   S/P craniotomy 06/09/2015    Benign essential HTN 02/11/2014   Dyslipidemia 02/11/2014   Chest pain 02/11/2014   Restless leg syndrome 02/11/2014   COPD (chronic obstructive pulmonary disease) ([place]) 09/17/2011   Pulmonary nodules 09/13/2011   CAD (coronary artery disease)    Thyroid disease    Baker's cyst    Vitamin D deficiency    Disc disorder of lumbar region     Past Surgical History:  Procedure Laterality Date   CARPAL TUNNEL RELEASE  06/26/08        04/20/09   RIGHT HAND          LEFT HAND   CORONARY ANGIOPLASTY     dr h Tamala Julian [phone] with one stent   CRANIOTOMY Right 06/09/2015   Procedure: CRANIOTOMY TUMOR EXCISION WITH APPLICATION OF CRANIAL NAVIGATION;  Surgeon: Newman Pies, MD;  Location: [facility];  Service: Neurosurgery;  Laterality: Right;  Right temporal craniotomy for resection of tumor   EYE SURGERY  01/07/09   LEFT CATARACT   EYE SURGERY  01/21/09   RIGHT CATARACT   FINGER CONTRACTURE RELEASE  8/12    Dr. Amedeo Plenty   right hand trigger finger release   MEDIASTINOSCOPY  08/02/2011   Procedure: MEDIASTINOSCOPY;  Surgeon: Nicanor Alcon, MD;  Location: [facility];  Service: Thoracic;  Laterality: N/A;   right power port placement Right     Allergies Food; Lipitor [atorvastatin calcium]; Tape; and Pantoprazole sodium  Family History  Problem Relation Age of Onset   Healthy Son    Healthy Son    Anesthesia problems Neg Hx    Hypotension Neg Hx    Malignant hyperthermia Neg Hx    Pseudochol deficiency Neg Hx     Social History Social History   Tobacco Use   Smoking status: Former Smoker    Packs/day: 0.50    Years: 10.00    Pack years: 5.00    Types: Cigarettes    Last attempt to quit: 07/12/2005    Years since quitting: 13.1   Smokeless tobacco: Never Used  Substance Use Topics   Alcohol use: No   Drug use: No    Review of Systems  Constitutional: No fever/chills Eyes: No visual changes. ENT: No sore throat. Cardiovascular: Denies chest  pain. Respiratory: Denies shortness of breath. Gastrointestinal: No abdominal pain.  No nausea, no vomiting.  No diarrhea.  No constipation. Genitourinary: Negative for dysuria. Musculoskeletal: Positive right lateral chest wall pain.  Skin: Negative for rash. Neurological: Negative for focal weakness or numbness. Positive HA.   10-point ROS otherwise negative.  ____________________________________________   PHYSICAL EXAM:  VITAL SIGNS: ED Triage Vitals  Enc Vitals Group     BP 09/06/18 1720 (!) 159/71     Pulse Rate 09/06/18 1720 60     Resp 09/06/18 1720 16     Temp --      Temp src --      SpO2 09/06/18 1720 99 %     Weight 09/06/18 1721 175 lb (79.4 kg)     Height 09/06/18 1721 5\' 4"  (1.626 m)   Constitutional: Alert. Well appearing and in no acute distress. Eyes: Conjunctivae are normal. PERRL.  Head: Hematoma to the occipital scalp without laceration or abrasion.  Nose: No congestion/rhinnorhea. Mouth/Throat: Mucous membranes are moist.  Oropharynx non-erythematous. Neck: No stridor. Mild midline and paraspinal cervical spine tenderness to palpation. Cardiovascular: Normal rate, regular rhythm. Good peripheral circulation. Grossly normal heart sounds.   Respiratory: Normal respiratory effort.  No retractions. Lungs CTAB. Gastrointestinal: Soft and nontender. No distention.  Musculoskeletal: No lower extremity tenderness nor edema. No gross deformities of extremities. Normal active and passive ROM of the bilateral hips and knees.  Neurologic:  Normal speech and language. No gross focal neurologic deficits are appreciated.  Skin:  Skin is warm, dry and intact. No rash noted.  ____________________________________________  RADIOLOGY  Dg Ribs Unilateral W/chest Right  Result Date: 09/06/2018 CLINICAL DATA:  Right rib pain after fall. EXAM: RIGHT RIBS AND CHEST - 3+ VIEW COMPARISON:  Chest CT 07/15/2018 FINDINGS: No visualized right rib fracture. No evidence of focal  bone lesion. Right chest port remains in place. Normal heart size with aortic atherosclerosis. Pulmonary nodules on prior CT are not as well visualized. No pneumothorax, acute airspace disease or pleural effusion. IMPRESSION: No evidence of right rib fracture or acute abnormality. Electronically Signed   By: Keith Rake M.D.   On: 09/06/2018 19:53   Ct Head Wo Contrast  Result Date: 09/06/2018 CLINICAL DATA:  [place] today, injury to back of head. EXAM: CT HEAD WITHOUT CONTRAST CT CERVICAL SPINE WITHOUT CONTRAST TECHNIQUE: Multidetector CT imaging of the head and cervical spine was performed following the standard protocol without intravenous contrast. Multiplanar CT image reconstructions of the cervical spine were also generated. COMPARISON:  Brain MRI dated 10/23/2017. FINDINGS: CT HEAD FINDINGS Brain: Poorly defined low-density within the vermis of the cerebellum, bilateral, similar to the extent seen on brain MRI of 10/23/2017, questionably increased mass effect on the fourth ventricle. Chronic small vessel ischemic changes again noted within the bilateral periventricular and subcortical white matter regions. Supratentorial ventricles are not significantly changed in size or configuration. No parenchymal or extra-axial hemorrhage seen. Vascular: Chronic calcified atherosclerotic changes of the large vessels at the skull base. No unexpected hyperdense vessel. Skull: No acute findings. Surgical changes of RIGHT frontal-parietal-temporal craniotomy. Sinuses/Orbits: No acute finding. Other: None. CT CERVICAL SPINE FINDINGS Alignment: Mild scoliosis. No evidence of acute vertebral body subluxation. Skull base and vertebrae: No fracture line or displaced fracture fragment seen. No acute or suspicious osseous lesion. Soft tissues and spinal canal: No prevertebral fluid or swelling. No visible canal hematoma. Disc levels: Disc space heights appear well maintained throughout. No more than mild central canal  stenosis at any level. DISH like spurring along the anterior margins of the cervical and upper thoracic spine. Upper chest: No acute findings. Chronic scarring/fibrosis and emphysematous blebs at the lung apices. Other: Hypodense mass within the RIGHT thyroid lobe measures 2.7 cm, versus multiple smaller lesions. Bilateral carotid atherosclerosis. IMPRESSION: 1. Ill-defined low-density areas within the vermis of the cerebellum bilaterally, compatible with edema, similar to the extent seen on brain MRI of 10/23/2017, compatible with the probable viable tumor described on most recent brain MRI report dated 10/23/2017. Perhaps slightly increased mass effect on the fourth ventricle. Supratentorial ventricles are stable in size and configuration. 2. No intracranial hemorrhage. 3. No fracture or acute subluxation within the cervical spine. Mild scoliosis. 4. 2.7 cm hypodense mass within the right thyroid lobe, versus multiple smaller lesions. Per consensus guidelines, would consider nonemergent thyroid ultrasound for further characterization. 5. Carotid atherosclerosis. 6. Additional chronic/incidental findings detailed above. Electronically Signed   By: Franki Cabot M.D.   On: 09/06/2018 19:24   Ct Cervical Spine Wo Contrast  Result Date: 09/06/2018 CLINICAL DATA:  [place] today, injury to back of head. EXAM: CT HEAD WITHOUT CONTRAST CT CERVICAL SPINE WITHOUT CONTRAST TECHNIQUE: Multidetector CT imaging of the head and cervical spine was performed following the standard protocol without intravenous contrast. Multiplanar CT image reconstructions of the cervical spine were also generated. COMPARISON:  Brain MRI dated 10/23/2017. FINDINGS: CT HEAD FINDINGS Brain: Poorly defined low-density within the vermis of the cerebellum, bilateral, similar to the extent seen on brain MRI of 10/23/2017, questionably increased mass effect on the fourth ventricle. Chronic small vessel ischemic changes again noted within the bilateral  periventricular and subcortical white matter regions. Supratentorial ventricles are not significantly changed in size or configuration. No parenchymal or extra-axial hemorrhage seen. Vascular: Chronic calcified atherosclerotic changes of the large vessels at the skull base. No unexpected hyperdense vessel. Skull: No acute findings. Surgical changes of RIGHT frontal-parietal-temporal craniotomy. Sinuses/Orbits: No acute finding. Other: None. CT CERVICAL SPINE FINDINGS Alignment: Mild scoliosis. No evidence of acute vertebral body subluxation. Skull base and vertebrae: No fracture line or displaced fracture fragment seen. No acute or suspicious osseous lesion. Soft tissues and spinal canal: No prevertebral fluid or swelling. No visible canal hematoma. Disc levels: Disc space heights appear well maintained throughout. No more than mild central canal stenosis at any level. DISH like spurring along the anterior margins of the cervical and upper thoracic spine. Upper chest: No acute findings. Chronic scarring/fibrosis and emphysematous blebs at the lung apices. Other: Hypodense mass within the RIGHT  thyroid lobe measures 2.7 cm, versus multiple smaller lesions. Bilateral carotid atherosclerosis. IMPRESSION: 1. Ill-defined low-density areas within the vermis of the cerebellum bilaterally, compatible with edema, similar to the extent seen on brain MRI of 10/23/2017, compatible with the probable viable tumor described on most recent brain MRI report dated 10/23/2017. Perhaps slightly increased mass effect on the fourth ventricle. Supratentorial ventricles are stable in size and configuration. 2. No intracranial hemorrhage. 3. No fracture or acute subluxation within the cervical spine. Mild scoliosis. 4. 2.7 cm hypodense mass within the right thyroid lobe, versus multiple smaller lesions. Per consensus guidelines, would consider nonemergent thyroid ultrasound for further characterization. 5. Carotid atherosclerosis. 6.  Additional chronic/incidental findings detailed above. Electronically Signed   By: Franki Cabot M.D.   On: 09/06/2018 19:24    ____________________________________________   PROCEDURES  Procedure(s) performed:   Procedures  None  ____________________________________________   INITIAL IMPRESSION / ASSESSMENT AND PLAN / ED COURSE  Pertinent labs & imaging results that were available during my care of the patient were reviewed by me and considered in my medical decision making (see chart for details).   Presents to the emergency department for evaluation after fall out of her wheelchair.  She has a hematoma in the occipital scalp without laceration.  Some mostly paraspinal cervical spine tenderness to palpation.  No neurologic deficit.  Patient also with focal tenderness to palpation of the right lateral ribs.  No bruising, paradoxical movement, crepitus.  Plan for right rib x-ray along with CT imaging of the head and cervical spine. Patient is not anticoagulated.  08:00 PM CT imaging reviewed.  CT head similar to prior images.  Patient has thyroid nodule.  Placed this in the discharge paperwork and discussed with patient regarding follow-up with the PCP to schedule ultrasound as needed.  Suspect the patient has a right-sided rib contusion.  No evidence of fracture on x-ray.  Provided an incentive spirometer and topical pain medication.  Discussed the findings with the patient and plan.  She is comfortable with plan at discharge. ____________________________________________  FINAL CLINICAL IMPRESSION(S) / ED DIAGNOSES  Final diagnoses:  Injury of head, initial encounter  Fall, initial encounter  Strain of neck muscle, initial encounter  Contusion of rib on right side, initial encounter  Thyroid nodule    NEW OUTPATIENT MEDICATIONS STARTED DURING THIS VISIT:  New Prescriptions   LIDOCAINE (XYLOCAINE) 5 % OINTMENT    Apply 1 application topically 3 (three) times daily as needed.     Note:  This document was prepared using Dragon voice recognition software and may include unintentional dictation errors.  Nanda Quinton, MD Emergency Medicine    Grasiela Jonsson, [facility], MD 09/06/18 2009

## 2018-09-06 NOTE — ED Triage Notes (Signed)
Pt flipped back in her wheel chair & hit her head while at the [facility] [place] where she resides. She also c/o of Rt lower rib pain.

## 2018-09-06 NOTE — Discharge Instructions (Signed)
You were seen in the emergency department for evaluation after fall.  Your CT scan looks similar to your prior scans.  We did not find any evidence of bleeding or fracture.  You do have a lesion on your thyroid gland which needs to be followed up by your PCP, likely with an ultrasound.  Discussed this with them at your next visit.   You can apply the lidocaine ointment to the ribs as needed.  Use the incentive spirometer as instructed to prevent pneumonia.  Return to the emergency department with any new or suddenly worsening symptoms.

## 2018-10-01 ENCOUNTER — Telehealth: Payer: Self-pay | Admitting: *Deleted

## 2018-10-01 NOTE — Telephone Encounter (Signed)
Attempted to schedule CT scan. Patient is now in SNF and will not be allowed to return if she leaves facility for xray or MD visit. She wishes to wait. Will f/u in 1 month regarding scan and MD visit.

## 2018-10-04 ENCOUNTER — Telehealth: Payer: Self-pay | Admitting: *Deleted

## 2018-10-04 NOTE — Telephone Encounter (Signed)
Patient past due her port flush and labs. Facility is willing to transport her and allow her to return to facility. Will send scheduling message and should call back [phone] and ask for [facility] in scheduling.

## 2018-10-09 ENCOUNTER — Other Ambulatory Visit: Payer: Medicare Other

## 2018-10-09 ENCOUNTER — Ambulatory Visit: Payer: Medicare Other | Admitting: Oncology

## 2018-10-09 ENCOUNTER — Telehealth: Payer: Self-pay | Admitting: Oncology

## 2018-10-09 NOTE — Telephone Encounter (Signed)
Scheduled appts per sch msg. Maureen Chatters at [facility]. Confirmed date and time of appt.

## 2018-10-11 ENCOUNTER — Other Ambulatory Visit: Payer: Self-pay

## 2018-10-11 ENCOUNTER — Inpatient Hospital Stay: Payer: Medicare Other | Attending: Oncology

## 2018-10-11 ENCOUNTER — Inpatient Hospital Stay: Payer: Medicare Other

## 2018-10-11 DIAGNOSIS — Z95828 Presence of other vascular implants and grafts: Secondary | ICD-10-CM

## 2018-10-11 DIAGNOSIS — C3491 Malignant neoplasm of unspecified part of right bronchus or lung: Secondary | ICD-10-CM | POA: Diagnosis not present

## 2018-10-11 DIAGNOSIS — C7931 Secondary malignant neoplasm of brain: Secondary | ICD-10-CM | POA: Diagnosis not present

## 2018-10-11 LAB — BASIC METABOLIC PANEL - CANCER CENTER ONLY
Anion gap: 9 (ref 5–15)
BUN: 21 mg/dL (ref 8–23)
CO2: 31 mmol/L (ref 22–32)
Calcium: 9.3 mg/dL (ref 8.9–10.3)
Chloride: 104 mmol/L (ref 98–111)
Creatinine: 0.8 mg/dL (ref 0.44–1.00)
GFR, Est AFR Am: >60 mL/min (ref >60)
GFR, Estimated: >60 mL/min (ref >60)
Glucose, Bld: 99 mg/dL (ref 70–99)
Potassium: 3.4 mmol/L — ABNORMAL LOW (ref 3.5–5.1)
Sodium: 144 mmol/L (ref 135–145)

## 2018-10-11 MED ORDER — SODIUM CHLORIDE 0.9% FLUSH
10.0000 mL | Freq: Once | INTRAVENOUS | Status: AC
  Administered 2018-10-11: 10 mL
  Filled 2018-10-11: qty 10

## 2018-10-11 MED ORDER — HEPARIN SOD (PORK) LOCK FLUSH 100 UNIT/ML IV SOLN
500.0000 [IU] | Freq: Once | INTRAVENOUS | Status: AC | PRN
  Administered 2018-10-11: 500 [IU] via INTRAVENOUS
  Filled 2018-10-11: qty 5

## 2018-10-11 NOTE — Patient Instructions (Signed)

## 2018-10-16 ENCOUNTER — Other Ambulatory Visit: Payer: Self-pay

## 2018-10-16 ENCOUNTER — Non-Acute Institutional Stay: Payer: Medicare Other | Admitting: Adult Health Nurse Practitioner

## 2018-10-16 DIAGNOSIS — Z515 Encounter for palliative care: Secondary | ICD-10-CM

## 2018-10-17 NOTE — Progress Notes (Signed)
[place] Consult Note Telephone: [phone]  Fax: [phone]  PATIENT NAME: Margaret Hale DOB: 1934-08-12 MRN: [id number]  PRIMARY CARE PROVIDER:   Stark Jock NP  REFERRING PROVIDER:  Stark Jock NP  RESPONSIBLE PARTY:   Elly Modena, son [phone]   Due to the COVID-19 crisis, this visit was done via telemedicine and it was initiated and consent by this patient and or family.  Help with telehealth and providing patient information by [place], RN at the facility      RECOMMENDATIONS and PLAN:  1.  Lung cancer with mets to brain.  Patient has had resection of right temporal brain mass in 05/2015 and had undergone systemic chemotherapy for the lung cancer.  Lung cancer has been fairly stable but brain tumor has increased. SRS to cerebellar lesion on 07/21/2016 and started on pentoxifyilline in 12/2016.  Then in 11/2017 she underwent therometry guided ablation of brain tumor and gamma knife treatment of vermis lesion on 01/10/2018.  Most recent chest CT did show some increase of spiculated right upper lobe lesion with a new right middle lobe lesion. She is currently not on any treatment for her cancer.   Patient has had persistent balance issues with brain tumor and treatment.  She is complaining today of tremors that are from the waist up and are more prominent in her hands and arms.  Son states that this is a new complaint.  Believe this is more than likely due to the brain tumor and not sure if any medical treatment would help with this. Does have MRI scheduled in June to monitor disease progression. Patient is on BP medication for her HTN and adjusting her medications to add propranolol could be tried to see if this would help with her tremors.  Would have to have careful monitoring of her BP and for any increased balance issues with any change of medication.    2.  Sleep disturbance.  Patient states that she has problems sleeping at  night and most nights will get up in her wheelchair and sit up at the nurses station.  She states that nothing helps for her insomnia.  States that anything that has been tried, even chamomile tea, has made her balance issues worse.  Could try to see if melatonin helps and monitor for any increased ataxia.  3.  Mobility.  Patient is able to ambulate very short distances but due to ataxia she uses a wheelchair more often.  Patient has had PT/OT services which just ended on 09/26/2018 with no improvement.  This is more likely related to disease progression though it is very troublesome to the patient. Son states that he has noted that she has had more of a decline in her mobility over the past 60 days. If facility has restorative program, this may be helpful to the patient to help maintain what strength she has and give her a distraction.  4. Goals of care. Patient is a full code.  Son states that he is the HPOA but while his mother is in her right mind wants her to make ACP decisions.  He does state that at a care plan meeting at the facility they discussed ACP and she expressed then wanting everything to be done to prolong life.  He feels as if she may need more education on this.  This will be an ongoing discussion with the patient and the son.  Patient interested in a face to face and reassured her  that as soon as the facilities start letting providers back in that we could have a face to face meeting.  She expressed understanding.  I spent 60 minutes providing this consultation,  from 12:30 to 1:30. More than 50% of the time in this consultation was spent coordinating communication.   HISTORY OF PRESENT ILLNESS:  Margaret Hale is a 84 y.o. year old female with multiple medical problems including right lung cancer with mets to brain, HTN, COPD, CAD, thyroid disease. Palliative Care was asked to help address goals of care.   CODE STATUS: Full Code  PPS: 50% HOSPICE ELIGIBILITY/DIAGNOSIS: TBD  PAST  MEDICAL HISTORY:  Past Medical History:  Diagnosis Date  . Adenomatous polyp 02/2009 &02/2012  . Anxiety   . Baker's cyst    L KNEE  . Bulging lumbar disc 1990  . CAD (coronary artery disease) 2007   PCI of RCA, bare metal stent.....Dr. Radford Pax  . Cancer (HCC)    lung mass  . Disc disorder of lumbar region    2 BULDGING DISCS DX 1990 BY DR. DALLDORF  . Dizzy   . Hx of radiation therapy 2018   [facility]  . Hyperlipidemia   . Hypertension    Takes Novasc ,Clonidine, & Losartan  . Lipoma of back   . Menopausal flushing   . Multiple thyroid nodules   . No pertinent past medical history    lung mass  occas cough   . Osteoporosis   . Positive test for herpes simplex virus (HSV) antibody   . Sinusitis, chronic   . Spondylosis    NECK  . Thyroid disease    nodules,stable on u/s 2/12  . Vitamin D deficiency     SOCIAL HX:  Social History   Tobacco Use  . Smoking status: Former Smoker    Packs/day: 0.50    Years: 10.00    Pack years: 5.00    Types: Cigarettes    Last attempt to quit: 07/12/2005    Years since quitting: 13.2  . Smokeless tobacco: Never Used  Substance Use Topics  . Alcohol use: No    ALLERGIES:  Allergies  Allergen Reactions  . Food     Sausage, hot dogs, salami (rash)  . Lipitor [Atorvastatin Calcium] Other (See Comments)    MYALGIAS  . Tape Other (See Comments)  . Pantoprazole Sodium Rash     PERTINENT MEDICATIONS:  Outpatient Encounter Medications as of 10/16/2018  Medication Sig  . amLODipine (NORVASC) 10 MG tablet Take 10 mg by mouth every evening.   . Cholecalciferol (VITAMIN D) 2000 units CAPS Take by mouth.  . labetalol (NORMODYNE) 200 MG tablet Take 200 mg by mouth 2 (two) times daily.  Marland Kitchen lidocaine (XYLOCAINE) 5 % ointment Apply 1 application topically 3 (three) times daily as needed.  . NON FORMULARY Inject into the vein 2 (two) times daily. IV Vitamin C infusion twice weekly via PAC  . olmesartan-hydrochlorothiazide (BENICAR HCT) 40-25  MG tablet Take 1 tablet by mouth daily.  . Omega-3 Fatty Acids (FISH OIL PO) Take 4,000 Units by mouth 2 (two) times daily.  . pentoxifylline (TRENTAL) 400 MG CR tablet Take 1 tablet (400 mg total) by mouth 2 (two) times daily with a meal. (Patient not taking: Reported on 07/31/2018)  . potassium chloride (K-DUR) 10 MEQ tablet Take 1 tablet (10 mEq total) by mouth daily.  Marland Kitchen rOPINIRole (REQUIP) 1 MG tablet Take 1 mg by mouth at bedtime.  . valsartan (DIOVAN) 320 MG tablet Take  1 tablet (320 mg total) by mouth daily.  . vitamin E (VITAMIN E) 400 UNIT capsule Take 1 capsule (400 Units total) by mouth 2 (two) times daily.   No facility-administered encounter medications on file as of 10/16/2018.       Jenetta Downer, NP

## 2018-11-05 ENCOUNTER — Telehealth: Payer: Self-pay | Admitting: *Deleted

## 2018-11-05 NOTE — Telephone Encounter (Signed)
Patient called asking to have her CT scan and office visit scheduled. Called son and confirmed this is accurate. Per nursing facility, residents are allowed to leave the facility now and return for MD appointments. Called Pat in scheduling department at [facility] and gave appointments for 11/19/18 with arrival for scan at 12:45 pm.

## 2018-11-18 ENCOUNTER — Telehealth: Payer: Self-pay

## 2018-11-18 NOTE — Telephone Encounter (Signed)
Called patient to go over pre-screening questions for upcoming appt. Pt has no voicemail. Could not leave message.

## 2018-11-19 ENCOUNTER — Other Ambulatory Visit: Payer: Self-pay

## 2018-11-19 ENCOUNTER — Encounter (HOSPITAL_COMMUNITY): Payer: Self-pay

## 2018-11-19 ENCOUNTER — Inpatient Hospital Stay: Payer: Medicare Other | Attending: Oncology | Admitting: Nurse Practitioner

## 2018-11-19 ENCOUNTER — Ambulatory Visit (HOSPITAL_COMMUNITY)
Admission: RE | Admit: 2018-11-19 | Discharge: 2018-11-19 | Disposition: A | Discharge status: Home / Self Care | Payer: Medicare Other | Source: Ambulatory Visit | Attending: Oncology | Admitting: Oncology

## 2018-11-19 ENCOUNTER — Encounter: Payer: Self-pay | Admitting: Nurse Practitioner

## 2018-11-19 VITALS — BP 129/56 | HR 54 | Temp 97.8°F | Resp 19

## 2018-11-19 DIAGNOSIS — R260 Ataxic gait: Secondary | ICD-10-CM | POA: Diagnosis not present

## 2018-11-19 DIAGNOSIS — C7931 Secondary malignant neoplasm of brain: Secondary | ICD-10-CM | POA: Diagnosis not present

## 2018-11-19 DIAGNOSIS — Z95828 Presence of other vascular implants and grafts: Secondary | ICD-10-CM | POA: Diagnosis not present

## 2018-11-19 DIAGNOSIS — I10 Essential (primary) hypertension: Secondary | ICD-10-CM | POA: Diagnosis not present

## 2018-11-19 DIAGNOSIS — C3491 Malignant neoplasm of unspecified part of right bronchus or lung: Secondary | ICD-10-CM | POA: Diagnosis present

## 2018-11-19 MED ORDER — SODIUM CHLORIDE 0.9% FLUSH
10.0000 mL | Freq: Once | INTRAVENOUS | Status: AC
  Administered 2018-11-19: 10 mL
  Filled 2018-11-19: qty 10

## 2018-11-19 MED ORDER — HEPARIN SOD (PORK) LOCK FLUSH 100 UNIT/ML IV SOLN
500.0000 [IU] | Freq: Once | INTRAVENOUS | Status: AC | PRN
  Administered 2018-11-19: 500 [IU] via INTRAVENOUS
  Filled 2018-11-19: qty 5

## 2018-11-19 NOTE — Progress Notes (Addendum)
[place] OFFICE PROGRESS NOTE   Diagnosis: Small cell lung cancer  INTERVAL HISTORY:   [name] returns as scheduled.  She denies shortness of breath.  No fever or cough.  She denies pain.  No nausea or vomiting.  No visual disturbance.  Balance continues to be "bad".  She feels "confused".  Objective:  Vital signs in last 24 hours:  Blood pressure (!) 129/56, pulse (!) 54, temperature 97.8 F (36.6 C), temperature source Oral, resp. rate 19, SpO2 100 %.    Resp: Lungs clear bilaterally. Cardio: Regular rate and rhythm. GI: Abdomen soft and nontender.  No hepatomegaly. Vascular: No leg edema. Neuro: Moves all extremities.  Alert and oriented.  Follows commands.  Finger-to-nose grossly intact. Port-A-Cath without erythema.   Lab Results:  Lab Results  Component Value Date   WBC 5.2 12/28/2015   HGB 13.9 12/28/2015   HCT 42.2 12/28/2015   MCV 91.1 12/28/2015   PLT 155 12/28/2015   NEUTROABS 3.1 12/28/2015    Imaging:  No results found.  Medications: I have reviewed the patient's current medications.  Assessment/Plan: 1. Extensive stage small cell lung cancer  resection of right temporal brain mass 06/09/2015 confirmed metastatic small cell carcinoma  Staging PET scan at [facility] 07/22/2015 revealed decreased activity associated with previously seen pulmonary nodules , decreased activity of hypermetabolic mediastinal and bilateral hilar nodes , no new sites of FDG avid metastatic disease compared to a [facility] PET scan from November 2016  Cycle 1 etoposide/carboplatin 08/16/2015  Cycle 2 etoposide/carboplatin 09/14/2015  Cycle 3 etoposide/carboplatin 10/19/2015  Restaging chest CT 11/12/2015 with generally stable appearance of solid, sub-solid and ground glass pulmonary nodules bilaterally. Stable mildly prominent mediastinal lymph nodes. No progressive adenopathy or new findings.  Cycle 4 etoposide/carboplatin 11/16/2015   Brain MRI  01/24/2016-new 4 mm superior vermis metastasis  CT chest 06/07/2016-stable solid/sub-solid pulmonary nodules, stable mediastinal lymph nodes, no evidence of progressive disease  MRI brain 07/05/2016-progression of the cerebellar metastasis with associated edema and effacement of the upper fourth ventricle   SRS to cerebellarlesion 07/21/2016  MRI brain 10/20/2016-decrease in size and mass effect associated with the cerebellar lesion consistent with posttreatment change  MRI brain 01/22/2017-increased size, enhancement, and edema at the vermis metastasis, started onpentoxifyilline  Chest CT 02/14/2017-stable scattered solid for a nodules, stable small B-cell lymph nodes, improvement in sub-solid lesions in the bilateral upper lobes  Brain MRI 04/26/2017-improvement in the treated vermis metastasis, no new lesions  Brain MRI 07/27/2017-increased size in edema associated with cerebellar lesion, observation recommended  CT chest 08/03/2017-stable bilateral lung nodules, no evidence for disease progression  MRI 09/26/2017-enlargement of the cerebellar vermis lesion with progressive bilateral cerebellar edema  MRI brain 10/24/2017-increased size of superior vermis abnormal enhancement, stable central masslike component, stable cerebellar edema, 6 mm enhancement at the ependyma of the right thalamus  stereotactic brain biopsy, insertion of hyperthermia catheter and MRI thermometry guided ablation of brain tumor using cool directional LITT on 12/06/2017.Biopsy showed metastatic small cell carcinoma  MRI brain at [facility] 01/10/2018- new enhancing lesion, increased enhancement surrounding ablation zone in the superior vermis, no change in nonspecific enhancing lesion in the right lateral jugular body  Gamma knife treatment vermis lesion at [facility] on 01/10/2018  CT chest 01/23/2018-no change in small mediastinal nodes and lung nodules  MRI brain at [facility] 03/12/2018-decreased  enhancement at the ablation zone in the vermis with decreased mass-effect on the fourth ventricle, increase in size of an enhancing lesion in  the body of the right lateral ventricle (8 mm), no new enhancing lesion  MRI brain at [facility] 07/25/2018- stable treated lesions at the superior cerebellar vermis and right temporal lobe, similar size and enhancement of ependymal lesion at the body of the right lateral ventricle  CT chest 07/15/2018-increased in size of spiculated right upper lobe lesion, new 3 mm right middle lobe lesion  MRI brain at [facility] 11/04/2018-progression of ependymal/intraventricular tumor in the body and frontal horn of the right lateral ventricle and chiasmatic recess of the third ventricle.    CT chest 11/19/2018-pending  2. Chronic history of mediastinal lymphadenopathy and pulmonary nodules-diagnostic workup in 2013 including bronchoscopy/EBUS , and mediastinoscopy -negative for malignancy, granulomatous changes noted  3. Hypertension  4. History of CAD  5. "Sweats "-most likely related to post menopausal hot flashes  6. 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  The chest CT result from today is not available at present.  Preliminary review indicates stability.  We will follow-up on the final report.  MRI of the brain done earlier this month showed increased size of enhancing lesion in the right lateral ventricle.  Per Dr. Lucious Groves office note the plan is to proceed with gamma knife radiosurgery next month.  Port-A-Cath will be flushed today.  She will return in 6 weeks for the next flush.  We will see her in follow-up in 3 months.  She will contact the office in the interim with any problems.  Patient seen with Dr. Benay Spice.    Ned Card ANP/GNP-BC   11/19/2018  2:21 PM  This was a shared visit with Ned Card.  Margaret Hale was interviewed and examined.  She continues to have balance difficulty, likely related to CNS tumor burden and toxicity from treatment.  She is scheduled to undergo repeat radiosurgery at [facility] in approximately 2 weeks.  The chest CT reveals no evidence of systemic disease progression.  We will contact her son to be sure they are aware of the appointment at [place], [place]

## 2018-11-20 ENCOUNTER — Telehealth: Payer: Self-pay | Admitting: Nurse Practitioner

## 2018-11-20 NOTE — Telephone Encounter (Signed)
Scheduled per [facility]. Mailed printout  °

## 2018-12-05 ENCOUNTER — Emergency Department (HOSPITAL_COMMUNITY)
Admission: EM | Admit: 2018-12-05 | Discharge: 2018-12-06 | Disposition: A | Discharge status: Home / Self Care | Payer: Medicare Other | Attending: Emergency Medicine | Admitting: Emergency Medicine

## 2018-12-05 ENCOUNTER — Encounter (HOSPITAL_COMMUNITY): Payer: Self-pay

## 2018-12-05 ENCOUNTER — Other Ambulatory Visit: Payer: Self-pay

## 2018-12-05 DIAGNOSIS — I2510 Atherosclerotic heart disease of native coronary artery without angina pectoris: Secondary | ICD-10-CM | POA: Diagnosis not present

## 2018-12-05 DIAGNOSIS — I10 Essential (primary) hypertension: Secondary | ICD-10-CM | POA: Diagnosis not present

## 2018-12-05 DIAGNOSIS — Z85118 Personal history of other malignant neoplasm of bronchus and lung: Secondary | ICD-10-CM | POA: Diagnosis not present

## 2018-12-05 DIAGNOSIS — C7931 Secondary malignant neoplasm of brain: Secondary | ICD-10-CM | POA: Insufficient documentation

## 2018-12-05 DIAGNOSIS — Z79899 Other long term (current) drug therapy: Secondary | ICD-10-CM | POA: Insufficient documentation

## 2018-12-05 DIAGNOSIS — J449 Chronic obstructive pulmonary disease, unspecified: Secondary | ICD-10-CM | POA: Insufficient documentation

## 2018-12-05 DIAGNOSIS — Z87891 Personal history of nicotine dependence: Secondary | ICD-10-CM | POA: Insufficient documentation

## 2018-12-05 DIAGNOSIS — Z043 Encounter for examination and observation following other accident: Secondary | ICD-10-CM | POA: Diagnosis not present

## 2018-12-05 DIAGNOSIS — W19XXXA Unspecified fall, initial encounter: Secondary | ICD-10-CM

## 2018-12-05 NOTE — ED Triage Notes (Signed)
Pt from another facility had an unwitnessed fall, down for 15 min. Had head to floor. Recently had cancer treatment done to her forehead. Pt reports that she was dizzy and fell. Weak on feet. Pt currently denies dizziness or pain.

## 2018-12-06 ENCOUNTER — Encounter (HOSPITAL_COMMUNITY): Payer: Self-pay | Admitting: Emergency Medicine

## 2018-12-06 ENCOUNTER — Emergency Department (HOSPITAL_COMMUNITY): Payer: Medicare Other

## 2018-12-06 NOTE — ED Notes (Signed)
Called ptar at 01:43

## 2018-12-06 NOTE — ED Notes (Signed)
Report given to [place] king at [facility]

## 2018-12-06 NOTE — ED Provider Notes (Signed)
[facility] EMERGENCY DEPARTMENT Provider Note   CSN: [id number] Arrival date & time: 12/05/18  2304     History   Chief Complaint Chief Complaint  Patient presents with   Fall    HPI Margaret Hale is a 84 y.o. female.     The history is provided by the patient.  Fall This is a new problem. The current episode started less than 1 hour ago. The problem occurs rarely. The problem has been resolved. Pertinent negatives include no chest pain, no abdominal pain and no shortness of breath. Nothing aggravates the symptoms. Nothing relieves the symptoms. She has tried nothing for the symptoms. The treatment provided no relief.    Past Medical History:  Diagnosis Date   Adenomatous polyp 02/2009 &02/2012   Anxiety    Baker's cyst    L KNEE   Bulging lumbar disc 1990   CAD (coronary artery disease) 2007   PCI of RCA, bare metal stent.....Dr. Radford Pax   Cancer [facility])    lung mass   Disc disorder of lumbar region    2 BULDGING DISCS DX 1990 BY DR. DALLDORF   Dizzy    Hx of radiation therapy 2018   [place] [facility]   Hyperlipidemia    Hypertension    Takes Novasc ,Clonidine, & Losartan   Lipoma of back    Menopausal flushing    Multiple thyroid nodules    No pertinent past medical history    lung mass  occas cough    Osteoporosis    Positive test for herpes simplex virus (HSV) antibody    Sinusitis, chronic    Spondylosis    NECK   Thyroid disease    nodules,stable on u/s 2/12   Vitamin D deficiency     Patient Active Problem List   Diagnosis Date Noted   Port-A-Cath in place 07/15/2018   UTI (urinary tract infection) 11/19/2015   Port catheter in place 09/16/2015   Small cell carcinoma of right lung ([place]) 08/03/2015   Brain metastasis ([place]) 07/14/2015   S/P craniotomy 06/09/2015   Benign essential HTN 02/11/2014   Dyslipidemia 02/11/2014   Chest pain 02/11/2014   Restless leg syndrome 02/11/2014   COPD (chronic  obstructive pulmonary disease) ([place]) 09/17/2011   Pulmonary nodules 09/13/2011   CAD (coronary artery disease)    Thyroid disease    Baker's cyst    Vitamin D deficiency    Disc disorder of lumbar region     Past Surgical History:  Procedure Laterality Date   CARPAL TUNNEL RELEASE  06/26/08        04/20/09   RIGHT HAND          LEFT HAND   CORONARY ANGIOPLASTY     dr h Tamala Julian [phone] with one stent   CRANIOTOMY Right 06/09/2015   Procedure: CRANIOTOMY TUMOR EXCISION WITH APPLICATION OF CRANIAL NAVIGATION;  Surgeon: Newman Pies, MD;  Location: [facility];  Service: Neurosurgery;  Laterality: Right;  Right temporal craniotomy for resection of tumor   EYE SURGERY  01/07/09   LEFT CATARACT   EYE SURGERY  01/21/09   RIGHT CATARACT   FINGER CONTRACTURE RELEASE  8/12    Dr. Amedeo Plenty   right hand trigger finger release   MEDIASTINOSCOPY  08/02/2011   Procedure: MEDIASTINOSCOPY;  Surgeon: Nicanor Alcon, MD;  Location: [facility] OR;  Service: Thoracic;  Laterality: N/A;   right power port placement Right      OB History   No obstetric history  on file.      Home Medications    Prior to Admission medications   Medication Sig Start Date End Date Taking? Authorizing Provider  amLODipine (NORVASC) 10 MG tablet Take 10 mg by mouth every evening.     [provider]  Cholecalciferol (VITAMIN D) 2000 units CAPS Take by mouth.    [provider]  labetalol (NORMODYNE) 200 MG tablet Take 200 mg by mouth 2 (two) times daily.    [provider]  lidocaine (XYLOCAINE) 5 % ointment Apply 1 application topically 3 (three) times daily as needed. 09/06/18   Long, Wonda Olds, MD  NON FORMULARY Inject into the vein 2 (two) times daily. IV Vitamin C infusion twice weekly via PAC    [provider]  olmesartan-hydrochlorothiazide (BENICAR HCT) 40-25 MG tablet Take 1 tablet by mouth daily. 04/23/17   [provider]  Omega-3 Fatty Acids (FISH OIL PO) Take  4,000 Units by mouth 2 (two) times daily.    [provider]  pentoxifylline (TRENTAL) 400 MG CR tablet Take 1 tablet (400 mg total) by mouth 2 (two) times daily with a meal. Patient not taking: Reported on 07/31/2018 11/01/17   Bruning, Ashlyn, PA-C  potassium chloride (K-DUR) 10 MEQ tablet Take 1 tablet (10 mEq total) by mouth daily. 07/31/18   Ladell Pier, MD  rOPINIRole (REQUIP) 1 MG tablet Take 1 mg by mouth at bedtime. 06/28/15   [provider]  valsartan (DIOVAN) 320 MG tablet Take 1 tablet (320 mg total) by mouth daily. 07/31/18   Ladell Pier, MD  vitamin E (VITAMIN E) 400 UNIT capsule Take 1 capsule (400 Units total) by mouth 2 (two) times daily. 01/25/17   Hayden Pedro, PA-C    Family History Family History  Problem Relation Age of Onset   Healthy Son    Healthy Son    Anesthesia problems Neg Hx    Hypotension Neg Hx    Malignant hyperthermia Neg Hx    Pseudochol deficiency Neg Hx     Social History Social History   Tobacco Use   Smoking status: Former Smoker    Packs/day: 0.50    Years: 10.00    Pack years: 5.00    Types: Cigarettes    Quit date: 07/12/2005    Years since quitting: 13.4   Smokeless tobacco: Never Used  Substance Use Topics   Alcohol use: No   Drug use: No     Allergies   Food, Lipitor [atorvastatin calcium], Tape, and Pantoprazole sodium   Review of Systems Review of Systems  Constitutional: Negative for fever.  Eyes: Negative for visual disturbance.  Respiratory: Negative for shortness of breath.   Cardiovascular: Negative for chest pain.  Gastrointestinal: Negative for abdominal pain.  Musculoskeletal: Negative for back pain, gait problem, neck pain and neck stiffness.  Neurological: Negative for seizures, speech difficulty, weakness, light-headedness and numbness.  All other systems reviewed and are negative.    Physical Exam Updated Vital Signs BP (!) 154/70    Pulse (!) 58    Resp 18     SpO2 94%   Physical Exam Constitutional:      General: She is not in acute distress.    Appearance: She is normal weight.  HENT:     Head: Normocephalic and atraumatic.     Right Ear: Tympanic membrane normal.     Left Ear: Tympanic membrane normal.     Nose: Nose normal.  Eyes:  Extraocular Movements: Extraocular movements intact.     Conjunctiva/sclera: Conjunctivae normal.     Pupils: Pupils are equal, round, and reactive to light.  Neck:     Musculoskeletal: Normal range of motion and neck supple.  Cardiovascular:     Rate and Rhythm: Normal rate and regular rhythm.     Pulses: Normal pulses.     Heart sounds: Normal heart sounds.  Pulmonary:     Effort: Pulmonary effort is normal.     Breath sounds: Normal breath sounds.  Abdominal:     General: Abdomen is flat. Bowel sounds are normal.     Tenderness: There is no abdominal tenderness. There is no guarding or rebound.  Musculoskeletal: Normal range of motion.  Skin:    General: Skin is warm and dry.     Capillary Refill: Capillary refill takes less than 2 seconds.  Neurological:     General: No focal deficit present.     Mental Status: She is alert and oriented to person, place, and time.  Psychiatric:        Mood and Affect: Mood normal.        Behavior: Behavior normal.      ED Treatments / Results  Labs (all labs ordered are listed, but only abnormal results are displayed) Labs Reviewed - No data to display  EKG None  Radiology Ct Head Wo Contrast  Result Date: 12/06/2018 CLINICAL DATA:  Post unwitnessed fall. History of craniotomy and brain radiation. EXAM: CT HEAD WITHOUT CONTRAST CT CERVICAL SPINE WITHOUT CONTRAST TECHNIQUE: Multidetector CT imaging of the head and cervical spine was performed following the standard protocol without intravenous contrast. Multiplanar CT image reconstructions of the cervical spine were also generated. COMPARISON:  Head and cervical spine CT 09/06/2018. Brain MRI in  2019. FINDINGS: CT HEAD FINDINGS Brain: No acute hemorrhage. Stable degree of atrophy and ventriculomegaly from prior exam. Right temporal encephalomalacia is unchanged. Hypodensity in the cerebellar vermis, not significantly changed from prior CT, mild mass-effect on the fourth ventricle again seen. Small ependymal soft tissue density adjacent to the right thalamus, grossly stable allowing for differences in scan plane. No subdural or extra-axial collection. Vascular: Atherosclerosis of skullbase vasculature without hyperdense vessel or abnormal calcification. Skull: Right temporal craniotomy. No skull fracture. Sinuses/Orbits: No acute findings. Bilateral cataract resection. Other: None. CT CERVICAL SPINE FINDINGS Alignment: Trace broad-based leftward curvature of cervical spine. No traumatic subluxation. Skull base and vertebrae: No fracture or focal lesion. No acute fracture. Vertebral body heights are maintained. The dens and skull base are intact. Soft tissues and spinal canal: No prevertebral fluid or swelling. No visible canal hematoma. Disc levels: Large endplate spurs with mild disc space narrowing, scattered facet arthropathy. Degenerative changes are stable from prior. Upper chest: Biapical pleuroparenchymal scarring. Right central line partially included. Right thyroid nodule again seen. Other: Carotid calcifications. IMPRESSION: 1. No evidence of acute intracranial traumatic injury. 2. Unchanged cerebellar vermis hypodensity since Audreana Hancox 2020 CT. Stable degree of atrophy and ventriculomegaly. Unchanged chronic small vessel ischemia. 3. No evidence of acute traumatic injury to the cervical spine. 4. Multilevel degenerative changes. Electronically Signed   By: Keith Rake M.D.   On: 12/06/2018 00:48   Ct Cervical Spine Wo Contrast  Result Date: 12/06/2018 CLINICAL DATA:  Post unwitnessed fall. History of craniotomy and brain radiation. EXAM: CT HEAD WITHOUT CONTRAST CT CERVICAL SPINE WITHOUT  CONTRAST TECHNIQUE: Multidetector CT imaging of the head and cervical spine was performed following the standard protocol without intravenous contrast. Multiplanar  CT image reconstructions of the cervical spine were also generated. COMPARISON:  Head and cervical spine CT 09/06/2018. Brain MRI in 2019. FINDINGS: CT HEAD FINDINGS Brain: No acute hemorrhage. Stable degree of atrophy and ventriculomegaly from prior exam. Right temporal encephalomalacia is unchanged. Hypodensity in the cerebellar vermis, not significantly changed from prior CT, mild mass-effect on the fourth ventricle again seen. Small ependymal soft tissue density adjacent to the right thalamus, grossly stable allowing for differences in scan plane. No subdural or extra-axial collection. Vascular: Atherosclerosis of skullbase vasculature without hyperdense vessel or abnormal calcification. Skull: Right temporal craniotomy. No skull fracture. Sinuses/Orbits: No acute findings. Bilateral cataract resection. Other: None. CT CERVICAL SPINE FINDINGS Alignment: Trace broad-based leftward curvature of cervical spine. No traumatic subluxation. Skull base and vertebrae: No fracture or focal lesion. No acute fracture. Vertebral body heights are maintained. The dens and skull base are intact. Soft tissues and spinal canal: No prevertebral fluid or swelling. No visible canal hematoma. Disc levels: Large endplate spurs with mild disc space narrowing, scattered facet arthropathy. Degenerative changes are stable from prior. Upper chest: Biapical pleuroparenchymal scarring. Right central line partially included. Right thyroid nodule again seen. Other: Carotid calcifications. IMPRESSION: 1. No evidence of acute intracranial traumatic injury. 2. Unchanged cerebellar vermis hypodensity since Rami Waddle 2020 CT. Stable degree of atrophy and ventriculomegaly. Unchanged chronic small vessel ischemia. 3. No evidence of acute traumatic injury to the cervical spine. 4. Multilevel  degenerative changes. Electronically Signed   By: Keith Rake M.D.   On: 12/06/2018 00:48    Procedures Procedures (including critical care time)    Final Clinical Impressions(s) / ED Diagnoses      Return for intractable cough, coughing up blood,fevers >100.4 unrelieved by medication, shortness of breath, intractable vomiting, chest pain, shortness of breath, weakness,numbness, changes in speech, facial asymmetry,abdominal pain, passing out,Inability to tolerate liquids or food, cough, altered mental status or any concerns. No signs of systemic illness or infection. The patient is nontoxic-appearing on exam and vital signs are within normal limits.   I have reviewed the triage vital signs and the nursing notes. Pertinent labs &imaging results that were available during my care of the patient were reviewed by me and considered in my medical decision making (see chart for details).  After history, exam, and medical workup I feel the patient has been appropriately medically screened and is safe for discharge home. Pertinent diagnoses were discussed with the patient. Patient was given return precautions     Shelda Truby, MD 12/06/18 [phone]

## 2018-12-06 NOTE — ED Notes (Signed)
Pt moved to hallway waiting for ptar.

## 2018-12-06 NOTE — ED Notes (Signed)
All appropriate discharge materials reviewed at length with patient. Time for questions provided. Pt has no other questions at this time and verbalizes understanding of all provided materials.  

## 2018-12-31 ENCOUNTER — Inpatient Hospital Stay: Payer: Medicare Other | Attending: Oncology

## 2019-01-14 ENCOUNTER — Telehealth: Payer: Self-pay | Admitting: Oncology

## 2019-01-14 NOTE — Telephone Encounter (Signed)
Called pt per 8/18 sch message - unable to reach pt and vmail is full . Unable to leave message

## 2019-02-20 ENCOUNTER — Other Ambulatory Visit: Payer: Self-pay

## 2019-02-20 ENCOUNTER — Emergency Department (HOSPITAL_COMMUNITY): Payer: Medicare Other

## 2019-02-20 ENCOUNTER — Inpatient Hospital Stay: Payer: Medicare Other | Attending: Oncology | Admitting: Oncology

## 2019-02-20 ENCOUNTER — Inpatient Hospital Stay: Payer: Medicare Other

## 2019-02-20 ENCOUNTER — Emergency Department (HOSPITAL_COMMUNITY)
Admission: EM | Admit: 2019-02-20 | Discharge: 2019-02-20 | Disposition: A | Discharge status: Home / Self Care | Payer: Medicare Other | Attending: Emergency Medicine | Admitting: Emergency Medicine

## 2019-02-20 ENCOUNTER — Telehealth: Payer: Self-pay | Admitting: Cardiology

## 2019-02-20 DIAGNOSIS — I10 Essential (primary) hypertension: Secondary | ICD-10-CM | POA: Insufficient documentation

## 2019-02-20 DIAGNOSIS — E079 Disorder of thyroid, unspecified: Secondary | ICD-10-CM | POA: Diagnosis not present

## 2019-02-20 DIAGNOSIS — I2510 Atherosclerotic heart disease of native coronary artery without angina pectoris: Secondary | ICD-10-CM | POA: Diagnosis not present

## 2019-02-20 DIAGNOSIS — R0789 Other chest pain: Secondary | ICD-10-CM | POA: Diagnosis not present

## 2019-02-20 DIAGNOSIS — Z79899 Other long term (current) drug therapy: Secondary | ICD-10-CM | POA: Diagnosis not present

## 2019-02-20 DIAGNOSIS — Z87891 Personal history of nicotine dependence: Secondary | ICD-10-CM | POA: Insufficient documentation

## 2019-02-20 DIAGNOSIS — R079 Chest pain, unspecified: Secondary | ICD-10-CM

## 2019-02-20 LAB — HEPATIC FUNCTION PANEL
ALT: 32 U/L (ref 0–44)
AST: 23 U/L (ref 15–41)
Albumin: 3.6 g/dL (ref 3.5–5.0)
Alkaline Phosphatase: 76 U/L (ref 38–126)
Bilirubin, Direct: 0.1 mg/dL (ref 0.0–0.2)
Indirect Bilirubin: 0.5 mg/dL (ref 0.3–0.9)
Total Bilirubin: 0.6 mg/dL (ref 0.3–1.2)
Total Protein: 6.6 g/dL (ref 6.5–8.1)

## 2019-02-20 LAB — BASIC METABOLIC PANEL
Anion gap: 12 (ref 5–15)
BUN: 36 mg/dL — ABNORMAL HIGH (ref 8–23)
CO2: 24 mmol/L (ref 22–32)
Calcium: 9.3 mg/dL (ref 8.9–10.3)
Chloride: 105 mmol/L (ref 98–111)
Creatinine, Ser: 0.82 mg/dL (ref 0.44–1.00)
GFR calc Af Amer: >60 mL/min (ref >60)
GFR calc non Af Amer: >60 mL/min (ref >60)
Glucose, Bld: 133 mg/dL — ABNORMAL HIGH (ref 70–99)
Potassium: 4.2 mmol/L (ref 3.5–5.1)
Sodium: 141 mmol/L (ref 135–145)

## 2019-02-20 LAB — CBC
HCT: 43.2 % (ref 36.0–46.0)
Hemoglobin: 13.8 g/dL (ref 12.0–15.0)
MCH: 29.6 pg (ref 26.0–34.0)
MCHC: 31.9 g/dL (ref 30.0–36.0)
MCV: 92.7 fL (ref 80.0–100.0)
Platelets: 207 10*3/uL (ref 150–400)
RBC: 4.66 MIL/uL (ref 3.87–5.11)
RDW: 15.5 % (ref 11.5–15.5)
WBC: 8 10*3/uL (ref 4.0–10.5)
nRBC: 0 % (ref 0.0–0.2)

## 2019-02-20 LAB — TROPONIN I (HIGH SENSITIVITY)
Troponin I (High Sensitivity): 17 ng/L (ref <18)
Troponin I (High Sensitivity): 17 ng/L (ref <18)

## 2019-02-20 MED ORDER — SODIUM CHLORIDE 0.9% FLUSH: 3.0000 mL | Freq: Once | INTRAVENOUS | Status: DC

## 2019-02-20 MED ORDER — IOHEXOL 350 MG/ML SOLN
100.0000 mL | Freq: Once | INTRAVENOUS | Status: AC | PRN
  Administered 2019-02-20: 21:00:00 100 mL via INTRAVENOUS

## 2019-02-20 NOTE — ED Triage Notes (Signed)
Came in via ems; from [place]; c/o chest pain since 1pm along w/ dizziness.

## 2019-02-20 NOTE — ED Notes (Signed)
Pt transported to CT ?

## 2019-02-20 NOTE — ED Triage Notes (Signed)
Per ems NTG x 1 anf Baby ASA x4 given.

## 2019-02-20 NOTE — ED Notes (Signed)
Ptar called for pt 

## 2019-02-20 NOTE — Telephone Encounter (Signed)
I spoke to the patient's son Ronalee Belts) who said that his mother called him from her living facility and mentioned that she is presently having CP.  I instructed him to transport her to the ED or call EMS to do so for further evaluation.  He verbalized understanding.

## 2019-02-20 NOTE — ED Notes (Signed)
Attempted to call Son for update regards to discharge no answer.

## 2019-02-20 NOTE — Telephone Encounter (Signed)
New message   Pt c/o of Chest Pain: STAT if CP now or developed within 24 hours  1. Are you having CP right now? Yes   2. Are you experiencing any other symptoms (ex. SOB, nausea, vomiting, sweating)? No   3. How long have you been experiencing CP? Patient has had chest pains for a few weeks per patient's son.  4. Is your CP continuous or coming and going? Coming and going   5. Have you taken Nitroglycerin?no  ?

## 2019-02-20 NOTE — ED Notes (Signed)
Attempted to call nursing facility: [facility] x2 to give report to receiving RN. No answer at [phone].

## 2019-02-20 NOTE — ED Provider Notes (Signed)
Emergency Department Provider Note   I have reviewed the triage vital signs and the nursing notes.   HISTORY  Chief Complaint Chest Pain   HPI Margaret Hale is a 84 y.o. female with multiple medical problems as documented below who presents to the emergency department today secondary to left-sided chest pain.  Patient states it seems like it radiates up from her left side abdomen.  Gets better with Tylenol.  She has it 1 or 2 times a day.  Is been going on for couple weeks.  No other associated symptoms.  No nausea, vomiting, dyspnea, diaphoresis or lightheadedness.   No other associated or modifying symptoms.    Past Medical History:  Diagnosis Date   Adenomatous polyp 02/2009 &02/2012   Anxiety    Baker's cyst    L KNEE   Bulging lumbar disc 1990   CAD (coronary artery disease) 2007   PCI of RCA, bare metal stent.....Dr. Radford Pax   Cancer [facility])    lung mass   Disc disorder of lumbar region    2 BULDGING DISCS DX 1990 BY DR. DALLDORF   Dizzy    Hx of radiation therapy 2018   [place] [facility]   Hyperlipidemia    Hypertension    Takes Novasc ,Clonidine, & Losartan   Lipoma of back    Menopausal flushing    Multiple thyroid nodules    No pertinent past medical history    lung mass  occas cough    Osteoporosis    Positive test for herpes simplex virus (HSV) antibody    Sinusitis, chronic    Spondylosis    NECK   Thyroid disease    nodules,stable on u/s 2/12   Vitamin D deficiency     Patient Active Problem List   Diagnosis Date Noted   Port-A-Cath in place 07/15/2018   UTI (urinary tract infection) 11/19/2015   Port catheter in place 09/16/2015   Small cell carcinoma of right lung ([place]) 08/03/2015   Brain metastasis ([place]) 07/14/2015   S/P craniotomy 06/09/2015   Benign essential HTN 02/11/2014   Dyslipidemia 02/11/2014   Chest pain 02/11/2014   Restless leg syndrome 02/11/2014   COPD (chronic obstructive pulmonary disease)  ([place]) 09/17/2011   Pulmonary nodules 09/13/2011   CAD (coronary artery disease)    Thyroid disease    Baker's cyst    Vitamin D deficiency    Disc disorder of lumbar region     Past Surgical History:  Procedure Laterality Date   CARPAL TUNNEL RELEASE  06/26/08        04/20/09   RIGHT HAND          LEFT HAND   CORONARY ANGIOPLASTY     dr h Tamala Julian [phone] with one stent   CRANIOTOMY Right 06/09/2015   Procedure: CRANIOTOMY TUMOR EXCISION WITH APPLICATION OF CRANIAL NAVIGATION;  Surgeon: Newman Pies, MD;  Location: [place];  Service: Neurosurgery;  Laterality: Right;  Right temporal craniotomy for resection of tumor   EYE SURGERY  01/07/09   LEFT CATARACT   EYE SURGERY  01/21/09   RIGHT CATARACT   FINGER CONTRACTURE RELEASE  8/12    Dr. Amedeo Plenty   right hand trigger finger release   MEDIASTINOSCOPY  08/02/2011   Procedure: MEDIASTINOSCOPY;  Surgeon: Nicanor Alcon, MD;  Location: [place];  Service: Thoracic;  Laterality: N/A;   right power port placement Right     Current Outpatient Rx   Order #: [id number] Class: Historical Med   Order #:  [id number] Class: Historical Med   Order #: [id number] Class: Historical Med   Order #: [id number] Class: Print   Order #: [id number] Class: Historical Med   Order #: [id number] Class: Historical Med   Order #: [id number] Class: Historical Med   Order #: [id number] Class: Normal   Order #: [id number] Class: Normal   Order #: [id number] Class: Historical Med   Order #: [id number] Class: Normal   Order #: [id number] Class: Normal    Allergies Food, Lipitor [atorvastatin calcium], Tape, and Pantoprazole sodium  Family History  Problem Relation Age of Onset   Healthy Son    Healthy Son    Anesthesia problems Neg Hx    Hypotension Neg Hx    Malignant hyperthermia Neg Hx    Pseudochol deficiency Neg Hx     Social History Social History   Tobacco Use   Smoking status: Former Smoker    Packs/day: 0.50    Years: 10.00     Pack years: 5.00    Types: Cigarettes    Quit date: 07/12/2005    Years since quitting: 13.6   Smokeless tobacco: Never Used  Substance Use Topics   Alcohol use: No   Drug use: No    Review of Systems  All other systems negative except as documented in the HPI. All pertinent positives and negatives as reviewed in the HPI. ____________________________________________   PHYSICAL EXAM:  VITAL SIGNS: ED Triage Vitals  Enc Vitals Group     BP 02/20/19 1728 138/88     Pulse Rate 02/20/19 1728 90     Resp 02/20/19 1728 18     Temp 02/20/19 1728 99.3 F (37.4 C)     Temp src --     Constitutional: Alert and oriented. Well appearing and in no acute distress. Eyes: Conjunctivae are normal. PERRL. EOMI. Head: Atraumatic. Nose: No congestion/rhinnorhea. Mouth/Throat: Mucous membranes are moist.  Oropharynx non-erythematous. Neck: No stridor.  No meningeal signs.   Cardiovascular: Normal rate, regular rhythm. Good peripheral circulation. Grossly normal heart sounds.   Respiratory: Normal respiratory effort.  No retractions. Lungs CTAB. Gastrointestinal: Soft and nontender. No distention.  Musculoskeletal: No lower extremity tenderness nor edema. No gross deformities of extremities. Neurologic:  Normal speech and language. No gross focal neurologic deficits are appreciated.  Skin:  Skin is warm, dry and intact. No rash noted.   ____________________________________________   LABS (all labs ordered are listed, but only abnormal results are displayed)  Labs Reviewed  BASIC METABOLIC PANEL - Abnormal; Notable for the following components:      Result Value   Glucose, Bld 133 (*)    BUN 36 (*)    All other components within normal limits  CBC  HEPATIC FUNCTION PANEL  TROPONIN I (HIGH SENSITIVITY)  TROPONIN I (HIGH SENSITIVITY)   ____________________________________________  EKG   EKG Interpretation  Date/Time:  Thursday February 20 2019 17:30:51 EDT Ventricular Rate:   60 PR Interval:    QRS Duration: 94 QT Interval:  461 QTC Calculation: 461 R Axis:   64 Text Interpretation:  Sinus rhythm No significant change since last tracing Confirmed by Merrily Pew [phone]) on 02/20/2019 6:20:53 PM       ____________________________________________  RADIOLOGY  Dg Chest 2 View  Result Date: 02/20/2019 CLINICAL DATA:  Chest pain since 1 p.m. today.  Dizziness. EXAM: CHEST - 2 VIEW COMPARISON:  Chest CT, 11/19/2018 FINDINGS: Cardiac silhouette is mildly enlarged. No mediastinal or hilar masses. Lungs are clear.  No pleural effusion or pneumothorax. Right anterior chest wall, internal jugular, Port-A-Cath is stable. Skeletal  structures are intact. IMPRESSION: No acute cardiopulmonary disease. Electronically Signed   By: Lajean Manes M.D.   On: 02/20/2019 18:16   Ct Angio Chest Aorta W And/or Wo Contrast  Result Date: 02/20/2019 CLINICAL DATA:  84 year old female with chest pain and dizziness. EXAM: CT ANGIOGRAPHY CHEST WITH CONTRAST TECHNIQUE: Multidetector CT imaging of the chest was performed using the standard protocol during bolus administration of intravenous contrast. Multiplanar CT image reconstructions and MIPs were obtained to evaluate the vascular anatomy. CONTRAST:  135mL OMNIPAQUE IOHEXOL 350 MG/ML SOLN COMPARISON:  Chest CT dated 11/19/2018 FINDINGS: Cardiovascular: There is no cardiomegaly or pericardial effusion. There is multi vessel coronary vascular calcification as well as calcification of the mitral annulus. Advanced atherosclerotic calcification of the thoracic aorta. There is dilatation of the aortic isthmus measuring up to 3.5 cm. The right vertebral artery is not visualized. The remainder of the visualized origins of the great vessels of the aortic arch appear patent. There is no CT evidence of pulmonary embolism. Mediastinum/Nodes: No hilar or mediastinal adenopathy. Esophagus is grossly unremarkable. Multiple right thyroid hypodense nodules  measure up to 2 cm. Ultrasound may provide better evaluation on a nonemergent basis. Right-sided Port-A-Cath with tip in the region of the cavoatrial junction. Lungs/Pleura: There is an 8 mm nodule in the right middle lobe. A 5 mm nodule is also noted in the right middle lobe. These nodules are essentially unchanged since the prior CT. A 9 mm nodule in the posterior right upper lobe (series 6, image 34) appears smaller than prior CT. No new nodules. There is no focal consolidation, pleural effusion, or pneumothorax. The central airways are patent. Upper Abdomen: No acute abnormality. Musculoskeletal: Degenerative changes of the spine. No acute osseous pathology. Review of the MIP images confirms the above findings. IMPRESSION: 1. No acute intrathoracic pathology. No CT evidence of pulmonary embolism. 2. Right lung nodules right relief similar to prior CT. A nodule in the posterior right upper lobe appears slightly smaller. No new nodules. 3. Multiple right thyroid hypodense nodules. Ultrasound may provide better evaluation on a nonemergent basis. Aortic Atherosclerosis (ICD10-I70.0). Electronically Signed   By: Anner Crete M.D.   On: 02/20/2019 20:59    ____________________________________________   PROCEDURES  Procedure(s) performed:   Procedures   ____________________________________________   INITIAL IMPRESSION / ASSESSMENT AND PLAN / ED COURSE  ED with a history of ACS status post stent the other unlikely to be cardiac related.  She does have a history of cancer metastatic to her brain.  One possibility is pulmonary embolus versus less likely pericarditis, myocarditis or pneumonia.  X-ray negative.  Labs unremarkable.  we will plan to get another troponin and CT dissection study to evaluate for pulmonary abnormalities, dissection, PE.  Workup negative. Not likely emergent causes based on workup.   Pertinent labs & imaging results that were available during my care of the patient were  reviewed by me and considered in my medical decision making (see chart for details).  A medical screening exam was performed and I feel the patient has had an appropriate workup for their chief complaint at this time and likelihood of emergent condition existing is low. They have been counseled on decision, discharge, follow up and which symptoms necessitate immediate return to the emergency department. They or their family verbally stated understanding and agreement with plan and discharged in stable condition.   ____________________________________________  FINAL CLINICAL IMPRESSION(S) / ED DIAGNOSES  Final diagnoses:  Nonspecific chest pain     MEDICATIONS GIVEN  DURING THIS VISIT:  Medications  sodium chloride flush (NS) 0.9 % injection 3 mL (3 mLs Intravenous Not Given 02/20/19 2031)  iohexol (OMNIPAQUE) 350 MG/ML injection 100 mL (100 mLs Intravenous Contrast Given 02/20/19 2032)     NEW OUTPATIENT MEDICATIONS STARTED DURING THIS VISIT:  Discharge Medication List as of 02/20/2019  9:08 PM      Note:  This note was prepared with assistance of Dragon voice recognition software. Occasional wrong-word or sound-a-like substitutions may have occurred due to the inherent limitations of voice recognition software.   Serafin Decatur, Corene Cornea, MD 02/21/19 [phone]

## 2019-02-20 NOTE — ED Notes (Signed)
Sectary to call [facility] for transport

## 2019-02-20 NOTE — ED Notes (Signed)
Called Son per pt. Request. Will continue to update.   Pt continue to denie CP at this time.

## 2019-02-20 NOTE — ED Notes (Signed)
Discussed discharge instructions with [facility]. Informed RN was unable to discussed discharge instructions with receiving nursing facility.

## 2019-02-24 ENCOUNTER — Telehealth: Payer: Self-pay | Admitting: Oncology

## 2019-02-24 NOTE — Telephone Encounter (Signed)
Scheduled appt per 9/25 sch message - unable to reach pt - and unable to leave message. Mailed letter with appt date and time

## 2019-02-28 NOTE — Progress Notes (Signed)
CARDIOLOGY OFFICE NOTE  Date:  03/04/2019    Margaret Hale Date of Birth: 18-Jan-1935 Medical Record #[id number]  PCP:  [name], MD  Cardiologist:  [facility]   Chief Complaint  Patient presents with  . Follow-up    History of Present Illness: Margaret Hale is a 84 y.o. female who presents today for a work in visit. Seen for Dr. Marlou Porch.   She has a hx of coronary disease status post PCI of RCA with bare-metal stent 2007, HTN, HLD and small cell lung cancer.   Last seen here in February of 2019 - no cardiac issues noted at that time.   The patient does have symptoms concerning for COVID-19 infection (fever, chills, cough, or new shortness of breath).   In the ER last month - having chest pain - left sided that radiated up from her left abdomen - better with Tylenol - happens 1 to 2 times a day and has been going on for several weeks. CXR was done and was ok. She had a CTA of the chest - had right lung nodules - nothing new noted and multiple right thyroid nodules - ultrasound recommended.   Comes in today. Here alone. Apparently was dropped off downstairs. It is very difficult to understand her.  She complained of getting dizzy after getting here and felt "confused". BP is low. Unclear as to why. lShe has had a chronic cough - very congested sounding. She tells me she does not feel good. Feels like she will faint. Rather pale. She continues to have this pain on the left side of her body - one to two times a day - gets Tylenol with relief.  She keeps telling me she wants to lie her head down.  She is not able to walk.   Past Medical History:  Diagnosis Date  . Adenomatous polyp 02/2009 &02/2012  . Anxiety   . Baker's cyst    L KNEE  . Bulging lumbar disc 1990  . CAD (coronary artery disease) 2007   PCI of RCA, bare metal stent.....Dr. Radford Pax  . Cancer ([facility])    lung mass  . Disc disorder of lumbar region    2 BULDGING DISCS DX 1990 BY DR. DALLDORF  . Dizzy   .  Hx of radiation therapy 2018   [place] [facility]  . Hyperlipidemia   . Hypertension    Takes Novasc ,Clonidine, & Losartan  . Lipoma of back   . Menopausal flushing   . Multiple thyroid nodules   . No pertinent past medical history    lung mass  occas cough   . Osteoporosis   . Positive test for herpes simplex virus (HSV) antibody   . Sinusitis, chronic   . Spondylosis    NECK  . Thyroid disease    nodules,stable on u/s 2/12  . Vitamin D deficiency     Past Surgical History:  Procedure Laterality Date  . CARPAL TUNNEL RELEASE  06/26/08        04/20/09   RIGHT HAND          LEFT HAND  . CORONARY ANGIOPLASTY     dr h Tamala Julian [phone] with one stent  . CRANIOTOMY Right 06/09/2015   Procedure: CRANIOTOMY TUMOR EXCISION WITH APPLICATION OF CRANIAL NAVIGATION;  Surgeon: Newman Pies, MD;  Location: [place];  Service: Neurosurgery;  Laterality: Right;  Right temporal craniotomy for resection of tumor  . EYE SURGERY  01/07/09   LEFT CATARACT  . EYE  SURGERY  01/21/09   RIGHT CATARACT  . FINGER CONTRACTURE RELEASE  8/12    Dr. Amedeo Plenty   right hand trigger finger release  . MEDIASTINOSCOPY  08/02/2011   Procedure: MEDIASTINOSCOPY;  Surgeon: Nicanor Alcon, MD;  Location: [facility];  Service: Thoracic;  Laterality: N/A;  . right power port placement Right      Medications: Current Meds  Medication Sig  . amLODipine (NORVASC) 10 MG tablet Take 10 mg by mouth every evening.   . Cholecalciferol (VITAMIN D) 2000 units CAPS Take by mouth.  . labetalol (NORMODYNE) 200 MG tablet Take 200 mg by mouth 2 (two) times daily.  Marland Kitchen lidocaine (XYLOCAINE) 5 % ointment Apply 1 application topically 3 (three) times daily as needed.  . NON FORMULARY Inject into the vein 2 (two) times daily. IV Vitamin C infusion twice weekly via PAC  . olmesartan-hydrochlorothiazide (BENICAR HCT) 40-25 MG tablet Take 1 tablet by mouth daily.  . Omega-3 Fatty Acids (FISH OIL PO) Take 4,000 Units by mouth 2 (two) times daily.  .  potassium chloride (K-DUR) 10 MEQ tablet Take 1 tablet (10 mEq total) by mouth daily.  Marland Kitchen rOPINIRole (REQUIP) 1 MG tablet Take 1 mg by mouth at bedtime.  . valsartan (DIOVAN) 320 MG tablet Take 1 tablet (320 mg total) by mouth daily.  . vitamin E (VITAMIN E) 400 UNIT capsule Take 1 capsule (400 Units total) by mouth 2 (two) times daily.     Allergies: Allergies  Allergen Reactions  . Food     Sausage, hot dogs, salami (rash)  . Lipitor [Atorvastatin Calcium] Other (See Comments)    MYALGIAS  . Tape Other (See Comments)  . Pantoprazole Sodium Rash    Social History: The patient  reports that she quit smoking about 13 years ago. Her smoking use included cigarettes. She has a 5.00 pack-year smoking history. She has never used smokeless tobacco. She reports that she does not drink alcohol or use drugs.   Family History: The patient's family history includes Healthy in her son and son.   Review of Systems: Please see the history of present illness.   All other systems are reviewed and negative.   Physical Exam: VS:  BP (!) 80/42   Pulse (!) 56   Ht 5\' 5"  (1.651 m)   SpO2 97%   BMI 28.29 kg/m  .  BMI Body mass index is 28.29 kg/m.  Wt Readings from Last 3 Encounters:  02/20/19 170 lb (77.1 kg)  09/06/18 175 lb (79.4 kg)  07/15/18 182 lb (82.6 kg)    General: She looks rather ill -  in no acute distress but lethargic. Color is poor. BP is low - 80/50 by me. Feels warm to touch but is afebrile.  She is quite hard to understand.  HEENT: Normal.  Neck: Supple, no JVD, carotid bruits, or masses noted.  Cardiac: Heart tones are distant. She has increased intermittently of work of breathing.  GI: Soft and nontender.  MS: No deformity or atrophy. Gait not tested. She is not Skin: Warm and dry. Color is normal.  Neuro:  Strength and sensation are intact and no gross focal deficits noted.  Psych: Alert, appropriate and with normal affect.   LABORATORY DATA:  EKG:  EKG is ordered  today. This demonstrates sinus brady.  Lab Results  Component Value Date   WBC 8.0 02/20/2019   HGB 13.8 02/20/2019   HCT 43.2 02/20/2019   PLT 207 02/20/2019   GLUCOSE 133 (H)  02/20/2019   CHOL 226 (H) 07/24/2017   TRIG 66 07/24/2017   HDL 79 07/24/2017   LDLCALC 134 (H) 07/24/2017   ALT 32 02/20/2019   AST 23 02/20/2019   NA 141 02/20/2019   K 4.2 02/20/2019   CL 105 02/20/2019   CREATININE 0.82 02/20/2019   BUN 36 (H) 02/20/2019   CO2 24 02/20/2019   INR 0.94 10/26/2015     BNP (last 3 results) No results for input(s): BNP in the last 8760 hours.  ProBNP (last 3 results) No results for input(s): PROBNP in the last 8760 hours.   Other Studies Reviewed Today:  Echo Study Conclusions 04/2015  - Left ventricle: The cavity size was normal. Wall thickness was   increased in a pattern of mild LVH. There was mild focal basal   hypertrophy of the septum. Systolic function was normal. The   estimated ejection fraction was in the range of 55% to 60%. Wall   motion was normal; there were no regional wall motion   abnormalities. Doppler parameters are consistent with abnormal   left ventricular relaxation (grade 1 diastolic dysfunction).   Doppler parameters are consistent with high ventricular filling   pressure. - Mitral valve: Calcified annulus. Mildly thickened leaflets . - Left atrium: The atrium was mildly dilated. - Pulmonary arteries: Systolic pressure was mildly increased. PA   peak pressure: 34 mm Hg (S).  Assessment/Plan:  1. Cough/Hypotension/Legathary - could have COVID - she is not really able to give a history - sending to the ER per EMS for further evaluation.   2. Chest pain - recent ER visit - did not seem cardiac.  3. Known CAD with remote PCI - last stress test in 2015 - she has been managed medically.   4. HLD - some statin intolerance - not discussed today.   5. Small cell lung cancer with prior mets to the brain - unclear to me as to the  status of this.   6. COVID-19 Education: The signs and symptoms of COVID-19 were discussed with the patient and how to seek care for testing (follow up with PCP or arrange E-visit).  The importance of social distancing, staying at home, hand hygiene and wearing a mask when out in public were discussed today.  Current medicines are reviewed with the patient today.  The patient does not have concerns regarding medicines other than what has been noted above.  The following changes have been made:  See above.  Labs/ tests ordered today include:   No orders of the defined types were placed in this encounter.   Disposition:   Sending to the ER per EMS. Her son was called and given an update and informed of her being sent to the ED for further evaluation.   Patient is agreeable to this plan and will call if any problems develop in the interim.   SignedTruitt Merle, NP  03/04/2019 10:22 AM  [facility] [address] [place], [place]  [address] Phone: [phone] Fax: [phone]

## 2019-03-04 ENCOUNTER — Emergency Department (HOSPITAL_COMMUNITY): Payer: Medicare Other

## 2019-03-04 ENCOUNTER — Observation Stay (HOSPITAL_COMMUNITY)
Admission: EM | Admit: 2019-03-04 | Discharge: 2019-03-05 | Disposition: A | Discharge status: Skilled Nursing Facility | Payer: Medicare Other | Attending: Internal Medicine | Admitting: Internal Medicine

## 2019-03-04 ENCOUNTER — Encounter (HOSPITAL_COMMUNITY): Payer: Self-pay | Admitting: Emergency Medicine

## 2019-03-04 ENCOUNTER — Ambulatory Visit: Payer: Medicare Other | Admitting: Nurse Practitioner

## 2019-03-04 ENCOUNTER — Other Ambulatory Visit: Payer: Self-pay

## 2019-03-04 ENCOUNTER — Encounter: Payer: Self-pay | Admitting: Nurse Practitioner

## 2019-03-04 VITALS — BP 80/42 | HR 56 | Ht 65.0 in

## 2019-03-04 DIAGNOSIS — J449 Chronic obstructive pulmonary disease, unspecified: Secondary | ICD-10-CM | POA: Insufficient documentation

## 2019-03-04 DIAGNOSIS — I9589 Other hypotension: Secondary | ICD-10-CM | POA: Diagnosis not present

## 2019-03-04 DIAGNOSIS — Z20828 Contact with and (suspected) exposure to other viral communicable diseases: Secondary | ICD-10-CM | POA: Diagnosis not present

## 2019-03-04 DIAGNOSIS — Z955 Presence of coronary angioplasty implant and graft: Secondary | ICD-10-CM | POA: Insufficient documentation

## 2019-03-04 DIAGNOSIS — E860 Dehydration: Secondary | ICD-10-CM | POA: Diagnosis present

## 2019-03-04 DIAGNOSIS — Z66 Do not resuscitate: Secondary | ICD-10-CM | POA: Insufficient documentation

## 2019-03-04 DIAGNOSIS — Z87891 Personal history of nicotine dependence: Secondary | ICD-10-CM | POA: Diagnosis not present

## 2019-03-04 DIAGNOSIS — F419 Anxiety disorder, unspecified: Secondary | ICD-10-CM | POA: Insufficient documentation

## 2019-03-04 DIAGNOSIS — R531 Weakness: Principal | ICD-10-CM | POA: Insufficient documentation

## 2019-03-04 DIAGNOSIS — Z79899 Other long term (current) drug therapy: Secondary | ICD-10-CM | POA: Diagnosis not present

## 2019-03-04 DIAGNOSIS — I10 Essential (primary) hypertension: Secondary | ICD-10-CM | POA: Insufficient documentation

## 2019-03-04 DIAGNOSIS — I2510 Atherosclerotic heart disease of native coronary artery without angina pectoris: Secondary | ICD-10-CM

## 2019-03-04 DIAGNOSIS — I959 Hypotension, unspecified: Secondary | ICD-10-CM | POA: Diagnosis not present

## 2019-03-04 DIAGNOSIS — R05 Cough: Secondary | ICD-10-CM

## 2019-03-04 DIAGNOSIS — J441 Chronic obstructive pulmonary disease with (acute) exacerbation: Secondary | ICD-10-CM | POA: Diagnosis present

## 2019-03-04 DIAGNOSIS — C3491 Malignant neoplasm of unspecified part of right bronchus or lung: Secondary | ICD-10-CM | POA: Diagnosis not present

## 2019-03-04 DIAGNOSIS — E876 Hypokalemia: Secondary | ICD-10-CM | POA: Insufficient documentation

## 2019-03-04 DIAGNOSIS — Z888 Allergy status to other drugs, medicaments and biological substances status: Secondary | ICD-10-CM | POA: Insufficient documentation

## 2019-03-04 DIAGNOSIS — E785 Hyperlipidemia, unspecified: Secondary | ICD-10-CM | POA: Diagnosis not present

## 2019-03-04 DIAGNOSIS — R001 Bradycardia, unspecified: Secondary | ICD-10-CM | POA: Diagnosis not present

## 2019-03-04 DIAGNOSIS — Z9221 Personal history of antineoplastic chemotherapy: Secondary | ICD-10-CM | POA: Diagnosis not present

## 2019-03-04 DIAGNOSIS — G2581 Restless legs syndrome: Secondary | ICD-10-CM | POA: Insufficient documentation

## 2019-03-04 DIAGNOSIS — Z923 Personal history of irradiation: Secondary | ICD-10-CM | POA: Diagnosis not present

## 2019-03-04 DIAGNOSIS — R059 Cough, unspecified: Secondary | ICD-10-CM

## 2019-03-04 DIAGNOSIS — R079 Chest pain, unspecified: Secondary | ICD-10-CM

## 2019-03-04 DIAGNOSIS — F329 Major depressive disorder, single episode, unspecified: Secondary | ICD-10-CM | POA: Diagnosis not present

## 2019-03-04 DIAGNOSIS — C7931 Secondary malignant neoplasm of brain: Secondary | ICD-10-CM | POA: Insufficient documentation

## 2019-03-04 DIAGNOSIS — E042 Nontoxic multinodular goiter: Secondary | ICD-10-CM | POA: Diagnosis not present

## 2019-03-04 DIAGNOSIS — Z95828 Presence of other vascular implants and grafts: Secondary | ICD-10-CM

## 2019-03-04 LAB — CBC WITH DIFFERENTIAL/PLATELET
Abs Immature Granulocytes: 0.07 10*3/uL (ref 0.00–0.07)
Basophils Absolute: 0 10*3/uL (ref 0.0–0.1)
Basophils Relative: 0 %
Eosinophils Absolute: 0.1 10*3/uL (ref 0.0–0.5)
Eosinophils Relative: 2 %
HCT: 39.1 % (ref 36.0–46.0)
Hemoglobin: 12.9 g/dL (ref 12.0–15.0)
Immature Granulocytes: 1 %
Lymphocytes Relative: 12 %
Lymphs Abs: 1 10*3/uL (ref 0.7–4.0)
MCH: 30.6 pg (ref 26.0–34.0)
MCHC: 33 g/dL (ref 30.0–36.0)
MCV: 92.9 fL (ref 80.0–100.0)
Monocytes Absolute: 0.7 10*3/uL (ref 0.1–1.0)
Monocytes Relative: 8 %
Neutro Abs: 6.1 10*3/uL (ref 1.7–7.7)
Neutrophils Relative %: 77 %
Platelets: 162 10*3/uL (ref 150–400)
RBC: 4.21 MIL/uL (ref 3.87–5.11)
RDW: 15.5 % (ref 11.5–15.5)
WBC: 8 10*3/uL (ref 4.0–10.5)
nRBC: 0 % (ref 0.0–0.2)

## 2019-03-04 LAB — BASIC METABOLIC PANEL
Anion gap: 12 (ref 5–15)
BUN: 24 mg/dL — ABNORMAL HIGH (ref 8–23)
CO2: 24 mmol/L (ref 22–32)
Calcium: 9 mg/dL (ref 8.9–10.3)
Chloride: 106 mmol/L (ref 98–111)
Creatinine, Ser: 0.78 mg/dL (ref 0.44–1.00)
GFR calc Af Amer: >60 mL/min (ref >60)
GFR calc non Af Amer: >60 mL/min (ref >60)
Glucose, Bld: 109 mg/dL — ABNORMAL HIGH (ref 70–99)
Potassium: 3.3 mmol/L — ABNORMAL LOW (ref 3.5–5.1)
Sodium: 142 mmol/L (ref 135–145)

## 2019-03-04 LAB — POCT I-STAT 7, (LYTES, BLD GAS, ICA,H+H)
Acid-Base Excess: 2 mmol/L (ref 0.0–2.0)
Bicarbonate: 27.1 mmol/L (ref 20.0–28.0)
Calcium, Ion: 1.28 mmol/L (ref 1.15–1.40)
HCT: 36 % (ref 36.0–46.0)
Hemoglobin: 12.2 g/dL (ref 12.0–15.0)
O2 Saturation: 98 %
Patient temperature: 99.1
Potassium: 3.3 mmol/L — ABNORMAL LOW (ref 3.5–5.1)
Sodium: 142 mmol/L (ref 135–145)
TCO2: 28 mmol/L (ref 22–32)
pCO2 arterial: 44.5 mmHg (ref 32.0–48.0)
pH, Arterial: 7.393 (ref 7.350–7.450)
pO2, Arterial: 109 mmHg — ABNORMAL HIGH (ref 83.0–108.0)

## 2019-03-04 LAB — CBC
HCT: 40.1 % (ref 36.0–46.0)
Hemoglobin: 13 g/dL (ref 12.0–15.0)
MCH: 30 pg (ref 26.0–34.0)
MCHC: 32.4 g/dL (ref 30.0–36.0)
MCV: 92.6 fL (ref 80.0–100.0)
Platelets: 177 10*3/uL (ref 150–400)
RBC: 4.33 MIL/uL (ref 3.87–5.11)
RDW: 15.3 % (ref 11.5–15.5)
WBC: 8.2 10*3/uL (ref 4.0–10.5)
nRBC: 0 % (ref 0.0–0.2)

## 2019-03-04 LAB — SARS CORONAVIRUS 2 BY RT PCR (HOSPITAL ORDER, PERFORMED IN ~~LOC~~ HOSPITAL LAB): SARS Coronavirus 2: NEGATIVE

## 2019-03-04 LAB — URINALYSIS, ROUTINE W REFLEX MICROSCOPIC
Bilirubin Urine: NEGATIVE
Glucose, UA: NEGATIVE mg/dL
Hgb urine dipstick: NEGATIVE
Ketones, ur: NEGATIVE mg/dL
Nitrite: NEGATIVE
Protein, ur: NEGATIVE mg/dL
Specific Gravity, Urine: 1.024 (ref 1.005–1.030)
pH: 5 (ref 5.0–8.0)

## 2019-03-04 LAB — PHOSPHORUS: Phosphorus: 4.5 mg/dL (ref 2.5–4.6)

## 2019-03-04 LAB — TROPONIN I (HIGH SENSITIVITY)
Troponin I (High Sensitivity): 22 ng/L — ABNORMAL HIGH (ref <18)
Troponin I (High Sensitivity): 18 ng/L — ABNORMAL HIGH (ref <18)

## 2019-03-04 LAB — CREATININE, SERUM
Creatinine, Ser: 0.72 mg/dL (ref 0.44–1.00)
GFR calc Af Amer: >60 mL/min (ref >60)
GFR calc non Af Amer: >60 mL/min (ref >60)

## 2019-03-04 LAB — LACTIC ACID, PLASMA: Lactic Acid, Venous: 1.6 mmol/L (ref 0.5–1.9)

## 2019-03-04 LAB — TSH: TSH: 1.05 u[IU]/mL (ref 0.350–4.500)

## 2019-03-04 LAB — MAGNESIUM: Magnesium: 2.1 mg/dL (ref 1.7–2.4)

## 2019-03-04 LAB — BRAIN NATRIURETIC PEPTIDE: B Natriuretic Peptide: 193.2 pg/mL — ABNORMAL HIGH (ref 0.0–100.0)

## 2019-03-04 MED ORDER — ROPINIROLE HCL 1 MG PO TABS
2.0000 mg | ORAL_TABLET | Freq: Every day | ORAL | Status: DC
  Administered 2019-03-04: 23:00:00 2 mg via ORAL
  Filled 2019-03-04: qty 2

## 2019-03-04 MED ORDER — FAMOTIDINE 20 MG PO TABS
20.0000 mg | ORAL_TABLET | Freq: Two times a day (BID) | ORAL | Status: DC
  Administered 2019-03-05: 10:00:00 20 mg via ORAL
  Filled 2019-03-04 (×2): qty 1

## 2019-03-04 MED ORDER — NITROGLYCERIN 0.4 MG SL SUBL: 0.4000 mg | SUBLINGUAL_TABLET | SUBLINGUAL | Status: DC | PRN

## 2019-03-04 MED ORDER — ACETAMINOPHEN 650 MG RE SUPP: 650.0000 mg | Freq: Four times a day (QID) | RECTAL | Status: DC | PRN

## 2019-03-04 MED ORDER — ENOXAPARIN SODIUM 40 MG/0.4ML ~~LOC~~ SOLN
40.0000 mg | Freq: Every day | SUBCUTANEOUS | Status: DC
  Administered 2019-03-04: 23:00:00 40 mg via SUBCUTANEOUS
  Filled 2019-03-04: qty 0.4

## 2019-03-04 MED ORDER — ONDANSETRON HCL 4 MG/2ML IJ SOLN: 4.0000 mg | Freq: Four times a day (QID) | INTRAMUSCULAR | Status: DC | PRN

## 2019-03-04 MED ORDER — POTASSIUM CHLORIDE IN NACL 20-0.9 MEQ/L-% IV SOLN
INTRAVENOUS | Status: DC
  Administered 2019-03-04 – 2019-03-05 (×2): via INTRAVENOUS
  Filled 2019-03-04 (×3): qty 1000

## 2019-03-04 MED ORDER — SODIUM CHLORIDE 0.9 % IV BOLUS (SEPSIS)
1000.0000 mL | Freq: Once | INTRAVENOUS | Status: AC
  Administered 2019-03-04: 12:00:00 1000 mL via INTRAVENOUS

## 2019-03-04 MED ORDER — DEXAMETHASONE 4 MG PO TABS
2.0000 mg | ORAL_TABLET | Freq: Every day | ORAL | Status: DC
  Administered 2019-03-05: 10:00:00 2 mg via ORAL
  Filled 2019-03-04: qty 1

## 2019-03-04 MED ORDER — CHLORHEXIDINE GLUCONATE CLOTH 2 % EX PADS
6.0000 | MEDICATED_PAD | Freq: Every day | CUTANEOUS | Status: DC
  Administered 2019-03-05: 04:00:00 6 via TOPICAL

## 2019-03-04 MED ORDER — ACETAMINOPHEN 325 MG PO TABS
650.0000 mg | ORAL_TABLET | Freq: Four times a day (QID) | ORAL | Status: DC | PRN
  Administered 2019-03-05 (×2): 650 mg via ORAL
  Filled 2019-03-04 (×2): qty 2

## 2019-03-04 MED ORDER — METHYLPREDNISOLONE SODIUM SUCC 125 MG IJ SOLR
125.0000 mg | Freq: Once | INTRAMUSCULAR | Status: AC
  Administered 2019-03-04: 15:00:00 125 mg via INTRAVENOUS
  Filled 2019-03-04: qty 2

## 2019-03-04 MED ORDER — IPRATROPIUM-ALBUTEROL 0.5-2.5 (3) MG/3ML IN SOLN
3.0000 mL | RESPIRATORY_TRACT | Status: AC
  Administered 2019-03-04: 3 mL via RESPIRATORY_TRACT
  Filled 2019-03-04: qty 3
  Filled 2019-03-04: qty 6

## 2019-03-04 MED ORDER — SERTRALINE HCL 50 MG PO TABS
50.0000 mg | ORAL_TABLET | Freq: Every day | ORAL | Status: DC
  Administered 2019-03-05: 50 mg via ORAL
  Filled 2019-03-04: qty 1

## 2019-03-04 MED ORDER — ONDANSETRON HCL 4 MG PO TABS: 4.0000 mg | ORAL_TABLET | Freq: Four times a day (QID) | ORAL | Status: DC | PRN

## 2019-03-04 MED ORDER — SODIUM CHLORIDE 0.9% FLUSH: 10.0000 mL | INTRAVENOUS | Status: DC | PRN

## 2019-03-04 NOTE — ED Provider Notes (Signed)
[facility] EMERGENCY DEPARTMENT Provider Note   CSN: [id number] Arrival date & time: 03/04/19  1117     History   Chief Complaint Chief Complaint  Patient presents with  . Shortness of Breath  . Cough    HPI Margaret Hale is a 84 y.o. female.     Patient is an 84 year old female coming from nursing home with multiple past medical problems inclusive of COPD, coronary artery disease, metastatic cancer who presents to the emergency department for lethargy, cough and COVID-like symptoms.  Patient was seen in the emergency department on 24 September for left-sided chest and abdominal pain thought to not be cardiac related.  She had a follow-up with the cardiologist today.  Patient was dropped off by nursing staff and cardiologist notes state the patient was very fatigued, lethargic, hypotensive.  They had a concern for COVID or other emergent problem so she was brought to the emergency department via EMS.  Patient reports that she has just been feeling terrible for the last month.  Reports that twice a day she still gets left-sided chest pain and abdominal pain.  Denies any fever, chills, nausea, vomiting.  She does note that she has had a nonproductive cough without shortness of breath.  Patient does appear lethargic on exam.     Past Medical History:  Diagnosis Date  . Adenomatous polyp 02/2009 &02/2012  . Anxiety   . Baker's cyst    L KNEE  . Bulging lumbar disc 1990  . CAD (coronary artery disease) 2007   PCI of RCA, bare metal stent.....Dr. Radford Pax  . Cancer (HCC)    lung mass  . Disc disorder of lumbar region    2 BULDGING DISCS DX 1990 BY DR. DALLDORF  . Dizzy   . Hx of radiation therapy 2018   [facility]  . Hyperlipidemia   . Hypertension    Takes Novasc ,Clonidine, & Losartan  . Lipoma of back   . Menopausal flushing   . Multiple thyroid nodules   . No pertinent past medical history    lung mass  occas cough   . Osteoporosis   . Positive  test for herpes simplex virus (HSV) antibody   . Sinusitis, chronic   . Spondylosis    NECK  . Thyroid disease    nodules,stable on u/s 2/12  . Vitamin D deficiency     Patient Active Problem List   Diagnosis Date Noted  . Port-A-Cath in place 07/15/2018  . UTI (urinary tract infection) 11/19/2015  . Port catheter in place 09/16/2015  . Small cell carcinoma of right lung ([place]) 08/03/2015  . Brain metastasis ([place]) 07/14/2015  . S/P craniotomy 06/09/2015  . Benign essential HTN 02/11/2014  . Dyslipidemia 02/11/2014  . Chest pain 02/11/2014  . Restless leg syndrome 02/11/2014  . COPD (chronic obstructive pulmonary disease) ([place]) 09/17/2011  . Pulmonary nodules 09/13/2011  . CAD (coronary artery disease)   . Thyroid disease   . Baker's cyst   . Vitamin D deficiency   . Disc disorder of lumbar region     Past Surgical History:  Procedure Laterality Date  . CARPAL TUNNEL RELEASE  06/26/08        04/20/09   RIGHT HAND          LEFT HAND  . CORONARY ANGIOPLASTY     dr h Tamala Julian [phone] with one stent  . CRANIOTOMY Right 06/09/2015   Procedure: CRANIOTOMY TUMOR EXCISION WITH APPLICATION OF CRANIAL NAVIGATION;  Surgeon: Dellis Filbert  Arnoldo Morale, MD;  Location: [facility];  Service: Neurosurgery;  Laterality: Right;  Right temporal craniotomy for resection of tumor  . EYE SURGERY  01/07/09   LEFT CATARACT  . EYE SURGERY  01/21/09   RIGHT CATARACT  . FINGER CONTRACTURE RELEASE  8/12    Dr. Amedeo Plenty   right hand trigger finger release  . MEDIASTINOSCOPY  08/02/2011   Procedure: MEDIASTINOSCOPY;  Surgeon: Nicanor Alcon, MD;  Location: [facility];  Service: Thoracic;  Laterality: N/A;  . right power port placement Right      OB History   No obstetric history on file.      Home Medications    Prior to Admission medications   Medication Sig Start Date End Date Taking? Authorizing Provider  amLODipine (NORVASC) 10 MG tablet Take 10 mg by mouth every evening.    Yes [provider]   calcium carbonate (TUMS - DOSED IN MG ELEMENTAL CALCIUM) 500 MG chewable tablet Chew 2 tablets by mouth every 6 (six) hours as needed for indigestion or heartburn.   Yes [provider]  CANNABIDIOL PO Take 0.5 mLs by mouth every 12 (twelve) hours as needed (pain).   Yes [provider]  cetirizine (ZYRTEC) 10 MG tablet Take 10 mg by mouth daily. For 10 days Started on 10.3.2020   Yes [provider]  dexamethasone (DECADRON) 2 MG tablet Take 2 mg by mouth daily.   Yes [provider]  famotidine (PEPCID) 20 MG tablet Take 20 mg by mouth 2 (two) times daily.   Yes [provider]  guaiFENesin (MUCINEX) 600 MG 12 hr tablet Take 600 mg by mouth 2 (two) times daily. For 10 days Started on 10.3.2020   Yes [provider]  labetalol (NORMODYNE) 100 MG tablet Take 100 mg by mouth 2 (two) times daily.    Yes [provider]  lidocaine (XYLOCAINE) 5 % ointment Apply 1 application topically 3 (three) times daily as needed. Patient taking differently: Apply 1 application topically 3 (three) times daily as needed (for rib pain).  09/06/18  Yes Long, Wonda Old[facility], MD  Multiple Vitamin (MULTIVITAMIN WITH MINERALS) TABS tablet Take 1 tablet by mouth daily.   Yes [provider]  nitroGLYCERIN (NITROSTAT) 0.4 MG SL tablet Place 0.4 mg under the tongue every 5 (five) minutes as needed for chest pain.   Yes [provider]  polyvinyl alcohol (LIQUIFILM TEARS) 1.4 % ophthalmic solution Place 1 drop into both eyes 3 (three) times daily.   Yes [provider]  rOPINIRole (REQUIP) 1 MG tablet Take 2 mg by mouth at bedtime.  06/28/15  Yes [provider]  sertraline (ZOLOFT) 50 MG tablet Take 50 mg by mouth daily.   Yes [provider]  valsartan-hydrochlorothiazide (DIOVAN-HCT) 320-25 MG tablet Take 1 tablet by mouth daily.   Yes [provider]  vitamin C (ASCORBIC ACID) 500 MG tablet Take 500 mg by mouth  daily.   Yes [provider]  Cholecalciferol (VITAMIN D) 2000 units CAPS Take by mouth.    [provider]  NON FORMULARY Inject into the vein 2 (two) times daily. IV Vitamin C infusion twice weekly via PAC    [provider]  olmesartan-hydrochlorothiazide (BENICAR HCT) 40-25 MG tablet Take 1 tablet by mouth daily. 04/23/17   [provider]  Omega-3 Fatty Acids (FISH OIL PO) Take 4,000 Units by mouth 2 (two) times daily.    [provider]  potassium chloride (K-DUR) 10 MEQ  tablet Take 1 tablet (10 mEq total) by mouth daily. 07/31/18   Ladell Pier, MD  valsartan (DIOVAN) 320 MG tablet Take 1 tablet (320 mg total) by mouth daily. 07/31/18   Ladell Pier, MD  vitamin E (VITAMIN E) 400 UNIT capsule Take 1 capsule (400 Units total) by mouth 2 (two) times daily. 01/25/17   Hayden Pedro, PA-C    Family History Family History  Problem Relation Age of Onset  . Healthy Son   . Healthy Son   . Anesthesia problems Neg Hx   . Hypotension Neg Hx   . Malignant hyperthermia Neg Hx   . Pseudochol deficiency Neg Hx     Social History Social History   Tobacco Use  . Smoking status: Former Smoker    Packs/day: 0.50    Years: 10.00    Pack years: 5.00    Types: Cigarettes    Quit date: 07/12/2005    Years since quitting: 13.6  . Smokeless tobacco: Never Used  Substance Use Topics  . Alcohol use: No  . Drug use: No     Allergies   Lipitor [atorvastatin calcium], Tape, Food, Pantoprazole sodium, and Salami [pickled meat]   Review of Systems Review of Systems  Constitutional: Positive for fever. Negative for activity change, appetite change, chills, diaphoresis and unexpected weight change.  HENT: Negative for congestion and sore throat.   Eyes: Negative for visual disturbance.  Respiratory: Positive for cough. Negative for shortness of breath and wheezing.   Cardiovascular: Positive for chest pain. Negative for palpitations and  leg swelling.  Gastrointestinal: Positive for abdominal pain. Negative for blood in stool, constipation, diarrhea, nausea and vomiting.  Genitourinary: Negative for dysuria.  Musculoskeletal: Negative for arthralgias and back pain.  Skin: Negative for rash.  Allergic/Immunologic: Positive for immunocompromised state.  Neurological: Positive for weakness (Generalized). Negative for dizziness, speech difficulty, light-headedness and headaches.     Physical Exam Updated Vital Signs BP 129/61 (BP Location: Right Arm)   Pulse (!) 51   Temp 99.1 F (37.3 C) (Rectal)   Resp 19   SpO2 95%   Physical Exam Vitals signs and nursing note reviewed.  Constitutional:      General: She is not in acute distress.    Appearance: Normal appearance. She is not toxic-appearing or diaphoretic.     Comments: Chronically ill-appearing.  HENT:     Head: Normocephalic.     Mouth/Throat:     Mouth: Mucous membranes are moist.     Pharynx: Oropharynx is clear.  Eyes:     Conjunctiva/sclera: Conjunctivae normal.     Pupils: Pupils are equal, round, and reactive to light.  Cardiovascular:     Rate and Rhythm: Regular rhythm. Bradycardia present.  Pulmonary:     Effort: Pulmonary effort is normal.     Breath sounds: Examination of the right-lower field reveals rales. Examination of the left-lower field reveals rales. Rales present.  Chest:     Chest wall: No mass, deformity or tenderness.  Abdominal:     Palpations: Abdomen is soft.     Tenderness: There is no abdominal tenderness. There is no guarding or rebound.  Musculoskeletal:     Right lower leg: No edema.     Left lower leg: No edema.  Skin:    General: Skin is warm and dry.     Capillary Refill: Capillary refill takes less than 2 seconds.     Coloration: Skin is not cyanotic.  Neurological:  General: No focal deficit present.     Mental Status: She is alert.     Comments: Patient is lethargic and sleepy but easily awakened.  She is  alert and oriented x3 and answering questions appropriately.  Psychiatric:        Mood and Affect: Mood normal.      ED Treatments / Results  Labs (all labs ordered are listed, but only abnormal results are displayed) Labs Reviewed  BASIC METABOLIC PANEL - Abnormal; Notable for the following components:      Result Value   Potassium 3.3 (*)    Glucose, Bld 109 (*)    BUN 24 (*)    All other components within normal limits  BRAIN NATRIURETIC PEPTIDE - Abnormal; Notable for the following components:   B Natriuretic Peptide 193.2 (*)    All other components within normal limits  URINALYSIS, ROUTINE W REFLEX MICROSCOPIC - Abnormal; Notable for the following components:   Leukocytes,Ua TRACE (*)    Bacteria, UA RARE (*)    All other components within normal limits  POCT I-STAT 7, (LYTES, BLD GAS, ICA,H+H) - Abnormal; Notable for the following components:   pO2, Arterial 109.0 (*)    Potassium 3.3 (*)    All other components within normal limits  TROPONIN I (HIGH SENSITIVITY) - Abnormal; Notable for the following components:   Troponin I (High Sensitivity) 22 (*)    All other components within normal limits  SARS CORONAVIRUS 2 (HOSPITAL ORDER, [facility])  CULTURE, BLOOD (ROUTINE X 2)  CULTURE, BLOOD (ROUTINE X 2)  CBC WITH DIFFERENTIAL/PLATELET  LACTIC ACID, PLASMA  I-STAT ARTERIAL BLOOD GAS, ED  TROPONIN I (HIGH SENSITIVITY)    EKG EKG Interpretation  Date/Time:  Tuesday March 04 2019 11:18:47 EDT Ventricular Rate:  50 PR Interval:    QRS Duration: 100 QT Interval:  481 QTC Calculation: 439 R Axis:   53 Text Interpretation:  Sinus rhythm similar to Sept 2020 Confirmed by Sherwood Gambler [phone]) on 03/04/2019 11:33:16 AM   Radiology Dg Chest Portable 1 View  Result Date: 03/04/2019 CLINICAL DATA:  Shortness of breath, cough, and hypotension. EXAM: PORTABLE CHEST 1 VIEW COMPARISON:  Chest x-ray and chest CT dated 02/20/2019 FINDINGS: Power  port in place, unchanged with the tip at the cavoatrial junction. Heart size and pulmonary vascularity are normal. Aortic atherosclerosis. No infiltrates or effusions. No acute bone abnormality. IMPRESSION: 1. No acute abnormalities. 2. Aortic atherosclerosis. Electronically Signed   By: Lorriane Shire M.D.   On: 03/04/2019 12:38    Procedures Procedures (including critical care time)  Medications Ordered in ED Medications  ipratropium-albuterol (DUONEB) 0.5-2.5 (3) MG/3ML nebulizer solution 3 mL (3 mLs Nebulization Given 03/04/19 1459)  sodium chloride 0.9 % bolus 1,000 mL (0 mLs Intravenous Stopped 03/04/19 1334)  methylPREDNISolone sodium succinate (SOLU-MEDROL) 125 mg/2 mL injection 125 mg (125 mg Intravenous Given 03/04/19 1500)     Initial Impression / Assessment and Plan / ED Course  I have reviewed the triage vital signs and the nursing notes.  Pertinent labs & imaging results that were available during my care of the patient were reviewed by me and considered in my medical decision making (see chart for details).  Clinical Course as of Mar 04 1515  Tue Mar 04, 2019  1140 84 y/o F with multiple medical problems sent from cardiologist for ?covid symptoms, respiratory distress and hypotension. Patient mildly hypotensive 121/57 on arrival here. Appears ill but stable. Will obtain septic workup  as well as BNP, ABG. Patient pulse rate 45 but no heart block on EKG. Has hx of low HR at multiple visits in the past and is on a beta blocker.   [KM]  [place] with Dr. Doristine Bosworth who will admit patient for COPD exacerbation   [KM]    Clinical Course User Index [KM] Alveria Apley, PA-C       CRITICAL CARE Performed by: Alveria Apley   Total critical care time: 35 minutes  Critical care time was exclusive of separately billable procedures and treating other patients.  Critical care was necessary to treat or prevent imminent or life-threatening deterioration.  Critical care was time  spent personally by me on the following activities: development of treatment plan with patient and/or surrogate as well as nursing, discussions with consultants, evaluation of patient's response to treatment, examination of patient, obtaining history from patient or surrogate, ordering and performing treatments and interventions, ordering and review of laboratory studies, ordering and review of radiographic studies, pulse oximetry and re-evaluation of patient's condition.   Final Clinical Impressions(s) / ED Diagnoses   Final diagnoses:  COPD exacerbation [facility])    ED Discharge Orders    None       Kristine Royal 03/04/19 1516    Sherwood Gambler, MD 03/05/19 [phone]

## 2019-03-04 NOTE — ED Triage Notes (Addendum)
Pt arrives to ED with [facility] from [facility] with complaints of shortness of breath, cough and hypotension. Per EMS patient was dropped off to [facility] by her nursing home for [facility] for a follow up appointment. The heartcare staff took her blood pressure which was 80 palpated and saw she was in respiratory distress. Patient states she has been SOB for the last month.

## 2019-03-04 NOTE — ED Notes (Signed)
Urine culture collected and sent to main lab. 

## 2019-03-04 NOTE — H&P (Signed)
History and Physical    Margaret Hale CZY:[id number] DOB: 08/07/1934 DOA: 03/04/2019  PCP: [name], MD  Patient coming from: [facility] heart care  I have personally briefly reviewed patient's old medical records in [place]  Chief Complaint: Generalized weakness and hypotension  HPI: Margaret Hale is a 84 y.o. 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  Patient went to see her cardiologist this morning due to recurrent chest pain since last month.  In the office she became dizzy, weak and confused.  Her BP was found to be low-EMS was called who brought patient to the emergency department for further evaluation and management.  Patient reports that she has generalized weakness, unable to walk due to weakness, has productive cough with yellow sputum however denies association with fever, chills, shortness of breath, wheezing, sore throat, runny nose, recent COVID-19 exposure, loss of sense of smell or taste, nausea, vomiting, epigastric pain, urinary or bowel changes.  Patient came to ED on 9/24 with left-sided chest pain.  Chest x-ray and CT angiogram was obtained which came back negative for acute findings.  I reviewed her charts-she does not have history of COPD-not on any inhalers at nursing home.  I reviewed her medicines-she takes Decadron 2 mg once daily for unknown reason.  I talked to her son Mr. Ronalee Belts on [phone] and updated him regarding patient's status.  I discussed the CODE STATUS with patient's son who is  also a power of attorney-agreed with the DNR.  ED Course: Upon arrival: Patient was found to have hypotension, bradycardia, chest x-ray came back negative,  COVID-19: Negative, lactic acid: WNL.  Troponin slightly elevated at 22, CBC: WNL, CMP shows mild hypokalemia of 3.3.  Patient received IV fluids, Solu-Medrol loading dose and DuoNeb breathing treatment in the ED.  Review of Systems: As per HPI otherwise negative.    Past Medical History:  Diagnosis Date  . Adenomatous polyp 02/2009 &02/2012  . Anxiety   . Baker's cyst    L KNEE  . Bulging lumbar disc 1990  . CAD (coronary artery disease) 2007   PCI of RCA, bare metal stent.....Dr. Radford Pax  . Cancer (HCC)    lung mass  . Disc disorder of lumbar region    2 BULDGING DISCS DX 1990 BY DR. DALLDORF  . Dizzy   . Hx of radiation therapy 2018   [place] [facility]  . Hyperlipidemia   . Hypertension    Takes Novasc ,Clonidine, & Losartan  . Lipoma of back   . Menopausal flushing   . Multiple thyroid nodules   . No pertinent past medical history    lung mass  occas cough   . Osteoporosis   . Positive test for herpes simplex virus (HSV) antibody   . Sinusitis, chronic   . Spondylosis    NECK  . Thyroid disease    nodules,stable on u/s 2/12  . Vitamin D deficiency     Past Surgical History:  Procedure Laterality Date  . CARPAL TUNNEL RELEASE  06/26/08        04/20/09   RIGHT HAND          LEFT HAND  . CORONARY ANGIOPLASTY     dr h Tamala Julian [phone] with one stent  .  CRANIOTOMY Right 06/09/2015   Procedure: CRANIOTOMY TUMOR EXCISION WITH APPLICATION OF CRANIAL NAVIGATION;  Surgeon: Newman Pies, MD;  Location: [facility];  Service: Neurosurgery;  Laterality: Right;  Right temporal craniotomy for resection of tumor  . EYE SURGERY  01/07/09   LEFT CATARACT  . EYE SURGERY  01/21/09   RIGHT CATARACT  . FINGER CONTRACTURE RELEASE  8/12    Dr. Amedeo Plenty   right hand trigger finger release  . MEDIASTINOSCOPY  08/02/2011   Procedure: MEDIASTINOSCOPY;  Surgeon: Nicanor Alcon, MD;  Location: [facility];  Service: Thoracic;  Laterality: N/A;  . right power port placement Right      reports that she  quit smoking about 13 years ago. Her smoking use included cigarettes. She has a 5.00 pack-year smoking history. She has never used smokeless tobacco. She reports that she does not drink alcohol or use drugs.  Allergies  Allergen Reactions  . Lipitor [Atorvastatin Calcium] Other (See Comments)    MYALGIAS  . Tape Other (See Comments)    Per MAR  . Food Rash and Other (See Comments)    Hot Dogs  . Pantoprazole Sodium Rash  . [place] Brownie Meat] Rash    Family History  Problem Relation Age of Onset  . Healthy Son   . Healthy Son   . Anesthesia problems Neg Hx   . Hypotension Neg Hx   . Malignant hyperthermia Neg Hx   . Pseudochol deficiency Neg Hx     Prior to Admission medications   Medication Sig Start Date End Date Taking? Authorizing Provider  amLODipine (NORVASC) 10 MG tablet Take 10 mg by mouth every evening.    Yes [provider]  calcium carbonate (TUMS - DOSED IN MG ELEMENTAL CALCIUM) 500 MG chewable tablet Chew 2 tablets by mouth every 6 (six) hours as needed for indigestion or heartburn.   Yes [provider]  CANNABIDIOL PO Take 0.5 mLs by mouth every 12 (twelve) hours as needed (pain).   Yes [provider]  cetirizine (ZYRTEC) 10 MG tablet Take 10 mg by mouth daily. For 10 days Started on 10.3.2020   Yes [provider]  dexamethasone (DECADRON) 2 MG tablet Take 2 mg by mouth daily.   Yes [provider]  famotidine (PEPCID) 20 MG tablet Take 20 mg by mouth 2 (two) times daily.   Yes [provider]  guaiFENesin (MUCINEX) 600 MG 12 hr tablet Take 600 mg by mouth 2 (two) times daily. For 10 days Started on 10.3.2020   Yes [provider]  labetalol (NORMODYNE) 100 MG tablet Take 100 mg by mouth 2 (two) times daily.    Yes [provider]  lidocaine (XYLOCAINE) 5 % ointment Apply 1 application topically 3 (three) times daily as needed. Patient taking differently: Apply 1 application topically 3  (three) times daily as needed (for rib pain).  09/06/18  Yes Long, Wonda Olds, MD  Multiple Vitamin (MULTIVITAMIN WITH MINERALS) TABS tablet Take 1 tablet by mouth daily.   Yes [provider]  nitroGLYCERIN (NITROSTAT) 0.4 MG SL tablet Place 0.4 mg under the tongue every 5 (five) minutes as needed for chest pain.   Yes [provider]  polyvinyl alcohol (LIQUIFILM TEARS) 1.4 % ophthalmic solution Place 1 drop into both eyes 3 (three) times daily.   Yes [provider]  rOPINIRole (REQUIP) 1 MG tablet Take 2 mg by mouth at bedtime.  06/28/15  Yes [provider]  sertraline (ZOLOFT) 50 MG  tablet Take 50 mg by mouth daily.   Yes [provider]  valsartan-hydrochlorothiazide (DIOVAN-HCT) 320-25 MG tablet Take 1 tablet by mouth daily.   Yes [provider]  vitamin C (ASCORBIC ACID) 500 MG tablet Take 500 mg by mouth daily.   Yes [provider]  Cholecalciferol (VITAMIN D) 2000 units CAPS Take by mouth.    [provider]  NON FORMULARY Inject into the vein 2 (two) times daily. IV Vitamin C infusion twice weekly via PAC    [provider]  olmesartan-hydrochlorothiazide (BENICAR HCT) 40-25 MG tablet Take 1 tablet by mouth daily. 04/23/17   [provider]  Omega-3 Fatty Acids (FISH OIL PO) Take 4,000 Units by mouth 2 (two) times daily.    [provider]  potassium chloride (K-DUR) 10 MEQ tablet Take 1 tablet (10 mEq total) by mouth daily. 07/31/18   Ladell Pier, MD  valsartan (DIOVAN) 320 MG tablet Take 1 tablet (320 mg total) by mouth daily. 07/31/18   Ladell Pier, MD  vitamin E (VITAMIN E) 400 UNIT capsule Take 1 capsule (400 Units total) by mouth 2 (two) times daily. 01/25/17   Hayden Pedro, PA-C    Physical Exam: Vitals:   03/04/19 1345 03/04/19 1400 03/04/19 1415 03/04/19 1454  BP: (!) 128/95 (!) 142/80 (!) 145/60 129/61  Pulse:    (!) 51  Resp: 15 (!) 21 (!) 21 19  Temp:       TempSrc:      SpO2:    95%    Constitutional: NAD, calm, comfortable Vitals:   03/04/19 1345 03/04/19 1400 03/04/19 1415 03/04/19 1454  BP: (!) 128/95 (!) 142/80 (!) 145/60 129/61  Pulse:    (!) 51  Resp: 15 (!) 21 (!) 21 19  Temp:      TempSrc:      SpO2:    95%   Constitutional: Alert and oriented x3, not in acute distress, appears dehydrated, lethargic and weak,  Eyes: PERRL, lids and conjunctivae normal ENMT: Mucous membranes are dry. Posterior pharynx clear of any exudate or lesions.Normal dentition.  Neck: normal, supple, no masses, no thyromegaly Respiratory: clear to auscultation bilaterally, no wheezing, no crackles. Normal respiratory effort. No accessory muscle use.  Cardiovascular: Regular rate and rhythm, no murmurs / rubs / gallops. No extremity edema. 2+ pedal pulses. No carotid bruits.  Abdomen: no tenderness, no masses palpated. No hepatosplenomegaly. Bowel sounds positive.  Musculoskeletal: no clubbing / cyanosis. No joint deformity upper and lower extremities. Good ROM, no contractures. Normal muscle tone.  Skin: no rashes, lesions, ulcers. No induration Neurologic: CN 2-12 grossly intact. Sensation intact, DTR normal. Strength 5/5 in all 4.  Psychiatric: Normal judgment and insight. Alert and oriented x 3. Normal mood.    Labs on Admission: I have personally reviewed following labs and imaging studies  CBC: Recent Labs  Lab 03/04/19 1139 03/04/19 1153  WBC 8.0  --   NEUTROABS 6.1  --   HGB 12.9 12.2  HCT 39.1 36.0  MCV 92.9  --   PLT 162  --    Basic Metabolic Panel: Recent Labs  Lab 03/04/19 1139 03/04/19 1153  NA 142 142  K 3.3* 3.3*  CL 106  --   CO2 24  --   GLUCOSE 109*  --   BUN 24*  --   CREATININE 0.78  --   CALCIUM 9.0  --    GFR: Estimated Creatinine Clearance: 55.6 mL/min (by C-G formula based on  SCr of 0.78 mg/dL). Liver Function Tests: No results for input(s): AST, ALT, ALKPHOS, BILITOT, PROT, ALBUMIN in the last 168 hours.  No results for input(s): LIPASE, AMYLASE in the last 168 hours. No results for input(s): AMMONIA in the last 168 hours. Coagulation Profile: No results for input(s): INR, PROTIME in the last 168 hours. Cardiac Enzymes: No results for input(s): CKTOTAL, CKMB, CKMBINDEX, TROPONINI in the last 168 hours. BNP (last 3 results) No results for input(s): PROBNP in the last 8760 hours. HbA1C: No results for input(s): HGBA1C in the last 72 hours. CBG: No results for input(s): GLUCAP in the last 168 hours. Lipid Profile: No results for input(s): CHOL, HDL, LDLCALC, TRIG, CHOLHDL, LDLDIRECT in the last 72 hours. Thyroid Function Tests: No results for input(s): TSH, T4TOTAL, FREET4, T3FREE, THYROIDAB in the last 72 hours. Anemia Panel: No results for input(s): VITAMINB12, FOLATE, FERRITIN, TIBC, IRON, RETICCTPCT in the last 72 hours. Urine analysis:    Component Value Date/Time   COLORURINE YELLOW 03/04/2019 1419   APPEARANCEUR CLEAR 03/04/2019 1419   LABSPEC 1.024 03/04/2019 1419   LABSPEC 1.015 11/18/2015 1555   PHURINE 5.0 03/04/2019 1419   GLUCOSEU NEGATIVE 03/04/2019 1419   GLUCOSEU Negative 11/18/2015 1555   HGBUR NEGATIVE 03/04/2019 1419   BILIRUBINUR NEGATIVE 03/04/2019 1419   BILIRUBINUR Negative 11/18/2015 [place] 03/04/2019 1419   PROTEINUR NEGATIVE 03/04/2019 1419   UROBILINOGEN 0.2 11/18/2015 1555   NITRITE NEGATIVE 03/04/2019 1419   LEUKOCYTESUR TRACE (A) 03/04/2019 1419   LEUKOCYTESUR Large 11/18/2015 1555    Radiological Exams on Admission: Dg Chest Portable 1 View  Result Date: 03/04/2019 CLINICAL DATA:  Shortness of breath, cough, and hypotension. EXAM: PORTABLE CHEST 1 VIEW COMPARISON:  Chest x-ray and chest CT dated 02/20/2019 FINDINGS: Power port in place, unchanged with the tip at the cavoatrial junction. Heart size and pulmonary vascularity are normal. Aortic atherosclerosis. No infiltrates or effusions. No acute bone abnormality. IMPRESSION: 1.  No acute abnormalities. 2. Aortic atherosclerosis. Electronically Signed   By: Lorriane Shire M.D.   On: 03/04/2019 12:38    EKG: Sinus bradycardia, no acute ST-T wave changes noted.  Assessment/Plan Principal Problem:   Weakness generalized Active Problems:   CAD (coronary artery disease)   Benign essential HTN   Dyslipidemia   Small cell carcinoma of right lung (HCC)   Port-A-Cath in place   Bradycardia   Dehydration   Hypokalemia   Generalized weakness: -Patient presented with dizziness, generalized weakness and hypotensive.  Could be secondary to polypharmacy vs dehydration vs adrenal insufficiency-due to chronic steroid use (she takes Decadron 2 mg once daily) -Upon arrival: Hypotensive and bradycardic.  BUN is slightly elevated. -She received loading dose of Solu-Medrol, duo nebs and IV fluids in the ED. -Admit patient for observation. -Continue IV fluids & Decadron 2 mg once daily.  On telemetry. -Patient is afebrile, no leukocytosis, lactic acid: WNL, chest x-ray is negative for infection. -We will monitor her blood pressure closely.    Hypotension: -Could be secondary to polypharmacy versus dehydration versus adrenal insufficiency from chronic steroid use -She is on multiple antihypertensive medicine such as amlodipine, labetalol, valsartan-HCTZ -We will hold her blood pressure medicine at this time -Continue IV fluids and monitor her blood pressure closely.  Bradycardia: -EKG shows sinus bradycardia likely secondary to beta-blocker -Hold beta-blocker for now.  On telemetry -Initial troponin: 22, will trend  Hypokalemia: -Likely secondary to dehydration -Replenished and monitor BMP tomorrow a.m.  Coronary artery disease status post stents in  2007: -Patient denies ACS symptoms -Initial troponin: 22, trend. -We will continue statin and nitro PRN for chest pain  History of small cell lung cancer with brain mets: -Last chemotherapy 2 years ago as per son -Chest  x-ray is negative.  Depression: Stable -Continue Zoloft  Restless leg syndrome: -Continue Requip  Thyroid nodules: -Incidental finding on CT angiogram from 9/20 -Check TSH.  DVT prophylaxis: TED/SCD/Lovenox Code Status: DNR Family Communication: None present at bedside.  Plan of care discussed with patient in length and he verbalized understanding and agreed with it. Disposition Plan: TBD Consults called: None Admission status: Observation  Mckinley Jewel MD [facility] Pager [phone]  If 7PM-7AM, please contact night-coverage [email or web address] Password [email or web address]  03/04/2019, 3:47 PM

## 2019-03-05 DIAGNOSIS — R531 Weakness: Secondary | ICD-10-CM | POA: Diagnosis not present

## 2019-03-05 LAB — BASIC METABOLIC PANEL
Anion gap: 11 (ref 5–15)
BUN: 30 mg/dL — ABNORMAL HIGH (ref 8–23)
CO2: 23 mmol/L (ref 22–32)
Calcium: 9 mg/dL (ref 8.9–10.3)
Chloride: 108 mmol/L (ref 98–111)
Creatinine, Ser: 0.85 mg/dL (ref 0.44–1.00)
GFR calc Af Amer: >60 mL/min (ref >60)
GFR calc non Af Amer: >60 mL/min (ref >60)
Glucose, Bld: 211 mg/dL — ABNORMAL HIGH (ref 70–99)
Potassium: 4 mmol/L (ref 3.5–5.1)
Sodium: 142 mmol/L (ref 135–145)

## 2019-03-05 LAB — CBC
HCT: 38.2 % (ref 36.0–46.0)
Hemoglobin: 12.8 g/dL (ref 12.0–15.0)
MCH: 30.3 pg (ref 26.0–34.0)
MCHC: 33.5 g/dL (ref 30.0–36.0)
MCV: 90.3 fL (ref 80.0–100.0)
Platelets: 150 10*3/uL (ref 150–400)
RBC: 4.23 MIL/uL (ref 3.87–5.11)
RDW: 15.2 % (ref 11.5–15.5)
WBC: 6.3 10*3/uL (ref 4.0–10.5)
nRBC: 0 % (ref 0.0–0.2)

## 2019-03-05 LAB — MRSA PCR SCREENING: MRSA by PCR: NEGATIVE

## 2019-03-05 MED ORDER — HEPARIN SOD (PORK) LOCK FLUSH 100 UNIT/ML IV SOLN
500.0000 [IU] | INTRAVENOUS | Status: AC | PRN
  Administered 2019-03-05: 500 [IU]
  Filled 2019-03-05: qty 5

## 2019-03-05 MED ORDER — GUAIFENESIN ER 600 MG PO TB12
600.0000 mg | ORAL_TABLET | Freq: Two times a day (BID) | ORAL | Status: DC
  Administered 2019-03-05: 600 mg via ORAL
  Filled 2019-03-05: qty 1

## 2019-03-05 MED ORDER — ACETAMINOPHEN 325 MG PO TABS: 650.0000 mg | ORAL_TABLET | Freq: Four times a day (QID) | ORAL | Status: AC | PRN

## 2019-03-05 MED ORDER — LABETALOL HCL 100 MG PO TABS
50.0000 mg | ORAL_TABLET | Freq: Two times a day (BID) | ORAL | Status: DC
  Administered 2019-03-05: 15:00:00 50 mg via ORAL
  Filled 2019-03-05 (×2): qty 0.5

## 2019-03-05 MED ORDER — LABETALOL HCL 100 MG PO TABS: 50.0000 mg | ORAL_TABLET | Freq: Two times a day (BID) | ORAL | Status: AC

## 2019-03-05 MED ORDER — AMLODIPINE BESYLATE 10 MG PO TABS: 5.0000 mg | ORAL_TABLET | Freq: Every evening | ORAL | Status: AC

## 2019-03-05 NOTE — Progress Notes (Signed)
Patient is wanting to know what plan of treatment will be. Paged MD. Page returned [phone] advise would come to bedside.

## 2019-03-05 NOTE — Discharge Summary (Addendum)
Physician Discharge Summary  Margaret Hale TKP:[id number] DOB: 01/05/1935 DOA: 03/04/2019  PCP: [name], MD  Admit date: 03/04/2019 Discharge date: 03/05/2019  Admitted From: snf Discharge disposition: snf   Recommendations for Outpatient Follow-Up:   1. Have decreased her labetolol for bradycardia 2. Per her MAR she was on 2 ARB/HCTZ combo meds?  I have d/c'd 3. Monitor BP and adjust medications- avoid hypotension 4. mucinex 5. Afrin for 3 days if needed for nasal drainage   Discharge Diagnosis:   Principal Problem:   Weakness generalized Active Problems:   CAD (coronary artery disease)   Benign essential HTN   Dyslipidemia   Small cell carcinoma of right lung (HCC)   Port-A-Cath in place   Bradycardia   Dehydration   Hypokalemia    Discharge Condition: Improved.  Diet recommendation: Low sodium, heart health  Wound care: None.  Code status: dnr   History of Present Illness:   Margaret Hale is a 84 y.o. 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  Patient went to see her cardiologist this morning due to recurrent chest pain since last month.  In the office she became dizzy, weak and confused.  Her BP was found to be low-EMS was called who brought patient to the emergency department for further evaluation and management.  Patient reports that she has generalized weakness, unable to walk due to weakness, has productive cough with yellow sputum however denies association with fever, chills, shortness of breath, wheezing, sore throat, runny nose, recent COVID-19 exposure, loss of sense of smell or taste, nausea, vomiting, epigastric pain, urinary or  bowel changes.  Patient came to ED on 9/24 with left-sided chest pain.  Chest x-ray and CT angiogram was obtained which came back negative for acute findings.  I reviewed her charts-she does not have history of COPD-not on any inhalers at nursing home.  I reviewed her medicines-she takes Decadron 2 mg once daily for unknown reason.   Hospital Course by Problem:     Hypotension: -She is on multiple antihypertensive medicine such as amlodipine, labetalol, valsartan-HCTZ -adjust BP medications -Patient is afebrile, no leukocytosis, lactic acid: WNL, chest x-ray is negative for infection.  Bradycardia: -EKG shows sinus bradycardia likely secondary to beta-blocker -cut BB In half  Hypokalemia: -repleted -per [facility] was on 2 ARB/HCTZ combos?  Have d/c'd this  Coronary artery disease status post stents in 2007: -Patient denies ACS symptoms -troponin flat  History of small cell lung cancer with brain mets: -Last chemotherapy 2 years ago -Chest x-ray is negative.  Depression: Stable -Continue Zoloft   Restless leg syndrome: -Continue Requip  Thyroid nodules: -Incidental finding on CT angiogram from 9/20 -TSH normal  Called son to find out baseline mental status as well as ambulatory status-- from chart, it appears she is in wheelchair-- no answer  Medical Consultants:      Discharge Exam:   Vitals:   03/05/19 0430 03/05/19 0745  BP:  (!) 165/76  Pulse:  73  Resp: 14 18  Temp:  98 F (36.7 C)  SpO2:  93%   Vitals:   03/04/19 2233 03/05/19 0342 03/05/19 0430 03/05/19 0745  BP: (!) 142/71 (!) 168/85  (!) 165/76  Pulse: 65 64  73  Resp: 17 (!) 23 14 18   Temp: 98.4 F (36.9 C) 97.8 F (36.6 C)  98 F (36.7 C)  TempSrc: Oral Oral  Oral  SpO2: 95% 98%  93%  Weight: 84.9 kg     Height: 5\' 5"  (1.651 m)       General exam: Appears calm and comfortable. Says she wants to go home and we are holding her here against her wishes   The results of significant  diagnostics from this hospitalization (including imaging, microbiology, ancillary and laboratory) are listed below for reference.     Procedures and Diagnostic Studies:   Dg Chest Portable 1 View  Result Date: 03/04/2019 CLINICAL DATA:  Shortness of breath, cough, and hypotension. EXAM: PORTABLE CHEST 1 VIEW COMPARISON:  Chest x-ray and chest CT dated 02/20/2019 FINDINGS: Power port in place, unchanged with the tip at the cavoatrial junction. Heart size and pulmonary vascularity are normal. Aortic atherosclerosis. No infiltrates or effusions. No acute bone abnormality. IMPRESSION: 1. No acute abnormalities. 2. Aortic atherosclerosis. Electronically Signed   By: Lorriane Shire M.D.   On: 03/04/2019 12:38     Labs:   Basic Metabolic Panel: Recent Labs  Lab 03/04/19 1139 03/04/19 1153 03/04/19 1530 03/05/19 0220  NA 142 142  --  142  K 3.3* 3.3*  --  4.0  CL 106  --   --  108  CO2 24  --   --  23  GLUCOSE 109*  --   --  211*  BUN 24*  --   --  30*  CREATININE 0.78  --  0.72 0.85  CALCIUM 9.0  --   --  9.0  MG  --   --  2.1  --   PHOS  --   --  4.5  --    GFR Estimated Creatinine Clearance: 54.9 mL/min (by C-G formula based on SCr of 0.85 mg/dL). Liver Function Tests: No results for input(s): AST, ALT, ALKPHOS, BILITOT, PROT, ALBUMIN in the last 168 hours. No results for input(s): LIPASE, AMYLASE in the last 168 hours. No results for input(s): AMMONIA in the last 168 hours. Coagulation profile No results for input(s): INR, PROTIME in the last 168 hours.  CBC: Recent Labs  Lab 03/04/19 1139 03/04/19 1153 03/04/19 1530 03/05/19 0220  WBC 8.0  --  8.2 6.3  NEUTROABS 6.1  --   --   --   HGB 12.9 12.2 13.0 12.8  HCT 39.1 36.0 40.1 38.2  MCV 92.9  --  92.6 90.3  PLT 162  --  177 150   Cardiac Enzymes: No results for input(s): CKTOTAL, CKMB, CKMBINDEX, TROPONINI in the last 168 hours. BNP: Invalid input(s): POCBNP CBG: No results for input(s): GLUCAP in the last 168  hours. D-Dimer No results for input(s): DDIMER in the last 72 hours. Hgb A1c No results for input(s): HGBA1C in the last 72 hours. Lipid Profile No results for input(s): CHOL, HDL, LDLCALC, TRIG, CHOLHDL, LDLDIRECT in the last 72 hours. Thyroid function studies Recent Labs    03/04/19 1530  TSH 1.050   Anemia work up No results for input(s): VITAMINB12, FOLATE, FERRITIN, TIBC, IRON, RETICCTPCT in the last 72 hours. Microbiology Recent Results (from the past 240 hour(s))  SARS Coronavirus 2 [facility] order, Performed in [facility] hospital lab) Nasopharyngeal Nasopharyngeal Swab     Status: None   Collection Time: 03/04/19 11:39 AM   Specimen: Nasopharyngeal Swab  Result Value Ref Range Status   SARS Coronavirus 2 NEGATIVE NEGATIVE  Final    Comment: (NOTE) If result is NEGATIVE SARS-CoV-2 target nucleic acids are NOT DETECTED. The SARS-CoV-2 RNA is generally detectable in upper and lower  respiratory specimens during the acute phase of infection. The lowest  concentration of SARS-CoV-2 viral copies this assay can detect is 250  copies / mL. A negative result does not preclude SARS-CoV-2 infection  and should not be used as the sole basis for treatment or other  patient management decisions.  A negative result may occur with  improper specimen collection / handling, submission of specimen other  than nasopharyngeal swab, presence of viral mutation(s) within the  areas targeted by this assay, and inadequate number of viral copies  (<250 copies / mL). A negative result must be combined with clinical  observations, patient history, and epidemiological information. If result is POSITIVE SARS-CoV-2 target nucleic acids are DETECTED. The SARS-CoV-2 RNA is generally detectable in upper and lower  respiratory specimens dur ing the acute phase of infection.  Positive  results are indicative of active infection with SARS-CoV-2.  Clinical  correlation with patient history and other  diagnostic information is  necessary to determine patient infection status.  Positive results do  not rule out bacterial infection or co-infection with other viruses. If result is PRESUMPTIVE POSTIVE SARS-CoV-2 nucleic acids MAY BE PRESENT.   A presumptive positive result was obtained on the submitted specimen  and confirmed on repeat testing.  While 2019 novel coronavirus  (SARS-CoV-2) nucleic acids may be present in the submitted sample  additional confirmatory testing may be necessary for epidemiological  and / or clinical management purposes  to differentiate between  SARS-CoV-2 and other Sarbecovirus currently known to infect humans.  If clinically indicated additional testing with an alternate test  methodology [phone]) is advised. The SARS-CoV-2 RNA is generally  detectable in upper and lower respiratory sp ecimens during the acute  phase of infection. The expected result is Negative. Fact Sheet for Patients:  StrictlyIdeas.no Fact Sheet for Healthcare Providers: BankingDealers.co.za This test is not yet approved or cleared by the [place] FDA and has been authorized for detection and/or diagnosis of SARS-CoV-2 by FDA under an Emergency Use Authorization (EUA).  This EUA will remain in effect (meaning this test can be used) for the duration of the COVID-19 declaration under Section 564(b)(1) of the Act, 21 U.S.C. section 360bbb-3(b)(1), unless the authorization is terminated or revoked sooner. Performed at [facility], [place] [address], [place], [place] [address]   Blood culture (routine x 2)     Status: None (Preliminary result)   Collection Time: 03/04/19 11:55 AM   Specimen: BLOOD RIGHT ARM  Result Value Ref Range Status   Specimen Description BLOOD RIGHT ARM  Final   Special Requests   Final    BOTTLES DRAWN AEROBIC AND ANAEROBIC Blood Culture adequate volume   Culture   Final    NO GROWTH < 24 HOURS Performed  at [facility], [place] [address], [place], [place] [address]    Report Status PENDING  Incomplete  Blood culture (routine x 2)     Status: None (Preliminary result)   Collection Time: 03/04/19 12:00 PM   Specimen: BLOOD RIGHT HAND  Result Value Ref Range Status   Specimen Description BLOOD RIGHT HAND  Final   Special Requests   Final    BOTTLES DRAWN AEROBIC AND ANAEROBIC Blood Culture results may not be optimal due to an inadequate volume of blood received in culture bottles  Culture   Final    NO GROWTH < 24 HOURS Performed at [facility], [place] [address] [place], [place] [address]    Report Status PENDING  Incomplete  MRSA PCR Screening     Status: None   Collection Time: 03/04/19 10:45 PM   Specimen: Nasal Mucosa; Nasopharyngeal  Result Value Ref Range Status   MRSA by PCR NEGATIVE NEGATIVE Final    Comment:        The GeneXpert MRSA Assay (FDA approved for NASAL specimens only), is one component of a comprehensive MRSA colonization surveillance program. It is not intended to diagnose MRSA infection nor to guide or monitor treatment for MRSA infections. Performed at [facility], [place] [address] [place],  [address]      Discharge Instructions:   Discharge Instructions    Diet - low sodium heart healthy   Complete by: As directed    Discharge instructions   Complete by: As directed    Flutter valve Can add PRN nebs if needed Adjust BP medications as able   Increase activity slowly   Complete by: As directed      Allergies as of 03/05/2019      Reactions   Lipitor [atorvastatin Calcium] Other (See Comments)   MYALGIAS   Tape Other (See Comments)   Per MAR   Food Rash, Other (See Comments)   Hot Dogs   Pantoprazole Sodium Rash   Salami [pickled Meat] Rash      Medication List    STOP taking these medications   olmesartan-hydrochlorothiazide 40-25 MG tablet Commonly known as: BENICAR HCT   potassium chloride 10 MEQ tablet  Commonly known as: KLOR-CON   valsartan-hydrochlorothiazide 320-25 MG tablet Commonly known as: DIOVAN-HCT     TAKE these medications   acetaminophen 325 MG tablet Commonly known as: TYLENOL Take 2 tablets (650 mg total) by mouth every 6 (six) hours as needed for mild pain (or Fever >/= 101).   amLODipine 10 MG tablet Commonly known as: NORVASC Take 0.5 tablets (5 mg total) by mouth every evening. What changed: how much to take   calcium carbonate 500 MG chewable tablet Commonly known as: TUMS - dosed in mg elemental calcium Chew 2 tablets by mouth every 6 (six) hours as needed for indigestion or heartburn.   CANNABIDIOL PO Take 0.5 mLs by mouth every 12 (twelve) hours as needed (pain).   cetirizine 10 MG tablet Commonly known as: ZYRTEC Take 10 mg by mouth daily. For 10 days Started on 10.3.2020   dexamethasone 2 MG tablet Commonly known as: DECADRON Take 2 mg by mouth daily.   famotidine 20 MG tablet Commonly known as: PEPCID Take 20 mg by mouth 2 (two) times daily.   guaiFENesin 600 MG 12 hr tablet Commonly known as: MUCINEX Take 600 mg by mouth 2 (two) times daily. For 10 days Started on 10.3.2020   labetalol 100 MG tablet Commonly known as: NORMODYNE Take 0.5 tablets (50 mg total) by mouth 2 (two) times daily. What changed: how much to take   lidocaine 5 % ointment Commonly known as: XYLOCAINE Apply 1 application topically 3 (three) times daily as needed. What changed: reasons to take this   multivitamin with minerals Tabs tablet Take 1 tablet by mouth daily.   nitroGLYCERIN 0.4 MG SL tablet Commonly known as: NITROSTAT Place 0.4 mg under the tongue every 5 (five) minutes as needed for chest pain.   polyvinyl alcohol 1.4 % ophthalmic solution Commonly known  as: LIQUIFILM TEARS Place 1 drop into both eyes 3 (three) times daily.   rOPINIRole 1 MG tablet Commonly known as: REQUIP Take 2 mg by mouth at bedtime.   sertraline 50 MG tablet Commonly  known as: ZOLOFT Take 50 mg by mouth daily.   vitamin C 500 MG tablet Commonly known as: ASCORBIC ACID Take 500 mg by mouth daily.         Time coordinating discharge: 25 min  Signed:  Geradine Girt DO  [facility] 03/05/2019, 12:53 PM

## 2019-03-05 NOTE — Plan of Care (Signed)

## 2019-03-05 NOTE — Progress Notes (Signed)
Pt provided with verbal discharge instructions. Paper copy given to patient and copy sent to facility. Report called to [place], Zachery Conch. IV removed. VSS at d/c. Port cath deaccessed by IV team. Pt belongings sent with patient.  Patient transferred to facility via [facility].

## 2019-03-05 NOTE — TOC Initial Note (Signed)
Transition of Care [facility]) - Initial/Assessment Note    Patient Details  Name: Margaret Hale MRN: [id number] Date of Birth: [birth year]  [facility]) CM/SW Contact:    Alberteen Sam, [place] Phone Number: [phone] 03/05/2019, 1:39 PM  Clinical Narrative:                  CSW spoke with patient's son Ronalee Belts regarding discharge planning, he reports patient is from [place] and has been living there since July. Plan is for her to go back there as he works and no support at home to care for her.   CSW has reached out to [facility] they are following up to see if they have a bed avail today, if not they will have one tomorrow.   Expected Discharge Plan: Skilled Nursing Facility Barriers to Discharge: No Barriers Identified   Patient Goals and CMS Choice   CMS Medicare.gov Compare Post Acute Care list provided to:: Patient Represenative (must comment)(son Ronalee Belts) Choice offered to / list presented to : Adult Children(Mike)  Expected Discharge Plan and Services Expected Discharge Plan: [place] Choice: [place] Living arrangements for the past 2 months: [place] Expected Discharge Date: 03/05/19                                    Prior Living Arrangements/Services Living arrangements for the past 2 months: [place] Lives with:: Self Patient language and need for interpreter reviewed:: Yes Do you feel safe going back to the place where you live?: Yes      Need for Family Participation in Patient Care: Yes (Comment) Care giver support system in place?: Yes (comment)   Criminal Activity/Legal Involvement Pertinent to Current Situation/Hospitalization: No - Comment as needed  Activities of Daily Living Home Assistive Devices/Equipment: Wheelchair ADL Screening (condition at time of admission) Patient's cognitive ability adequate to safely complete daily activities?: Yes Is the  patient deaf or have difficulty hearing?: No Does the patient have difficulty seeing, even when wearing glasses/contacts?: No Does the patient have difficulty concentrating, remembering, or making decisions?: No Patient able to express need for assistance with ADLs?: Yes Does the patient have difficulty dressing or bathing?: Yes Independently performs ADLs?: Yes (appropriate for developmental age) Does the patient have difficulty walking or climbing stairs?: Yes Weakness of Legs: Both Weakness of Arms/Hands: None  Permission Sought/Granted Permission sought to share information with : Case Manager, Customer service manager, Family Supports Permission granted to share information with : Yes, Verbal Permission Granted  Share Information with NAME: Ronalee Belts  Permission granted to share info w AGENCY: SNFs  Permission granted to share info w Relationship: son  Permission granted to share info w Contact Information: [phone]  Emotional Assessment Appearance:: Appears stated age Attitude/Demeanor/Rapport: Gracious Affect (typically observed): Calm Orientation: : Oriented to Self, Oriented to  Time, Oriented to Place, Oriented to Situation Alcohol / Substance Use: Not Applicable Psych Involvement: No (comment)  Admission diagnosis:  COPD exacerbation ([place]) [J44.1] Patient Active Problem List   Diagnosis Date Noted  . Weakness generalized 03/04/2019  . Bradycardia 03/04/2019  . Dehydration 03/04/2019  . Hypokalemia 03/04/2019  . Port-A-Cath in place 07/15/2018  . UTI (urinary tract infection) 11/19/2015  . Port catheter in place 09/16/2015  . Small cell carcinoma of right lung ([place]) 08/03/2015  . Brain metastasis ([place]) 07/14/2015  . S/P  craniotomy 06/09/2015  . Benign essential HTN 02/11/2014  . Dyslipidemia 02/11/2014  . Chest pain 02/11/2014  . Restless leg syndrome 02/11/2014  . COPD (chronic obstructive pulmonary disease) ([place]) 09/17/2011  . Pulmonary nodules 09/13/2011   . CAD (coronary artery disease)   . Thyroid disease   . Baker's cyst   . Vitamin D deficiency   . Disc disorder of lumbar region    PCP:  [name], MD Pharmacy:   [facility], [place] [address] Phone: [phone] Fax: [phone]     Social Determinants of Health (SDOH) Interventions    Readmission Risk Interventions No flowsheet data found.

## 2019-03-05 NOTE — TOC Transition Note (Signed)
Transition of Care [facility]) - CM/SW Discharge Note   Patient Details  Name: Margaret Hale MRN: [id number] Date of Birth: Mar 14, 1935  Transition of Care [facility]) CM/SW Contact:  Alberteen Sam, LCSW Phone Number: 03/05/2019, 1:55 PM   Clinical Narrative:     Patient will DC to: [facility] Anticipated DC date: 03/05/2019 Family notified:Mike Transport LP:NPYY  Per MD patient ready for DC to [place] . RN, patient, patient's family, and facility notified of DC. Discharge Summary sent to facility once completed. RN given number for report  [phone] Room 118  . DC packet on chart. Ambulance transport requested for patient.  CSW signing off.  [place], [place] Isra Lindy    Final next level of care: Skilled Nursing Facility Barriers to Discharge: No Barriers Identified   Patient Goals and CMS Choice   CMS Medicare.gov Compare Post Acute Care list provided to:: Patient Represenative (must comment)(Mike) Choice offered to / list presented to : Adult Children(Mike)  Discharge Placement              Patient chooses bed at: Other - please specify in the comment section below:([facility]) Patient to be transferred to facility by: [place] Name of family member notified: Ronalee Belts Patient and family notified of of transfer: 03/05/19  Discharge Plan and Services     Post Acute Care Choice: [place]                               Social Determinants of Health (SDOH) Interventions     Readmission Risk Interventions No flowsheet data found.

## 2019-03-09 LAB — CULTURE, BLOOD (ROUTINE X 2)
Culture: NO GROWTH
Culture: NO GROWTH
Special Requests: ADEQUATE

## 2019-03-11 ENCOUNTER — Other Ambulatory Visit: Payer: Self-pay | Admitting: Radiology

## 2019-03-11 ENCOUNTER — Other Ambulatory Visit (HOSPITAL_COMMUNITY): Payer: Self-pay | Admitting: Radiology

## 2019-03-11 DIAGNOSIS — C7931 Secondary malignant neoplasm of brain: Secondary | ICD-10-CM

## 2019-03-17 ENCOUNTER — Telehealth: Payer: Self-pay | Admitting: Oncology

## 2019-03-17 ENCOUNTER — Inpatient Hospital Stay: Payer: Medicare Other | Attending: Oncology | Admitting: Oncology

## 2019-03-17 ENCOUNTER — Other Ambulatory Visit: Payer: Self-pay

## 2019-03-17 ENCOUNTER — Inpatient Hospital Stay: Payer: Medicare Other

## 2019-03-17 VITALS — BP 124/77 | HR 70 | Temp 98.4°F | Resp 20

## 2019-03-17 DIAGNOSIS — C3491 Malignant neoplasm of unspecified part of right bronchus or lung: Secondary | ICD-10-CM

## 2019-03-17 DIAGNOSIS — R531 Weakness: Secondary | ICD-10-CM | POA: Insufficient documentation

## 2019-03-17 DIAGNOSIS — R2689 Other abnormalities of gait and mobility: Secondary | ICD-10-CM | POA: Diagnosis not present

## 2019-03-17 DIAGNOSIS — R270 Ataxia, unspecified: Secondary | ICD-10-CM

## 2019-03-17 DIAGNOSIS — C7931 Secondary malignant neoplasm of brain: Secondary | ICD-10-CM | POA: Diagnosis not present

## 2019-03-17 DIAGNOSIS — I10 Essential (primary) hypertension: Secondary | ICD-10-CM | POA: Insufficient documentation

## 2019-03-17 DIAGNOSIS — Z95828 Presence of other vascular implants and grafts: Secondary | ICD-10-CM

## 2019-03-17 LAB — VITAMIN B12: Vitamin B-12: 254 pg/mL (ref 180–914)

## 2019-03-17 MED ORDER — SODIUM CHLORIDE 0.9% FLUSH
10.0000 mL | Freq: Once | INTRAVENOUS | Status: AC
  Administered 2019-03-17: 12:00:00 10 mL
  Filled 2019-03-17: qty 10

## 2019-03-17 MED ORDER — HEPARIN SOD (PORK) LOCK FLUSH 100 UNIT/ML IV SOLN
500.0000 [IU] | Freq: Once | INTRAVENOUS | Status: AC | PRN
  Administered 2019-03-17: 500 [IU] via INTRAVENOUS
  Filled 2019-03-17: qty 5

## 2019-03-17 NOTE — Telephone Encounter (Signed)
Scheduled per 10/19 los, patient received after visit summary and calender.

## 2019-03-17 NOTE — Progress Notes (Signed)
Patient reports she is either in bed or in her W/C in facility and she does not ambulate. Admits to fall almost monthly. Daily headaches that she reports are controlled with CBD oil (on med list).

## 2019-03-17 NOTE — Patient Instructions (Signed)

## 2019-03-17 NOTE — Progress Notes (Signed)
[place] OFFICE PROGRESS NOTE   Diagnosis: Small cell carcinoma  INTERVAL HISTORY:   Margaret Hale returns as scheduled.  She is now living in a nursing facility.  She complains of leg weakness and persistent balance difficulty.  No pain.  Good appetite.  She was admitted earlier this month with generalized weakness.  She was noted to be hypotensive.  Her blood pressure regimen was adjusted.  Objective:  Vital signs in last 24 hours:  Blood pressure 124/77, pulse 70, temperature 98.4 F (36.9 C), temperature source Temporal, resp. rate 20, SpO2 99 %.    HEENT: No thrush Lymphatics: No cervical, supraclavicular, or axillary nodes Resp: Scattered inspiratory rhonchi at the lower posterior chest bilaterally, bilateral expiratory rhonchi, no respiratory distress Cardio: Regular rate and rhythm Vascular: No leg edema Neuro: The motor exam is intact in the upper and lower extremities bilaterally, she is unable to stand, she has difficulty with finger-to-nose testing  Portacath/PICC-without erythema   Lab Results:  Lab Results  Component Value Date   WBC 6.3 03/05/2019   HGB 12.8 03/05/2019   HCT 38.2 03/05/2019   MCV 90.3 03/05/2019   PLT 150 03/05/2019   NEUTROABS 6.1 03/04/2019    CMP  Lab Results  Component Value Date   NA 142 03/05/2019   K 4.0 03/05/2019   CL 108 03/05/2019   CO2 23 03/05/2019   GLUCOSE 211 (H) 03/05/2019   BUN 30 (H) 03/05/2019   CREATININE 0.85 03/05/2019   CALCIUM 9.0 03/05/2019   PROT 6.6 02/20/2019   ALBUMIN 3.6 02/20/2019   AST 23 02/20/2019   ALT 32 02/20/2019   ALKPHOS 76 02/20/2019   BILITOT 0.6 02/20/2019   GFRNONAA >60 03/05/2019   GFRAA >60 03/05/2019     Medications: I have reviewed the patient's current medications.   Assessment/Plan: 1. Extensive stage small cell lung cancer  resection of right temporal brain mass 06/09/2015 confirmed metastatic small cell carcinoma  Staging PET scan at [facility]  07/22/2015 revealed decreased activity associated with previously seen pulmonary nodules , decreased activity of hypermetabolic mediastinal and bilateral hilar nodes , no new sites of FDG avid metastatic disease compared to a [facility] PET scan from November 2016  Cycle 1 etoposide/carboplatin 08/16/2015  Cycle 2 etoposide/carboplatin 09/14/2015  Cycle 3 etoposide/carboplatin 10/19/2015  Restaging chest CT 11/12/2015 with generally stable appearance of solid, sub-solid and ground glass pulmonary nodules bilaterally. Stable mildly prominent mediastinal lymph nodes. No progressive adenopathy or new findings.  Cycle 4 etoposide/carboplatin 11/16/2015   Brain MRI 01/24/2016-new 4 mm superior vermis metastasis  CT chest 06/07/2016-stable solid/sub-solid pulmonary nodules, stable mediastinal lymph nodes, no evidence of progressive disease  MRI brain 07/05/2016-progression of the cerebellar metastasis with associated edema and effacement of the upper fourth ventricle   SRS to cerebellarlesion 07/21/2016  MRI brain 10/20/2016-decrease in size and mass effect associated with the cerebellar lesion consistent with posttreatment change  MRI brain 01/22/2017-increased size, enhancement, and edema at the vermis metastasis, started onpentoxifyilline  Chest CT 02/14/2017-stable scattered solid for a nodules, stable small B-cell lymph nodes, improvement in sub-solid lesions in the bilateral upper lobes  Brain MRI 04/26/2017-improvement in the treated vermis metastasis, no new lesions  Brain MRI 07/27/2017-increased size in edema associated with cerebellar lesion, observation recommended  CT chest 08/03/2017-stable bilateral lung nodules, no evidence for disease progression  MRI 09/26/2017-enlargement of the cerebellar vermis lesion with progressive bilateral cerebellar edema  MRI brain 10/24/2017-increased size of superior vermis abnormal enhancement, stable central masslike component, stable  cerebellar edema, 6 mm enhancement at the ependyma of the right thalamus  stereotactic brain biopsy, insertion of hyperthermia catheter and MRI thermometry guided ablation of brain tumor using cool directional LITT on 12/06/2017.Biopsy showed metastatic small cell carcinoma  MRI brain at [facility] 01/10/2018- new enhancing lesion, increased enhancement surrounding ablation zone in the superior vermis, no change in nonspecific enhancing lesion in the right lateral jugular body  Gamma knife treatment vermis lesion at [facility] on 01/10/2018  CT chest 01/23/2018-no change in small mediastinal nodes and lung nodules  MRI brain at [facility] 03/12/2018-decreased enhancement at the ablation zone in the vermis with decreased mass-effect on the fourth ventricle, increase in size of an enhancing lesion in the body of the right lateral ventricle (8 mm), no new enhancing lesion  MRI brain at [facility] 07/25/2018- stable treated lesions at the superior cerebellar vermis and right temporal lobe, similar size and enhancement of ependymal lesion at the body of the right lateral ventricle  CT chest 07/15/2018-increased in size of spiculated right upper lobe lesion, new 3 mm right middle lobe lesion  MRI brain at [facility] 11/04/2018-progression of ependymal/intraventricular tumor in the body and frontal horn of the right lateral ventricle and chiasmatic recess of the third ventricle.    CT chest 11/19/2018-mild decrease in posterior right upper lobe nodule, other pulmonary nodules unchanged  CT chest 02/20/2019 -no evidence of progressive disease, stable right middle lobe nodules, decrease in size of a posterior right upper lobe nodule, no new nodules  CyberKnife to 4 lesions at [facility] 12/05/2018  MRI brain at [facility] complete resolution of enhancing lesions at the ependymal surfaces of the lateral ventricles bilaterally, persistent irregular enhancing lesion at the cerebellar vermis and posterior  margin of the fourth ventricle, no new lesions  2. Chronic history of mediastinal lymphadenopathy and pulmonary nodules-diagnostic workup in 2013 including bronchoscopy/EBUS , and mediastinoscopy -negative for malignancy, granulomatous changes noted  3. Hypertension  4. History of CAD  5. "Sweats "-most likely related to post menopausal hot flashes  6. Port-A-Cath placement 10/26/2015  7.Gait ataxia secondary to cerebellar edema/tumor versus radionecrosis     Disposition: Margaret Hale appears unchanged.  I suspect the neurologic deficits are secondary to the multiple treatments of the brain.  She requested we check a vitamin B12 level today.  She is scheduled for repeat imaging at [facility] in December.  She will return for an office visit and Port-A-Cath flush in approximately 7 weeks.  I recommend she continue physical therapy and Occupational Therapy skilled nursing facility.  Betsy Coder, MD  03/17/2019  11:49 AM

## 2019-03-21 ENCOUNTER — Encounter: Payer: Self-pay | Admitting: *Deleted

## 2019-03-21 NOTE — Progress Notes (Signed)
Faxed normal B12 result to [facility] at [phone] with note to please share this with patient.

## 2019-03-26 ENCOUNTER — Other Ambulatory Visit: Payer: Self-pay

## 2019-03-26 ENCOUNTER — Ambulatory Visit (HOSPITAL_COMMUNITY)
Admission: RE | Admit: 2019-03-26 | Discharge: 2019-03-26 | Disposition: A | Discharge status: Home / Self Care | Payer: Medicare Other | Source: Ambulatory Visit | Attending: Radiology | Admitting: Radiology

## 2019-03-26 DIAGNOSIS — C7931 Secondary malignant neoplasm of brain: Secondary | ICD-10-CM | POA: Diagnosis present

## 2019-03-26 MED ORDER — HEPARIN SOD (PORK) LOCK FLUSH 100 UNIT/ML IV SOLN
500.0000 [IU] | INTRAVENOUS | Status: AC | PRN
  Administered 2019-03-26: 500 [IU]
  Filled 2019-03-26: qty 5

## 2019-03-26 MED ORDER — GADOBUTROL 1 MMOL/ML IV SOLN
8.0000 mL | Freq: Once | INTRAVENOUS | Status: AC | PRN
  Administered 2019-03-26: 18:00:00 8 mL via INTRAVENOUS

## 2019-04-29 ENCOUNTER — Emergency Department (HOSPITAL_COMMUNITY)
Admission: EM | Admit: 2019-04-29 | Discharge: 2019-04-30 | Disposition: A | Discharge status: Home / Self Care | Payer: Medicare Other | Attending: Emergency Medicine | Admitting: Emergency Medicine

## 2019-04-29 ENCOUNTER — Encounter (HOSPITAL_COMMUNITY): Payer: Self-pay

## 2019-04-29 ENCOUNTER — Other Ambulatory Visit: Payer: Self-pay

## 2019-04-29 ENCOUNTER — Emergency Department (HOSPITAL_COMMUNITY): Payer: Medicare Other

## 2019-04-29 DIAGNOSIS — Z85841 Personal history of malignant neoplasm of brain: Secondary | ICD-10-CM | POA: Insufficient documentation

## 2019-04-29 DIAGNOSIS — Y939 Activity, unspecified: Secondary | ICD-10-CM | POA: Diagnosis not present

## 2019-04-29 DIAGNOSIS — Y92122 Bedroom in nursing home as the place of occurrence of the external cause: Secondary | ICD-10-CM | POA: Insufficient documentation

## 2019-04-29 DIAGNOSIS — W1830XA Fall on same level, unspecified, initial encounter: Secondary | ICD-10-CM | POA: Diagnosis not present

## 2019-04-29 DIAGNOSIS — S0181XA Laceration without foreign body of other part of head, initial encounter: Secondary | ICD-10-CM | POA: Insufficient documentation

## 2019-04-29 DIAGNOSIS — J449 Chronic obstructive pulmonary disease, unspecified: Secondary | ICD-10-CM | POA: Insufficient documentation

## 2019-04-29 DIAGNOSIS — Y999 Unspecified external cause status: Secondary | ICD-10-CM | POA: Diagnosis not present

## 2019-04-29 DIAGNOSIS — Z87891 Personal history of nicotine dependence: Secondary | ICD-10-CM | POA: Insufficient documentation

## 2019-04-29 DIAGNOSIS — E079 Disorder of thyroid, unspecified: Secondary | ICD-10-CM | POA: Diagnosis not present

## 2019-04-29 DIAGNOSIS — I2510 Atherosclerotic heart disease of native coronary artery without angina pectoris: Secondary | ICD-10-CM | POA: Diagnosis not present

## 2019-04-29 DIAGNOSIS — S0990XA Unspecified injury of head, initial encounter: Secondary | ICD-10-CM | POA: Insufficient documentation

## 2019-04-29 DIAGNOSIS — I10 Essential (primary) hypertension: Secondary | ICD-10-CM | POA: Insufficient documentation

## 2019-04-29 DIAGNOSIS — Z85118 Personal history of other malignant neoplasm of bronchus and lung: Secondary | ICD-10-CM | POA: Insufficient documentation

## 2019-04-29 DIAGNOSIS — Z79899 Other long term (current) drug therapy: Secondary | ICD-10-CM | POA: Diagnosis not present

## 2019-04-29 DIAGNOSIS — R296 Repeated falls: Secondary | ICD-10-CM

## 2019-04-29 NOTE — ED Triage Notes (Addendum)
Pt from [facility] for fall while ambulating. Staff reports pt was sitting in her room eating dinner when she was left alone she got up and fell. Staff states the pt is not suppose to walk alone.  Pt hit head on the floor, no blood thinners, hematoma and small laceration to left forehead.No LOC.  A.o, nad noted.

## 2019-04-30 NOTE — ED Notes (Signed)
Have attempted to call [place] without no answer

## 2019-04-30 NOTE — ED Notes (Signed)
Patient verbalizes understanding of discharge instructions. Opportunity for questioning and answers were provided. Armband removed by staff, pt discharged from ED with [facility] transport.

## 2019-04-30 NOTE — ED Notes (Signed)
Called [facility] for pt transport  

## 2019-04-30 NOTE — Discharge Instructions (Signed)
Continue to take Tylenol as needed for headaches and pain.  Do not exceed more than 4000 mg of Tylenol daily.  Drink plenty fluids and plenty of rest.  Follow-up with primary care provider for reevaluation of symptoms.  Return to the emergency department if any concerning signs or symptoms develop such as altered mental status, persistent vomiting, severe pain, or loss of consciousness.

## 2019-04-30 NOTE — ED Provider Notes (Signed)
[place] EMERGENCY DEPARTMENT Provider Note   CSN: [id number] Arrival date & time: 04/29/19  1848     History   Chief Complaint Chief Complaint  Patient presents with   Fall    HPI Margaret Hale is a 84 y.o. female with extensive past medical history as detailed below presents for evaluation after unwitnessed fall. She is here from [facility], where son says she was found after an unwitnessed fall. Per triage note, the patient was sitting eating in her room and when staff left the patient attempted to ambulate independently. Unsure of loss of consciousness. Her son notes that she has not been able to ambulate independently due to both weakness and balance issues for the last 6 months.  He states that she is typically transferred from a chair or bed into a wheelchair and that is how she gets around.  She is not on any blood thinners.  He also notes that for the last 3 to 4 weeks she has been progressively more confused, exhibiting dysarthric speech.  He states that she is seen weekly by a physician at the facility and this is being managed by them. No new medications and to his knowledge she is exhibiting good appetite and good oral intake. She has a history of multiple brain surgeries and radiation therapy for cancer so it is thought that her balance issues come from this.  The patient notes frontal headache, son says this is chronic for which she takes Tylenol frequently.  She denies any pain anywhere else.  No shortness of breath, nausea, vomiting, numbness or weakness of the extremities.  Son served as Optometrist throughout the encounter though she is able to communicate in [place] and understands most questions.      The history is provided by the patient, a relative, the EMS personnel and medical records. The history is limited by a language barrier. A language interpreter was used (patient's son served as Optometrist).    Past Medical History:  Diagnosis  Date   Adenomatous polyp 02/2009 &02/2012   Anxiety    Baker's cyst    L KNEE   Bulging lumbar disc 1990   CAD (coronary artery disease) 2007   PCI of RCA, bare metal stent.....Dr. Radford Pax   Cancer [facility])    lung mass   Disc disorder of lumbar region    2 BULDGING DISCS DX 1990 BY DR. DALLDORF   Dizzy    Hx of radiation therapy 2018   SRS Brain   Hyperlipidemia    Hypertension    Takes Novasc ,Clonidine, & Losartan   Lipoma of back    Menopausal flushing    Multiple thyroid nodules    No pertinent past medical history    lung mass  occas cough    Osteoporosis    Positive test for herpes simplex virus (HSV) antibody    Sinusitis, chronic    Spondylosis    NECK   Thyroid disease    nodules,stable on u/s 2/12   Vitamin D deficiency     Patient Active Problem List   Diagnosis Date Noted   Weakness generalized 03/04/2019   Bradycardia 03/04/2019   Dehydration 03/04/2019   Hypokalemia 03/04/2019   Port-A-Cath in place 07/15/2018   UTI (urinary tract infection) 11/19/2015   Port catheter in place 09/16/2015   Small cell carcinoma of right lung ([place]) 08/03/2015   Brain metastasis ([place]) 07/14/2015   S/P craniotomy 06/09/2015   Benign essential HTN 02/11/2014   Dyslipidemia  02/11/2014   Chest pain 02/11/2014   Restless leg syndrome 02/11/2014   COPD (chronic obstructive pulmonary disease) ([place]) 09/17/2011   Pulmonary nodules 09/13/2011   CAD (coronary artery disease)    Thyroid disease    Baker's cyst    Vitamin D deficiency    Disc disorder of lumbar region     Past Surgical History:  Procedure Laterality Date   CARPAL TUNNEL RELEASE  06/26/08        04/20/09   RIGHT HAND          LEFT HAND   CORONARY ANGIOPLASTY     dr h Tamala Julian [phone] with one stent   CRANIOTOMY Right 06/09/2015   Procedure: CRANIOTOMY TUMOR EXCISION WITH APPLICATION OF CRANIAL NAVIGATION;  Surgeon: Newman Pies, MD;  Location: [facility];   Service: Neurosurgery;  Laterality: Right;  Right temporal craniotomy for resection of tumor   EYE SURGERY  01/07/09   LEFT CATARACT   EYE SURGERY  01/21/09   RIGHT CATARACT   FINGER CONTRACTURE RELEASE  8/12    Dr. Amedeo Plenty   right hand trigger finger release   MEDIASTINOSCOPY  08/02/2011   Procedure: MEDIASTINOSCOPY;  Surgeon: Nicanor Alcon, MD;  Location: [facility] OR;  Service: Thoracic;  Laterality: N/A;   right power port placement Right      OB History   No obstetric history on file.      Home Medications    Prior to Admission medications   Medication Sig Start Date End Date Taking? Authorizing Provider  acetaminophen (TYLENOL) 325 MG tablet Take 2 tablets (650 mg total) by mouth every 6 (six) hours as needed for mild pain (or Fever >/= 101). 03/05/19   Geradine Girt, DO  amLODipine (NORVASC) 10 MG tablet Take 0.5 tablets (5 mg total) by mouth every evening. 03/05/19   Geradine Girt, DO  calcium carbonate (TUMS - DOSED IN MG ELEMENTAL CALCIUM) 500 MG chewable tablet Chew 2 tablets by mouth every 6 (six) hours as needed for indigestion or heartburn.    [provider]  CANNABIDIOL PO Take 0.5 mLs by mouth every 12 (twelve) hours as needed (pain).    [provider]  dexamethasone (DECADRON) 2 MG tablet Take 2 mg by mouth daily.    [provider]  famotidine (PEPCID) 20 MG tablet Take 20 mg by mouth 2 (two) times daily.    [provider]  guaiFENesin (MUCINEX) 600 MG 12 hr tablet Take 600 mg by mouth 2 (two) times daily. For 10 days Started on 10.3.2020    [provider]  labetalol (NORMODYNE) 100 MG tablet Take 0.5 tablets (50 mg total) by mouth 2 (two) times daily. 03/05/19   Geradine Girt, DO  lidocaine (XYLOCAINE) 5 % ointment Apply 1 application topically 3 (three) times daily as needed. Patient taking differently: Apply 1 application topically 3 (three) times daily as needed (for rib pain).  09/06/18   Long, Wonda Olds, MD    Multiple Vitamin (MULTIVITAMIN WITH MINERALS) TABS tablet Take 1 tablet by mouth daily.    [provider]  nitroGLYCERIN (NITROSTAT) 0.4 MG SL tablet Place 0.4 mg under the tongue every 5 (five) minutes as needed for chest pain.    [provider]  polyvinyl alcohol (LIQUIFILM TEARS) 1.4 % ophthalmic solution Place 1 drop into both eyes 3 (three) times daily.    [provider]  rOPINIRole (REQUIP) 1 MG tablet Take 2 mg by mouth at bedtime.  06/28/15  [provider]  sertraline (ZOLOFT) 50 MG tablet Take 50 mg by mouth daily.    [provider]  vitamin C (ASCORBIC ACID) 500 MG tablet Take 500 mg by mouth daily.    [provider]    Family History Family History  Problem Relation Age of Onset   Healthy Son    Healthy Son    Anesthesia problems Neg Hx    Hypotension Neg Hx    Malignant hyperthermia Neg Hx    Pseudochol deficiency Neg Hx     Social History Social History   Tobacco Use   Smoking status: Former Smoker    Packs/day: 0.50    Years: 10.00    Pack years: 5.00    Types: Cigarettes    Quit date: 07/12/2005    Years since quitting: 13.8   Smokeless tobacco: Never Used  Substance Use Topics   Alcohol use: No   Drug use: No     Allergies   Lipitor [atorvastatin calcium], Tape, Food, Pantoprazole sodium, and Salami [pickled meat]   Review of Systems Review of Systems  Eyes: Negative for photophobia.  Respiratory: Negative for shortness of breath.   Cardiovascular: Negative for chest pain.  Gastrointestinal: Negative for abdominal pain, nausea and vomiting.  Genitourinary: Negative for dysuria.  Musculoskeletal: Negative for back pain and neck pain.  Neurological: Positive for speech difficulty and headaches.  All other systems reviewed and are negative.    Physical Exam Updated Vital Signs BP (!) 135/101 (BP Location: Right Arm)    Pulse 88    Temp 97.8 F (36.6 C) (Oral)    Resp 16    SpO2  96%   Physical Exam Vitals signs and nursing note reviewed.  Constitutional:      General: She is not in acute distress.    Appearance: She is well-developed.     Comments: Resting comfortably in bed  HENT:     Head: Normocephalic.     Comments: No Battle's signs, no raccoon's eyes, no rhinorrhea. No hemotympanum. No tenderness to palpation of the face or skull. 2cm superficial laceration to the left side of the forehead with mild surrounding swelling. No active bleeding. Wound is not gaping. No deformity or crepitus noted. No jaw malocclusion   Eyes:     General:        Right eye: No discharge.        Left eye: No discharge.     Extraocular Movements: Extraocular movements intact.     Pupils: Pupils are equal, round, and reactive to light.  Neck:     Musculoskeletal: Normal range of motion and neck supple.     Vascular: No JVD.     Trachea: No tracheal deviation.     Comments: No midline spine TTP, no paraspinal muscle tenderness, no deformity, crepitus, or step-off noted  Cardiovascular:     Rate and Rhythm: Normal rate and regular rhythm.     Pulses: Normal pulses.     Comments: 2+ radial and DP/PT pulses bilaterally, no lower extremity edema.  Compartments are soft Pulmonary:     Effort: Pulmonary effort is normal.     Breath sounds: Normal breath sounds.     Comments: Equal rise and fall of chest.  No increased work of breathing and SPO2 saturations are 96% on room air.  Speaking in full sentences without difficulty.  No tenderness to palpation of the chest wall with no deformity, crepitus, ecchymosis, or flail segment noted. Chest:  Chest wall: No tenderness.  Abdominal:     General: Bowel sounds are normal. There is no distension.     Palpations: Abdomen is soft.     Tenderness: There is no abdominal tenderness. There is no guarding.     Comments: Atraumatic examination  Musculoskeletal:        General: No tenderness.     Comments: No midline spine TTP, no  paraspinal muscle tenderness, no deformity, crepitus, or step-off noted.  5/5 strength of BUE major muscle groups.  Normal passive range of motion of the upper and lower extremities.  No tenderness to palpation of the pelvis or application of compressive force anteriorly or laterally.  Skin:    General: Skin is warm and dry.     Capillary Refill: Capillary refill takes less than 2 seconds.     Findings: No erythema.  Neurological:     Mental Status: She is alert and oriented to person, place, and time. Mental status is at baseline.     Comments: Exhibits dysarthric speech, which son notes is chronic and has been ongoing for several months.  Patient oriented to person place and year.  Follows commands without difficulty.  Cranial nerves II through XII appear grossly intact.  Sensation intact to light touch of face and bilateral upper and lower extremities.  No pronator drift.  Moves extremities spontaneously without difficulty.  Some muscle wasting noted in the lower extremities bilaterally.  Psychiatric:        Behavior: Behavior normal.      ED Treatments / Results  Labs (all labs ordered are listed, but only abnormal results are displayed) Labs Reviewed - No data to display  EKG None  Radiology Ct Head Wo Contrast  Result Date: 04/29/2019 CLINICAL DATA:  84 year old female with fall. EXAM: CT HEAD WITHOUT CONTRAST TECHNIQUE: Contiguous axial images were obtained from the base of the skull through the vertex without intravenous contrast. COMPARISON:  Head CT dated 12/06/2018. FINDINGS: Brain: There is moderate age-related atrophy and chronic microvascular ischemic changes. Stable dilatation of the lateral ventricles similar to prior CT. There is dilatation of the ventricles out of proportion with the sulci which may represent central volume loss versus normal pressure hydrocephalus. Clinical correlation is recommended. Focal area of low attenuation in the cerebellar vermis similar to prior  CT. There is no acute intracranial hemorrhage. No mass effect or midline shift. No extra-axial fluid collection. Vascular: No hyperdense vessel or unexpected calcification. Skull: No acute calvarial pathology. Right parietal craniotomy. Sinuses/Orbits: No acute finding. Other: None IMPRESSION: 1. No acute intracranial hemorrhage. 2. Moderate age-related atrophy and chronic microvascular ischemic changes. Stable dilatation of the ventricular systems. Electronically Signed   By: Anner Crete M.D.   On: 04/29/2019 19:49    Procedures Procedures (including critical care time)  Medications Ordered in ED Medications - No data to display   Initial Impression / Assessment and Plan / ED Course  I have reviewed the triage vital signs and the nursing notes.  Pertinent labs & imaging results that were available during my care of the patient were reviewed by me and considered in my medical decision making (see chart for details).       Patient presenting from nursing home for evaluation after unwitnessed fall.  Appears she was left briefly unattended and attempted to stand and ambulate on her own which her son notes she has not been able to do at least for the last 6 months.  She has a complicated medical history  including multiple brain surgeries and radiation due to history of cancer and her oncologist feels that her chronic gait instability and headaches is secondary to that.  She is afebrile, vital signs are stable.  She is nontoxic in appearance.  No new focal neurologic deficits noted on examination.  She is oriented to person place and time.  She follows commands without difficulty.  She has a superficial laceration to the left forehead which does not require repair with sutures, Steri-Strips or Dermabond due to its superficiality.  Head CT shows no acute intracranial abnormalities or hemorrhage.  No signs of skull fracture.  No midline spine tenderness or tenderness to palpation of the chest,  abdomen, or pelvis.  Patient son reports that she is at mental status baseline.  She has been in our department for 12 hours prior to evaluation by myself and son notes that she has had no mental status change in that time.  Given known history of gait instability and inability to ambulate at baseline at this time suspect her fall was mechanical in nature.  No further emergent work-up required at this time.  Recommend follow-up with her PCP and on staff physician for reevaluation.  Her wound was cleaned and a sterile dressing was applied.  Discussed strict ED return precautions.  Patient son verbalized understanding of and agreement with plan and patient stable for discharge home at this time.  Patient was seen and evaluate Dr. Sedonia Small who agrees with assessment and plan at this time.  Final Clinical Impressions(s) / ED Diagnoses   Final diagnoses:  Unwitnessed fall  Injury of head, initial encounter    ED Discharge Orders    None       Renita Papa, PA-C 04/30/19 0757    Maudie Flakes, MD 05/02/19 [phone]

## 2019-05-05 ENCOUNTER — Inpatient Hospital Stay: Payer: Medicare Other | Attending: Oncology

## 2019-05-05 ENCOUNTER — Inpatient Hospital Stay: Payer: Medicare Other

## 2019-05-05 ENCOUNTER — Inpatient Hospital Stay: Payer: Medicare Other | Admitting: Oncology

## 2019-05-06 ENCOUNTER — Other Ambulatory Visit: Payer: Self-pay

## 2019-05-06 ENCOUNTER — Non-Acute Institutional Stay: Payer: Medicare Other | Admitting: Hospice

## 2019-05-06 DIAGNOSIS — Z515 Encounter for palliative care: Secondary | ICD-10-CM

## 2019-05-06 DIAGNOSIS — C3491 Malignant neoplasm of unspecified part of right bronchus or lung: Secondary | ICD-10-CM

## 2019-05-06 NOTE — Progress Notes (Addendum)
[facility] Consult Note Telephone: [phone]  Fax: [phone]  PATIENT NAME: Margaret Hale DOB: Sep 02, 1934 MRN: [id number]  PRIMARY CARE PROVIDER:   Stark Jock NP  REFERRING PROVIDER:  Stark Jock NP  RESPONSIBLE PARTY:   Elly Modena, son [phone]   TELEHEALTH VISIT STATEMENT Due to the COVID-19 crisis, this visit was done via telephone from my office. It was initiated and consented to by this patient and/or family. Call facilitated by facility nursing staff Darius.  RECOMMENDATIONS/PLAN:   Advance Care Planning/Goals of Care: Patient is a DNR, with goals of care including to maximize quality of life and symptom management. NP received hospice referral request from facility;  son aware and visited patient in the facility for a compassionate visit. Contact [facility] CMO who affirmed patient is eligible. Symptom management: Ongoing decline in functional status, now bed-bound PPS 30%, down from 50% about 6 months ago. Patient with history of malignant neoplasm of unspecified bronchus of Lungs, with mets to the brain. Patient no longer receiving any aggressive treatment.ffatient denied shortness of breath; endorsed mild to moderate pain to bilateral shoulder, of which her PRN Tylenol recommended to nursing staff for administration to patient.  Follow up: Palliative care will continue to follow patient for goals of care clarification and symptom management. I spent 60 minutes providing this consultation, from 11am to 12pm. More than 50% of the time in this consultation was spent on coordinating communication.   HISTORY OF PRESENT ILLNESS:  Margaret Hale is a 84 y.o. year old female with multiple medical problems including right lung cancer with mets to brain, HTN, COPD, CAD, thyroid disease. Palliative Care was asked to help address goals of care.  CODE STATUS: DNR  PPS: 30% HOSPICE ELIGIBILITY/DIAGNOSIS: TBD  PAST  MEDICAL HISTORY:  Past Medical History:  Diagnosis Date  . Adenomatous polyp 02/2009 &02/2012  . Anxiety   . Baker's cyst    L KNEE  . Bulging lumbar disc 1990  . CAD (coronary artery disease) 2007   PCI of RCA, bare metal stent.....Dr. Radford Pax  . Cancer (HCC)    lung mass  . Disc disorder of lumbar region    2 BULDGING DISCS DX 1990 BY DR. DALLDORF  . Dizzy   . Hx of radiation therapy 2018   [facility]  . Hyperlipidemia   . Hypertension    Takes Novasc ,Clonidine, & Losartan  . Lipoma of back   . Menopausal flushing   . Multiple thyroid nodules   . No pertinent past medical history    lung mass  occas cough   . Osteoporosis   . Positive test for herpes simplex virus (HSV) antibody   . Sinusitis, chronic   . Spondylosis    NECK  . Thyroid disease    nodules,stable on u/s 2/12  . Vitamin D deficiency     SOCIAL HX:  Social History   Tobacco Use  . Smoking status: Former Smoker    Packs/day: 0.50    Years: 10.00    Pack years: 5.00    Types: Cigarettes    Quit date: 07/12/2005    Years since quitting: 13.8  . Smokeless tobacco: Never Used  Substance Use Topics  . Alcohol use: No    ALLERGIES:  Allergies  Allergen Reactions  . Lipitor [Atorvastatin Calcium] Other (See Comments)    MYALGIAS  . Tape Other (See Comments)    Per MAR  . Food Rash and Other (See Comments)  Hot Dogs  . Pantoprazole Sodium Rash  . [place] Brownie Meat] Rash     PERTINENT MEDICATIONS:  Outpatient Encounter Medications as of 05/06/2019  Medication Sig  . acetaminophen (TYLENOL) 325 MG tablet Take 2 tablets (650 mg total) by mouth every 6 (six) hours as needed for mild pain (or Fever >/= 101).  Marland Kitchen amLODipine (NORVASC) 10 MG tablet Take 0.5 tablets (5 mg total) by mouth every evening.  . calcium carbonate (TUMS - DOSED IN MG ELEMENTAL CALCIUM) 500 MG chewable tablet Chew 2 tablets by mouth every 6 (six) hours as needed for indigestion or heartburn.  Marland Kitchen CANNABIDIOL PO Take 0.5 mLs by  mouth every 12 (twelve) hours as needed (pain).  Marland Kitchen dexamethasone (DECADRON) 2 MG tablet Take 2 mg by mouth daily.  . famotidine (PEPCID) 20 MG tablet Take 20 mg by mouth 2 (two) times daily.  Marland Kitchen guaiFENesin (MUCINEX) 600 MG 12 hr tablet Take 600 mg by mouth 2 (two) times daily. For 10 days Started on 10.3.2020  . labetalol (NORMODYNE) 100 MG tablet Take 0.5 tablets (50 mg total) by mouth 2 (two) times daily.  Marland Kitchen lidocaine (XYLOCAINE) 5 % ointment Apply 1 application topically 3 (three) times daily as needed. (Patient taking differently: Apply 1 application topically 3 (three) times daily as needed (for rib pain). )  . Multiple Vitamin (MULTIVITAMIN WITH MINERALS) TABS tablet Take 1 tablet by mouth daily.  . nitroGLYCERIN (NITROSTAT) 0.4 MG SL tablet Place 0.4 mg under the tongue every 5 (five) minutes as needed for chest pain.  . polyvinyl alcohol (LIQUIFILM TEARS) 1.4 % ophthalmic solution Place 1 drop into both eyes 3 (three) times daily.  Marland Kitchen rOPINIRole (REQUIP) 1 MG tablet Take 2 mg by mouth at bedtime.   . sertraline (ZOLOFT) 50 MG tablet Take 50 mg by mouth daily.  . vitamin C (ASCORBIC ACID) 500 MG tablet Take 500 mg by mouth daily.   No facility-administered encounter medications on file as of 05/06/2019.     Teodoro Spray, NP

## 2019-05-28 ENCOUNTER — Telehealth: Payer: Self-pay | Admitting: *Deleted

## 2019-05-28 NOTE — Telephone Encounter (Signed)
Left VM asking if she needs port flushed. Patient's son is insisting this is necessary. Called back and left VM that she is past due for port flush and it needs to be done soon. How would she like to have this scheduled--with facility or with son?

## 2019-05-29 NOTE — Telephone Encounter (Signed)
Margaret Hale, DON and Gayla Medicus, RN not in office today. Left VM that if hospice is following her in the facility, they may be willing to flush her port. Suggested they reach out to Hospice nurse. If not, they need to call and make a flush appointment for her. Suggested they call Dr. Vallarie Mare at [facility] to confirm if he still needs repeat scans.

## 2019-06-30 DEATH — deceased
